# Patient Record
Sex: Female | Born: 1980 | Race: Black or African American | Hispanic: No | Marital: Married | Smoking: Never smoker
Health system: Southern US, Community
[De-identification: ages and names within clinical notes are randomized; demographics above are authoritative.]

## PROBLEM LIST (undated history)

## (undated) DIAGNOSIS — R600 Localized edema: Secondary | ICD-10-CM

## (undated) DIAGNOSIS — F419 Anxiety disorder, unspecified: Secondary | ICD-10-CM

## (undated) DIAGNOSIS — I10 Essential (primary) hypertension: Secondary | ICD-10-CM

## (undated) DIAGNOSIS — E785 Hyperlipidemia, unspecified: Secondary | ICD-10-CM

## (undated) DIAGNOSIS — E119 Type 2 diabetes mellitus without complications: Secondary | ICD-10-CM

## (undated) DIAGNOSIS — F32A Depression, unspecified: Secondary | ICD-10-CM

## (undated) DIAGNOSIS — Z6841 Body Mass Index (BMI) 40.0 and over, adult: Secondary | ICD-10-CM

## (undated) DIAGNOSIS — E669 Obesity, unspecified: Secondary | ICD-10-CM

## (undated) DIAGNOSIS — E282 Polycystic ovarian syndrome: Secondary | ICD-10-CM

## (undated) DIAGNOSIS — D649 Anemia, unspecified: Secondary | ICD-10-CM

## (undated) DIAGNOSIS — E559 Vitamin D deficiency, unspecified: Secondary | ICD-10-CM

## (undated) DIAGNOSIS — Z9884 Bariatric surgery status: Secondary | ICD-10-CM

## (undated) DIAGNOSIS — F429 Obsessive-compulsive disorder, unspecified: Secondary | ICD-10-CM

## (undated) DIAGNOSIS — I8390 Asymptomatic varicose veins of unspecified lower extremity: Secondary | ICD-10-CM

## (undated) HISTORY — DX: Obsessive-compulsive disorder, unspecified: F42.9

## (undated) HISTORY — DX: Depression, unspecified: F32.A

## (undated) HISTORY — DX: Obesity, unspecified: E66.9

## (undated) HISTORY — DX: Hyperlipidemia, unspecified: E78.5

## (undated) HISTORY — PX: LAPAROSCOPIC GASTRIC SLEEVE RESECTION: SHX5895

## (undated) HISTORY — DX: Bariatric surgery status: Z98.84

## (undated) HISTORY — DX: Essential (primary) hypertension: I10

## (undated) HISTORY — DX: Anemia, unspecified: D64.9

## (undated) HISTORY — DX: Asymptomatic varicose veins of unspecified lower extremity: I83.90

## (undated) HISTORY — DX: Anxiety disorder, unspecified: F41.9

## (undated) HISTORY — DX: Type 2 diabetes mellitus without complications: E11.9

## (undated) HISTORY — DX: Polycystic ovarian syndrome: E28.2

## (undated) HISTORY — DX: Localized edema: R60.0

## (undated) HISTORY — DX: Vitamin D deficiency, unspecified: E55.9

## (undated) HISTORY — DX: Morbid (severe) obesity due to excess calories: E66.01

## (undated) HISTORY — DX: Body Mass Index (BMI) 40.0 and over, adult: Z684

## (undated) HISTORY — PX: WISDOM TOOTH EXTRACTION: SHX21

---

## 2008-06-11 ENCOUNTER — Emergency Department (HOSPITAL_COMMUNITY): Admission: EM | Admit: 2008-06-11 | Discharge: 2008-06-12 | Payer: Self-pay | Admitting: Emergency Medicine

## 2008-11-17 ENCOUNTER — Encounter: Admission: RE | Admit: 2008-11-17 | Discharge: 2008-11-17 | Payer: Self-pay | Admitting: Internal Medicine

## 2009-04-04 ENCOUNTER — Ambulatory Visit: Payer: Self-pay | Admitting: Vascular Surgery

## 2010-06-05 NOTE — Procedures (Signed)
LOWER EXTREMITY VENOUS REFLUX EXAM   INDICATION:  Left lower extremity edema.   EXAM:  Using color-flow imaging and pulse Doppler spectral analysis, the  left common femoral, superficial femoral, popliteal, posterior tibial,  greater and lesser saphenous veins are evaluated.  There is evidence  suggesting deep venous insufficiency in the left common femoral and  popliteal veins.   The left saphenofemoral junction and greater saphenous vein are not  competent with Reflux of >552milliseconds.   The left proximal short saphenous vein demonstrates competency.   GSV Diameter (used if found to be incompetent only)                                            Right    Left  Proximal Greater Saphenous Vein           cm       0.76 cm  Proximal-to-mid-thigh                     cm       cm  Mid thigh                                 cm       0.72 cm  Mid-distal thigh                          cm       cm  Distal thigh                              cm       0.60 cm  Knee                                      cm       0.58 cm   IMPRESSION:  1. Left greater saphenous vein Reflux with >554milliseconds noted, as      described above.  2. The proximal to mid thigh left greater saphenous vein is not      tortuous.  The distal thigh and knee level left greater saphenous      vein is tortuous.  3. The left deep venous system is not competent with Reflux of      >592milliseconds, as described above.  4. The left proximal short saphenous vein demonstrates competency.            ___________________________________________  Quita Skye Hart Rochester, M.D.   CH/MEDQ  D:  04/04/2009  T:  04/05/2009  Job:  [id number]

## 2010-06-05 NOTE — Consult Note (Signed)
NEW PATIENT CONSULTATION   Kendra Nguyen, Kendra Nguyen  DOB:  May 29, 1980                                       04/04/2009  ZOXWR#:[id number]   The patient is a 30 year old female referred for venous insufficiency of  the left leg.  She states that in October of last year she began having  some occasional thigh and calf discomfort particularly around the knee  which was very sporadic and occasionally would be improved by elevation  of the legs.  She would also have some mild swelling in the ankle.  She  has had no history of DVT, thrombophlebitis, bleeding, ulceration and  has not worn elastic compression stockings nor taken pain medication.  She states that recently the symptoms have actually decreased and have  not been bad over the last few months.  She has no symptoms in the right  leg.   CHRONIC MEDICAL PROBLEMS:  1. Morbid obesity.  2. Hypertension.  3. Negative for diabetes, hyperlipidemia, coronary artery disease,      COPD or stroke.   FAMILY HISTORY:  Positive for coronary artery disease, possible  pulmonary embolus and diabetes mellitus in her mother.   SOCIAL HISTORY:  She is single, works as Pension scheme manager.  Does not use tobacco or alcohol.   REVIEW OF SYSTEMS:  Positive for weight gain.  Denies any chest pain,  dyspnea on exertion, PND, orthopnea.  No hemoptysis, bronchitis,  wheezing.  No GI or GU symptoms.  Denies any claudication.  Neurologic  and musculoskeletal systems and all systems are negative in review of  systems other than her inability to lose weight.   PHYSICAL EXAMINATION:  Vital signs:  Blood pressure is 150/78, heart  rate is 90, respirations 14, temperature 98.4.  General:  She is a well-  developed, obese female who is in no apparent distress.  She is alert  and oriented x3.  HEENT:  EOMs intact.  Conjunctivae normal.  Chest:  Clear to auscultation.  No wheezing.  Cardiovascular:  Carotid pulses  are 3+.  No bruits.   Regular rate and rhythm.  No murmurs audible.  Abdomen:  Soft, nontender with no masses.  Musculoskeletal:  Exam  reveals no major deformities.  Neurological:  Normal.  Skin:  Free of  rashes.  Lower extremity exam reveals 3+ femoral and dorsalis pedis  pulses bilaterally.  She does have an extensive network of spider veins  in the distal thigh and proximal calf medially in the left leg but no  bulging varicosities are noted.  No hyperpigmentation or ulceration  noted.   Venous duplex exam today was ordered by me and reviewed and interpreted.  She does have reflux in her left great saphenous system at the junction  and in the distal thigh.  The deep system has some mild reflux but no  DVT noted.  There is no reflux in the small saphenous system on the  left.   She does have some mild reflux but I do not think that her symptoms are  severe enough to warrant any treatment since she has no bulging  varicosities.  Also I have encouraged her to continue to work on weight  loss as she is trying to do as this will help her situation.  If she  develops bulging varicosities or has increasing symptoms over the years  then studies  should be repeated at that time.     Quita Skye Hart Rochester, M.D.  Electronically Signed   JDL/MEDQ  D:  04/04/2009  T:  04/05/2009  Job:  [id number]   cc:   Merlene Laughter. Renae Gloss, M.D.

## 2011-03-21 ENCOUNTER — Ambulatory Visit (INDEPENDENT_AMBULATORY_CARE_PROVIDER_SITE_OTHER): Payer: BC Managed Care – PPO | Admitting: Family Medicine

## 2011-03-21 ENCOUNTER — Ambulatory Visit: Payer: BC Managed Care – PPO

## 2011-03-21 DIAGNOSIS — E669 Obesity, unspecified: Secondary | ICD-10-CM

## 2011-03-21 DIAGNOSIS — I10 Essential (primary) hypertension: Secondary | ICD-10-CM | POA: Insufficient documentation

## 2011-03-21 DIAGNOSIS — R079 Chest pain, unspecified: Secondary | ICD-10-CM

## 2011-03-21 MED ORDER — DOXYCYCLINE HYCLATE 100 MG PO CAPS: 100.0000 mg | ORAL_CAPSULE | Freq: Two times a day (BID) | ORAL | Status: AC

## 2011-03-21 NOTE — Progress Notes (Signed)
Patient Name: Kendra Nguyen Date of Birth: June 25, 1980 Medical Record Number: [id number] Gender: female Date of Encounter: 03/21/2011  History of Present Illness:  Kendra Nguyen is a 31 y.o. very pleasant female patient who presents with the following:  Here today to evaluate chest pain.  On Monday did Zumba class and noted chest discomfort for a few minutes at the start of class- wondered if it was due to moving her arms around a lot.  It went away and she continued the class without any problem. That night noted pain that occurred with position change like when she tried to get out of bed.  Yesterday afternoon she noted the pain again while she was sitting in a meeting.  The pain lasted about 5 minutes, then came back on and off yesterday.  Today notes the pain only if she moves a certain way- it feels like a "pull." Occurs if she reaches out with her right arm Also has been coughing some lately- notes that she coughs up "some stuff" which she attributes to sinus drainage.  She was treeated elsewhere about a month ago with a zpack but this has not gone away No fever, unsure about chills.   No SOB Wants a chest xray.  LMP at the start of February.  Also notes "random pains" all over her body for the last several years.   In the fall of 2010 she had an echo and visit with cardiology for atypical CP.  No stress test was done due to very low likelihood of cardiac disease.  She does have a brother who had an MI at age 17, but Jayd notes that there was "something else going on with him" that was through to cause the MI Not a smoker.   Is having a lap band next month! Patient Active Problem List  Diagnoses  . Morbid obesity  . Hypertension   History reviewed. No pertinent past medical history. Past Surgical History  Procedure Date  . Ankle fracture surgery    History  Substance Use Topics  . Smoking status: Never Smoker   . Smokeless tobacco: Never Used  . Alcohol Use: Not on file    History reviewed. No pertinent family history. Allergies  Allergen Reactions  . Neomycin Rash    Medication list has been reviewed and updated.  Review of Systems: As per HPI- otherwise negative.   Physical Examination: Filed Vitals:   03/21/11 1740  BP: 113/72  Pulse: 85  Temp: 98.3 F (36.8 C)  TempSrc: Oral  Resp: 16  Height: 5\' 7"  (1.702 m)  Weight: 329 lb (149.233 kg)    Body mass index is 51.53 kg/(m^2).  Chest x-ray- negtive EKG: normal sinus rhythm, no ST elevation or depression, compared to old EKG and no significant change GEN: WDWN, NAD, Non-toxic, A & O x 3 HEENT: Atraumatic, Normocephalic. Neck supple. No masses, No LAD. Ears and Nose: No external deformity. CV: RRR, No M/G/R. No JVD. No thrill. No extra heart sounds. PULM: CTA B, no wheezes, crackles, rhonchi. No retractions. No resp. distress. No accessory muscle use. Chest wall: somewhat able to reproduce her tenderness with pushing on chest.  No rashes or lesions.  ABD: S, NT, ND, +BS. No rebound. No HSM. EXTR: No c/c/e NEURO Normal gait.  PSYCH: Normally interactive. Conversant. Not depressed or anxious appearing.  Calm demeanor.    Assessment and Plan: 1. Chest pain  EKG 12-Lead, DG Chest 2 View, doxycycline (VIBRAMYCIN) 100 MG capsule  2. Morbid obesity  3. Hypertension      Discussed CP in detail with patient.  Due to her age, lack of current CP and normal EKG I do not feel that she needs to go to the ED.  Her CP is atypical and she did have a cardiology evaluation in 2010.  She has not had a stress test or cath, but we discussed how a heart cath could have adverse effects, and how a stress could lead to a cath.  For the time being she will see if this resolves in the next couple of days. If it does not she will come in and see [place]- she will seek care at the ED if her CP becomes acutely worse or lasts.    She also has complaint of persistent cough which is productive despite treatment with a  zpack about a month ago.  Will try a course of doxycycine- let us know if not better or if worse

## 2011-04-08 ENCOUNTER — Encounter: Payer: Self-pay | Admitting: Obstetrics and Gynecology

## 2011-04-17 HISTORY — PX: LAPAROSCOPIC GASTRIC BANDING: SHX1100

## 2011-05-13 ENCOUNTER — Ambulatory Visit (INDEPENDENT_AMBULATORY_CARE_PROVIDER_SITE_OTHER): Payer: BC Managed Care – PPO | Admitting: Obstetrics and Gynecology

## 2011-05-13 ENCOUNTER — Encounter: Payer: Self-pay | Admitting: Obstetrics and Gynecology

## 2011-05-13 VITALS — BP 118/78 | Temp 98.4°F | Resp 16 | Ht 64.0 in | Wt 310.0 lb

## 2011-05-13 DIAGNOSIS — Z01419 Encounter for gynecological examination (general) (routine) without abnormal findings: Secondary | ICD-10-CM

## 2011-05-13 DIAGNOSIS — Z113 Encounter for screening for infections with a predominantly sexual mode of transmission: Secondary | ICD-10-CM

## 2011-05-13 DIAGNOSIS — Z124 Encounter for screening for malignant neoplasm of cervix: Secondary | ICD-10-CM

## 2011-05-13 LAB — RPR

## 2011-05-13 NOTE — Patient Instructions (Signed)

## 2011-05-13 NOTE — Progress Notes (Signed)
Patient ID: Samar Venneman, female   DOB: [birth year], 31 y.o.   MRN: [id number] .Allergies: Neomycin LMP: April 25, 2011 Last Pap: 03/2010- Normal Contraception: Abstinent Gravida: 0 Para: 0 Primary Doctor: Dr. Kendell Bane EXAM  Regular Periods yes   Monthly Breast Ex. no Tetanus<10 years yes NI. Bladder Function yes Daily BM's yes Healthy Diet yes Calcium/Multi-vitamin no Mammogram no Date: Smoker no How much? Alcohol Abuse no How much? Substance Abuse no How much? Exercise yes How much?  3x's a week Seatbelts yes Abuse at Home no Stressful Work yes Sigmoid/Colonoscopy in past 5 years no Medications:  Medical problems this year: No  PMH: High blood pressure & Lapband surgery  FMH: Sister had hysterectomy  ABDOMINAL/PELVIC PAIN no  VAGINAL DISCHARGE no  IRREGULAR BLEEDING no  MENOPAUSAL SYMPTOMS no

## 2011-05-14 NOTE — Progress Notes (Signed)
Kendra Nguyen is an 32 y.o. female. G 0 P 0-0-0-0.  She has been followed at the [facility]. New GYN exam. Not sexually active.  Pertinent Gynecological History: Menses: flow is moderate Bleeding: reg Contraception: abstinence DES exposure: unknown Blood transfusions: none Sexually transmitted diseases: no past history Previous GYN Procedures: none  Last mammogram: na Date: na Last pap: normal   Menstrual History: Menarche age: normal     Past Medical History  Diagnosis Date  . Hypertension   . Hx of laparoscopic gastric banding     Past Surgical History  Procedure Date  . Ankle fracture surgery     No family history on file.  Social History:  reports that she has never smoked. She has never used smokeless tobacco. She reports that she does not drink alcohol or use illicit drugs.  Allergies:  Allergies  Allergen Reactions  . Neomycin Rash    ROS: see hpi   Blood pressure 118/78, temperature 98.4 F (36.9 C), resp. rate 16, height 5\' 4"  (1.626 m), weight 310 lb (140.615 kg). Physical Examination: General appearance - alert, well appearing, and in no distress Neck - supple, no significant adenopathy Chest - clear to auscultation, no wheezes, rales or rhonchi, symmetric air entry Heart - normal rate, regular rhythm, normal S1, S2, no murmurs, rubs, clicks or gallops Abdomen - soft, nontender, nondistended, no masses or organomegaly Breasts - breasts appear normal, no suspicious masses, no skin or nipple changes or axillary nodes Pelvic - normal external genitalia, vulva, vagina, cervix, uterus and adnexa, VULVA: normal appearing vulva with no masses, tenderness or lesions, VAGINA: normal appearing vagina with normal color and discharge, no lesions, CERVIX: normal appearing cervix without discharge or lesions, UTERUS: uterus is normal size, shape, consistency and nontender, ADNEXA: normal adnexa in size, nontender  and no masses Neurological - alert, oriented, normal speech, no focal findings or movement disorder noted Extremities - peripheral pulses normal, no pedal edema, no clubbing or cyanosis  Assessment/Plan: Normal gyn exam R/O STD Obesity Htn S/P gatric bypass surgery  Pap GC,CHL,HBsAG, HSV1 and 2, HIV, RPR Contraception discussed RTO 1 year.  Janine Limbo 05/14/2011, 10:47 PM

## 2011-05-15 LAB — PAP IG, CT-NG, RFX HPV ASCU
Chlamydia Probe Amp: NEGATIVE
GC Probe Amp: NEGATIVE

## 2011-05-28 ENCOUNTER — Telehealth: Payer: Self-pay | Admitting: Obstetrics and Gynecology

## 2011-05-28 NOTE — Telephone Encounter (Signed)
Triage/electronic lab res

## 2011-05-29 ENCOUNTER — Telehealth: Payer: Self-pay

## 2011-05-29 NOTE — Telephone Encounter (Signed)
Pt was called and made aware that all Labs came back negative.

## 2011-05-29 NOTE — Telephone Encounter (Signed)
AVS PT;SEE NOTE

## 2011-06-11 ENCOUNTER — Ambulatory Visit: Payer: BC Managed Care – PPO

## 2011-07-08 ENCOUNTER — Ambulatory Visit (INDEPENDENT_AMBULATORY_CARE_PROVIDER_SITE_OTHER): Payer: BC Managed Care – PPO | Admitting: Emergency Medicine

## 2011-07-08 VITALS — BP 127/77 | HR 59 | Temp 98.0°F | Resp 16 | Ht 66.0 in | Wt 294.0 lb

## 2011-07-08 DIAGNOSIS — B372 Candidiasis of skin and nail: Secondary | ICD-10-CM

## 2011-07-08 MED ORDER — FLUCONAZOLE 100 MG PO TABS: 100.0000 mg | ORAL_TABLET | Freq: Every day | ORAL | Status: AC

## 2011-07-08 NOTE — Progress Notes (Signed)
     Patient Name: Kendra Nguyen Date of Birth: [birth year] Medical Record Number: [id number] Gender: female Date of Encounter: 07/08/2011  History of Present Illness:  Kendra Nguyen is a 31 y.o. very pleasant female patient who presents with the following:  Recurrent pruritic rash under left arm associated with redundant skin folds following large weight loss  Patient Active Problem List  Diagnosis  . Morbid obesity  . Hypertension   Past Medical History  Diagnosis Date  . Hypertension   . Hx of laparoscopic gastric banding    Past Surgical History  Procedure Date  . Ankle fracture surgery    History  Substance Use Topics  . Smoking status: Never Smoker   . Smokeless tobacco: Never Used  . Alcohol Use: No   No family history on file. Allergies  Allergen Reactions  . Neomycin Rash    Medication list has been reviewed and updated.  Prior to Admission medications   Medication Sig Start Date End Date Taking? Authorizing Provider  olmesartan-hydrochlorothiazide (BENICAR HCT) 20-12.5 MG per tablet Take 1 tablet by mouth daily.   Yes Historical Provider, MD  nebivolol (BYSTOLIC) 10 MG tablet Take 10 mg by mouth daily.    Historical Provider, MD    Review of Systems:  As per HPI, otherwise negative.    Physical Examination: Filed Vitals:   07/08/11 1224  BP: 127/77  Pulse: 59  Temp: 98 F (36.7 C)  Resp: 16   Filed Vitals:   07/08/11 1224  Height: 5\' 6"  (1.676 m)  Weight: 294 lb (133.358 kg)   Body mass index is 47.45 kg/(m^2). Ideal Body Weight: Weight in (lb) to have BMI = 25: 154.6    GEN: WDWN, NAD, Non-toxic, Alert & Oriented x 3 HEENT: Atraumatic, Normocephalic.  Ears and Nose: No external deformity. EXTR: No clubbing/cyanosis/edema.  Multiple discrete lesions surrounding larger erythematous area  NEURO: Normal gait.  PSYCH: Normally interactive. Conversant. Not depressed or anxious appearing.  Calm demeanor.   Assessment and  Plan: Candidal intertrigo treated with diflucan Follow up as needed  Carmelina Dane, MD

## 2011-07-18 ENCOUNTER — Encounter: Payer: Self-pay | Admitting: Physician Assistant

## 2011-07-18 ENCOUNTER — Ambulatory Visit (INDEPENDENT_AMBULATORY_CARE_PROVIDER_SITE_OTHER): Payer: BC Managed Care – PPO | Admitting: Physician Assistant

## 2011-07-18 VITALS — BP 115/76 | HR 71 | Temp 98.2°F | Resp 16 | Ht 66.5 in | Wt 294.2 lb

## 2011-07-18 DIAGNOSIS — L309 Dermatitis, unspecified: Secondary | ICD-10-CM

## 2011-07-18 DIAGNOSIS — L259 Unspecified contact dermatitis, unspecified cause: Secondary | ICD-10-CM

## 2011-07-18 DIAGNOSIS — L738 Other specified follicular disorders: Secondary | ICD-10-CM

## 2011-07-18 DIAGNOSIS — L678 Other hair color and hair shaft abnormalities: Secondary | ICD-10-CM

## 2011-07-18 DIAGNOSIS — L739 Follicular disorder, unspecified: Secondary | ICD-10-CM

## 2011-07-18 MED ORDER — DOXYCYCLINE HYCLATE 100 MG PO CAPS: 100.0000 mg | ORAL_CAPSULE | Freq: Two times a day (BID) | ORAL | Status: AC

## 2011-07-18 NOTE — Progress Notes (Signed)
  Subjective:    Patient ID: Kendra Nguyen, female    DOB: 1980/06/21, 31 y.o.   MRN: [id number]  HPI Presents with worsening rash and itching in the right left axilla.  Seen 10 days ago and diagnosed with candida intertrigo and given Diflucan PO.  Seems to be spreading.  Deodorant burns when applied.  New lesions are developing in the axilla, and extending onto the left breast and upper back.  She is working hard at losing weight.  Has lost 40 pounds since gastric banding in March.  Exercises daily.  Has developed areas of excess skin, especially in the upper arms.  Review of Systems No fever, chills, no drainage from lesions.    Objective:   Physical Exam Vital signs noted. Well-developed, well nourished BF who is awake, alert and oriented, in NAD. HEENT: [place]/AT, sclera and conjunctiva are clear.  Skin: warm and dry.  Two different lesion types noted in the left axilla.  One is scaled, plaques, consistent with cutaneous candidiasis intertrigo.  The other is small erythematous papules and pustules that are scattered around the axilla and spreads onto the lateral breast and back.  No induration.  No fluctuence.     Assessment & Plan:   1. Dermatitis    2. Folliculitis  doxycycline (VIBRAMYCIN) 100 MG capsule   Continue the fluconazole to completion.  Dicussed ways to keep the area cool and dry and to reduce friction.

## 2011-07-18 NOTE — Patient Instructions (Signed)
Complete the Diflucan (fluconazole) and ADD the doxycycline. Try a product called Glide in the areas that chafe and rub during exercise.

## 2011-10-17 ENCOUNTER — Ambulatory Visit (INDEPENDENT_AMBULATORY_CARE_PROVIDER_SITE_OTHER): Payer: BC Managed Care – PPO | Admitting: Emergency Medicine

## 2011-10-17 ENCOUNTER — Telehealth: Payer: Self-pay | Admitting: Radiology

## 2011-10-17 ENCOUNTER — Ambulatory Visit (HOSPITAL_COMMUNITY)
Admission: RE | Admit: 2011-10-17 | Discharge: 2011-10-17 | Disposition: A | Discharge status: Home / Self Care | Payer: BC Managed Care – PPO | Source: Ambulatory Visit | Attending: Emergency Medicine | Admitting: Emergency Medicine

## 2011-10-17 VITALS — BP 128/74 | HR 88 | Temp 98.5°F | Resp 17 | Ht 66.0 in | Wt 293.0 lb

## 2011-10-17 DIAGNOSIS — M79606 Pain in leg, unspecified: Secondary | ICD-10-CM

## 2011-10-17 DIAGNOSIS — M79609 Pain in unspecified limb: Secondary | ICD-10-CM | POA: Insufficient documentation

## 2011-10-17 DIAGNOSIS — M7989 Other specified soft tissue disorders: Secondary | ICD-10-CM | POA: Insufficient documentation

## 2011-10-17 DIAGNOSIS — I10 Essential (primary) hypertension: Secondary | ICD-10-CM

## 2011-10-17 NOTE — Progress Notes (Signed)
  Date:  10/17/2011   Name:  Kendra Nguyen   DOB:  14-Aug-1980   MRN:  [id number] Gender: female Age: 31 y.o.  PCP:  Alva Garnet., MD    Chief Complaint: Leg Pain   History of Present Illness:  Kendra Nguyen is a 31 y.o. pleasant patient who presents with the following:  Multiple concerns related to cardiovascular system.  Says that she has been diagnosed with varicose veins and is concerned about them as her mother died of a PE.  Not using recommended support hose.  Underwent gastric surgery for morbid obesity and has lost 50 pounds.  Denies shortness of breath or chest pain.  No peripheral edema.  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  No other risks for DVT/PE  Patient Active Problem List  Diagnosis  . Morbid obesity  . Hypertension    Past Medical History  Diagnosis Date  . Hypertension   . Hx of laparoscopic gastric banding     Past Surgical History  Procedure Date  . Ankle fracture surgery   . Laparoscopic gastric banding 04/17/2011    History  Substance Use Topics  . Smoking status: Never Smoker   . Smokeless tobacco: Never Used  . Alcohol Use: No    Family History  Problem Relation Age of Onset  . Diabetes Mother   . Heart disease Mother   . Arthritis Father     gout  . COPD Father     smoker  . Hyperlipidemia Father   . Aneurysm Sister 34    cerebral; some memory loss  . COPD Paternal Aunt     Allergies  Allergen Reactions  . Neomycin Rash    Medication list has been reviewed and updated.  Outpatient Prescriptions Prior to Visit  Medication Sig Dispense Refill  . olmesartan-hydrochlorothiazide (BENICAR HCT) 20-12.5 MG per tablet Take 1 tablet by mouth daily.      . Nebivolol HCl (BYSTOLIC) 20 MG TABS Take 20 mg by mouth daily.        Review of Systems:  As per HPI, otherwise negative.    Physical Examination: Filed Vitals:   10/17/11 1157  BP: 128/74  Pulse: 88  Temp: 98.5 F (36.9 C)  Resp: 17   Filed  Vitals:   10/17/11 1157  Height: 5\' 6"  (1.676 m)  Weight: 293 lb (132.904 kg)   Body mass index is 47.29 kg/(m^2). Ideal Body Weight: Weight in (lb) to have BMI = 25: 154.6   GEN: WDWN, NAD, Non-toxic, A & O x 3 HEENT: Atraumatic, Normocephalic. Neck supple. No masses, No LAD. Ears and Nose: No external deformity. CV: RRR, No M/G/R. No JVD. No thrill. No extra heart sounds. PULM: CTA B, no wheezes, crackles, rhonchi. No retractions. No resp. distress. No accessory muscle use. ABD: S, NT, ND, +BS. No rebound. No HSM. EXTR: No c/c/e NEURO Normal gait.  PSYCH: Normally interactive. Conversant. Not depressed or anxious appearing.  Calm demeanor.  LEGS:  No calf or thigh tenderness.  No edema.  Assessment and Plan: Leg pain Morbid obesity R/o DVT Venous doppler  Carmelina Dane, MD I have reviewed and agree with documentation. Robert P. Merla Riches, M.D.

## 2011-10-17 NOTE — Telephone Encounter (Signed)
Vascular lab called patients study was negative for DVT. Patient has been advised it is negative and she has been released home. Advised we will call with further directions. Please advise.

## 2011-10-17 NOTE — Patient Instructions (Addendum)
[facility] hospital can do the venous doppler scan bilateral to r/o blood clot you can go there at 2:45pm first floor admitting, for the study to be done at 3pm Then to vascular lab.    Driving directions to [facility] [id number]  [phone]  - more info    [address]  [place], [place] [address]     1. Head south on [address] toward [address]      0.5 mi    2. Sharp left onto [address]      0.6 mi    3. Turn left onto the [address] ramp      0.2 mi    4. Merge onto [address]      3.0 mi    5. Continue straight to stay on [address]      0.4 mi    6. Slight left to stay on [address]      1.2 mi    7. Turn right onto [address]      0.1 mi    8. Turn left onto [address]      361 ft    9. Take the 1st left onto [facility]  Destination will be on the right

## 2011-10-17 NOTE — Progress Notes (Signed)
VASCULAR LAB PRELIMINARY  PRELIMINARY  PRELIMINARY  PRELIMINARY  Bilateral lower extremity venous duplex completed.    Preliminary report:  Bilateral:  No evidence of DVT, superficial thrombosis, or Baker's Cyst.   Maxi Rodas, RVS   10/17/2011, 3:55 PM

## 2011-10-18 ENCOUNTER — Telehealth: Payer: Self-pay | Admitting: Radiology

## 2011-10-18 NOTE — Telephone Encounter (Signed)
Patient advised she is better now as compared to yesterday. She did know her scan was negative. She states she will take ibuprofen or aleve as Dr Dareen Piano has reccommended

## 2012-03-05 ENCOUNTER — Emergency Department: Payer: Self-pay | Admitting: Emergency Medicine

## 2012-03-05 LAB — URINALYSIS, COMPLETE
Bilirubin,UR: NEGATIVE
Blood: NEGATIVE
Nitrite: NEGATIVE
Ph: 6 (ref 4.5–8.0)
Protein: NEGATIVE
Squamous Epithelial: 1
WBC UR: 1 /HPF (ref 0–5)

## 2012-03-05 LAB — COMPREHENSIVE METABOLIC PANEL
Albumin: 3.9 g/dL (ref 3.4–5.0)
Anion Gap: 9 (ref 7–16)
Bilirubin,Total: 1.2 mg/dL — ABNORMAL HIGH (ref 0.2–1.0)
Calcium, Total: 8.7 mg/dL (ref 8.5–10.1)
Chloride: 108 mmol/L — ABNORMAL HIGH (ref 98–107)
Creatinine: 0.65 mg/dL (ref 0.60–1.30)
EGFR (African American): >60
EGFR (Non-African Amer.): >60
Glucose: 91 mg/dL (ref 65–99)
Potassium: 3.8 mmol/L (ref 3.5–5.1)
SGOT(AST): 22 U/L (ref 15–37)
Sodium: 137 mmol/L (ref 136–145)

## 2012-03-05 LAB — CBC
HCT: 40.9 % (ref 35.0–47.0)
MCH: 29.2 pg (ref 26.0–34.0)
MCHC: 33.1 g/dL (ref 32.0–36.0)
MCV: 88 fL (ref 80–100)
Platelet: 291 10*3/uL (ref 150–440)
RBC: 4.64 10*6/uL (ref 3.80–5.20)
RDW: 12.8 % (ref 11.5–14.5)
WBC: 9.7 10*3/uL (ref 3.6–11.0)

## 2012-03-05 LAB — TROPONIN I: Troponin-I: <0.02 ng/mL

## 2012-06-19 ENCOUNTER — Ambulatory Visit (INDEPENDENT_AMBULATORY_CARE_PROVIDER_SITE_OTHER): Payer: BC Managed Care – PPO | Admitting: Physician Assistant

## 2012-06-19 VITALS — BP 130/80 | HR 73 | Temp 98.3°F | Resp 20 | Ht 65.0 in | Wt 308.6 lb

## 2012-06-19 DIAGNOSIS — Z789 Other specified health status: Secondary | ICD-10-CM

## 2012-06-19 DIAGNOSIS — Z23 Encounter for immunization: Secondary | ICD-10-CM

## 2012-06-19 MED ORDER — ATOVAQUONE-PROGUANIL HCL 250-100 MG PO TABS: 1.0000 | ORAL_TABLET | Freq: Every day | ORAL | Status: DC

## 2012-06-19 MED ORDER — TYPHOID VACCINE PO CPDR: 1.0000 | DELAYED_RELEASE_CAPSULE | ORAL | Status: DC

## 2012-06-19 NOTE — Progress Notes (Signed)
  Subjective:    Patient ID: Kendra Nguyen, female    DOB: [birth year], 32 y.o.   MRN: [id number]  HPI   Ms. Kendra Nguyen is a very pleasant 32 yr old female here to update immunizations prior to international travel.  She does not bring records with her and is not aware of her immunization status.  States she was here about 2 yrs ago for immunizations but is not sure if she completed the full series of vaccines.  She will be traveling to the [place] in 7 days.  In July she will be traveling to [place] and [place].  Per CDC recommendations, pt should have hep A, hep B, typhoid, and malaria ppx for travel to [place].  Additionally will need meningitis and yellow fever for trip to [place] since she will be traveling from [place] into [place].  Per paper chart, pt received 1 dose hep A and 2 doses of hep B in 2012.  Also received a tdap at that time.   Review of Systems  All other systems reviewed and are negative.       Objective:   Physical Exam  Vitals reviewed. Constitutional: She is oriented to person, place, and time. She appears well-developed and well-nourished. No distress.  HENT:  Head: Normocephalic and atraumatic.  Eyes: Conjunctivae are normal. No scleral icterus.  Cardiovascular: Normal rate, regular rhythm and normal heart sounds.   Pulmonary/Chest: Effort normal.  Neurological: She is alert and oriented to person, place, and time.  Skin: Skin is warm and dry.  Psychiatric: She has a normal mood and affect. Her behavior is normal.        Assessment & Plan:  Foreign travel - Plan: atovaquone-proguanil (MALARONE) 250-100 MG TABS, Hepatitis A vaccine adult IM, Hepatitis B vaccine adult IM, Meningococcal conjugate vaccine 4-valent IM, typhoid (VIVOTIF) DR capsule, atovaquone-proguanil (MALARONE) 250-100 MG TABS, DISCONTINUED: typhoid (VIVOTIF) DR capsule, DISCONTINUED: atovaquone-proguanil (MALARONE) 250-100 MG TABS  Need for hepatitis B vaccination - Plan: Hepatitis B  vaccine adult IM  Need for prophylactic vaccination and inoculation against viral hepatitis - Plan: Hepatitis A vaccine adult IM  Need for malaria vaccination - Plan: atovaquone-proguanil (MALARONE) 250-100 MG TABS, atovaquone-proguanil (MALARONE) 250-100 MG TABS, DISCONTINUED: atovaquone-proguanil (MALARONE) 250-100 MG TABS  Need for meningococcal vaccination - Plan: Meningococcal conjugate vaccine 4-valent IM  Need for immunization against typhoid - Plan: typhoid (VIVOTIF) DR capsule, DISCONTINUED: typhoid (VIVOTIF) DR capsule   Ms. Kendra Nguyen is a very pleasant 32 yr old female here for immunization update prior to international travel.    -Completed hep A and hep B series today.    -Meningococcal vaccine given as pt will be travelling in [place].    -Will start oral typhoid vaccine today - ideally pt would have completed the course 1 wk prior to travel, but pt is leaving for the [place] 1 wk from today.   - Will start malaria ppx 1-2 days before travel to [place].  I have printed an additional Malarone rx for travel to [place] in July.    -Pt is utd on tdap.    -Discussed options for yellow fever vaccine as pt will require it to travel from [place] to [place] - pt has contacted both travel med clinic and health dept, has concerns over cost.  Explained that we do not stock the vaccine and that she will have to discuss cost with her ins company.  Pt understands this and will complete before travel in July.

## 2012-06-19 NOTE — Patient Instructions (Signed)
Begin taking the typhoid (Vivotif) vaccine today - take today (day 1), then day 3, day 5, and day 7.  This lasts for 5 years.  Begin taking the [place] 1-2 days prior to traveling to [place]  Take daily while there, and continue daily for 7 days after you return.  Same for the [place] trip.  We have updated hepatitis b, hepatitis a, and meningitis vaccines today.  You are up to date on tetanus.  Make sure you get the yellow fever vaccine before

## 2012-07-02 ENCOUNTER — Ambulatory Visit (INDEPENDENT_AMBULATORY_CARE_PROVIDER_SITE_OTHER): Payer: BC Managed Care – PPO | Admitting: Physician Assistant

## 2012-07-02 VITALS — BP 129/69 | HR 76 | Temp 98.9°F | Resp 18 | Ht 67.0 in | Wt 308.0 lb

## 2012-07-02 DIAGNOSIS — T169XXA Foreign body in ear, unspecified ear, initial encounter: Secondary | ICD-10-CM

## 2012-07-02 DIAGNOSIS — T162XXA Foreign body in left ear, initial encounter: Secondary | ICD-10-CM

## 2012-07-02 DIAGNOSIS — H9209 Otalgia, unspecified ear: Secondary | ICD-10-CM

## 2012-07-02 DIAGNOSIS — H9202 Otalgia, left ear: Secondary | ICD-10-CM

## 2012-07-02 MED ORDER — OFLOXACIN 0.3 % OT SOLN: 5.0000 [drp] | Freq: Every day | OTIC | Status: DC

## 2012-07-02 MED ORDER — AMOXICILLIN 500 MG PO CAPS: 1000.0000 mg | ORAL_CAPSULE | Freq: Three times a day (TID) | ORAL | Status: DC

## 2012-07-02 NOTE — Progress Notes (Signed)
  Subjective:    Patient ID: Kendra Nguyen, female    DOB: March 25, 1980, 32 y.o.   MRN: [id number]  HPI 32 year old female presents with left ear pain for 3 days. States symptoms started as a sort of pressure/pain and now have worsened.  She was in the [place] for a mission trip and believes she may have gotten sand in her ear.  States since then it has been progressively worsening. She has tried to flush at home but admits that it feels like that is making her symptoms worse.  Denies nasal congestion, rhinorrhea, cough, fever, chills, headache, or dizziness.   Patient otherwise doing well with no other concerns today.     Review of Systems  Constitutional: Negative for fever and chills.  HENT: Positive for ear pain (left). Negative for congestion, rhinorrhea and postnasal drip.   Respiratory: Negative for cough, shortness of breath and wheezing.   Gastrointestinal: Negative for nausea and vomiting.  Neurological: Negative for dizziness and headaches.       Objective:   Physical Exam  Constitutional: She is oriented to person, place, and time. She appears well-developed and well-nourished.  HENT:  Head: Normocephalic and atraumatic.  Right Ear: Hearing, tympanic membrane, external ear and ear canal normal.  Left Ear: Hearing and external ear normal. No drainage or swelling. Tympanic membrane is erythematous.  Left canal has several small pieces of shell with one slightly larger piece flush against TM.  Ear gently irrigated and copious amounts of sand and shell removed.   Cardiovascular: Normal rate, regular rhythm and normal heart sounds.   Pulmonary/Chest: Effort normal and breath sounds normal.  Neurological: She is alert and oriented to person, place, and time.  Psychiatric: She has a normal mood and affect. Her behavior is normal. Judgment and thought content normal.     Patient reports moderate amount of improvement after irrigation.  Still feels full and has some  pain, but overall there was improvement.      Assessment & Plan:   Otalgia, left  Foreign body in ear, left, initial encounter  Floxin otic 5 drops daily x 7 days Will cover with amoxicillin since TM is erythematous - this could be due to irritation from debris and irrigation, but since pt symptomatic will treat Follow up if symptoms worsen or fail to improve.

## 2012-07-10 ENCOUNTER — Encounter: Payer: BC Managed Care – PPO | Admitting: Internal Medicine

## 2012-07-13 ENCOUNTER — Telehealth: Payer: Self-pay

## 2012-07-13 NOTE — Telephone Encounter (Signed)
Ok to do hep B #2 1 wk early

## 2012-07-13 NOTE — Telephone Encounter (Signed)
PATIENT STATES SHE IS TO HAVE HER 2ND HEP. B ON NEXT Monday 07/20/12. SHE WOULD LIKE TO COME IN TODAY FOR ANOTHER ISSUE, BUT WANTS TO KNOW IF SHE CAN HAVE HER 2ND HEP. B DONE A WEEK EARLY? SHE DOES NOT LIVE IN [place] AND WOULD LIKE TO GET EVERYTHING DONE AT ONE TIME IF POSSIBLE. BEST PHONE [phone] (CELL)  MBC

## 2012-07-14 NOTE — Telephone Encounter (Signed)
Called her to advise.  

## 2012-07-28 ENCOUNTER — Telehealth: Payer: Self-pay

## 2012-07-28 NOTE — Telephone Encounter (Signed)
Ready for pickup. Left message on machine. °

## 2012-07-28 NOTE — Telephone Encounter (Signed)
Pt needs a copy of any immunizations we have for her.  She is going out of the country on Thursday and wants to come by tomorrow to pick this up. [phone]

## 2013-12-01 ENCOUNTER — Ambulatory Visit (INDEPENDENT_AMBULATORY_CARE_PROVIDER_SITE_OTHER): Payer: BC Managed Care – PPO | Admitting: Physician Assistant

## 2013-12-01 VITALS — BP 122/68 | HR 74 | Temp 98.4°F | Resp 16 | Ht 68.0 in | Wt 337.2 lb

## 2013-12-01 DIAGNOSIS — J029 Acute pharyngitis, unspecified: Secondary | ICD-10-CM

## 2013-12-01 DIAGNOSIS — R05 Cough: Secondary | ICD-10-CM

## 2013-12-01 DIAGNOSIS — R059 Cough, unspecified: Secondary | ICD-10-CM

## 2013-12-01 DIAGNOSIS — J3489 Other specified disorders of nose and nasal sinuses: Secondary | ICD-10-CM

## 2013-12-01 MED ORDER — BENZONATATE 100 MG PO CAPS: 100.0000 mg | ORAL_CAPSULE | Freq: Three times a day (TID) | ORAL | Status: DC | PRN

## 2013-12-01 MED ORDER — GUAIFENESIN ER 1200 MG PO TB12: 1.0000 | ORAL_TABLET | Freq: Two times a day (BID) | ORAL | Status: DC | PRN

## 2013-12-01 MED ORDER — IPRATROPIUM BROMIDE 0.03 % NA SOLN: 2.0000 | Freq: Two times a day (BID) | NASAL | Status: DC

## 2013-12-01 MED ORDER — HYDROCODONE-HOMATROPINE 5-1.5 MG/5ML PO SYRP: 5.0000 mL | ORAL_SOLUTION | Freq: Three times a day (TID) | ORAL | Status: DC | PRN

## 2013-12-01 NOTE — Patient Instructions (Addendum)
Your symptoms are most likely due to a viral upper respiratory infection. Please take the hycodan and tessalon as needed for cough. The hycodan can make you sleepy so please use the hycodan once at night to help you sleep, then use the tessalon once during the day for cough.  Mucinex twice per day to help cough up phlegm. Atrovent nasal spray 2 sprays in each nostril, twice per day.  Tylenol every 4-5 hours for throat pain. Be sure to get plenty of rest, drink plenty of fluids.  Return to clinic in 5 days if symptoms are not improving.

## 2013-12-01 NOTE — Progress Notes (Signed)
Subjective:    Patient ID: Kendra Nguyen, female    DOB: 1980-11-24, 33 y.o.   MRN: [id number]  Salena Saner., MD  Chief Complaint  Patient presents with  . Sore Throat    x5 days; hurts to swallow  . Cough    dark green sputum  . Nasal Congestion  . Ear Pain    both ears; more of discomfort   Patient Active Problem List   Diagnosis Date Noted  . Morbid obesity 03/21/2011   Medications, allergies, past medical history, surgical history, family history, social history and problem list reviewed and updated.  HPI  33 yof with no pertinent PMH presents with 5 day h/o sore throat, cough, and nasal congestion.  She initially got a scratchy throat 5 days ago. The next day she started experiencing runny nose/head congestion/and a nonproductive cough. Cough occurs throughout the day, is described as a tickle in her throat, and has kept her up at night past 2 nights. It was productive 2 days ago with green sputum but otherwise nonproductive.   Sore throat has improved past 1-2 days. She had been around sick friends 1 week ago.   No fevers, chills, dysuria, HA, abd pain, N/V, or diarrhea. She hasn't taken anything for the sickness.  Sick couple wks ago. Hx post nasal drip.   Review of Systems No CP, SOB. See HPI.     Objective:   Physical Exam  Constitutional: She is oriented to person, place, and time. She appears well-developed and well-nourished.  Non-toxic appearance. She does not have a sickly appearance. She does not appear ill. No distress.  BP 122/68 mmHg  Pulse 74  Temp(Src) 98.4 F (36.9 C) (Oral)  Resp 16  Ht 5\' 8"  (1.727 m)  Wt 337 lb 3.2 oz (152.953 kg)  BMI 51.28 kg/m2  SpO2 99%  LMP 10/31/2013   HENT:  Head: Normocephalic and atraumatic.  Right Ear: Hearing, tympanic membrane, external ear and ear canal normal.  Left Ear: Hearing, tympanic membrane, external ear and ear canal normal.  Nose: Rhinorrhea present. No mucosal edema. Right sinus  exhibits no maxillary sinus tenderness and no frontal sinus tenderness. Left sinus exhibits no maxillary sinus tenderness and no frontal sinus tenderness.  Mouth/Throat: Uvula is midline and oropharynx is clear and moist. No oropharyngeal exudate, posterior oropharyngeal edema or posterior oropharyngeal erythema.  Cardiovascular: Normal rate, regular rhythm and normal heart sounds.  Exam reveals no gallop.   No murmur heard. Pulmonary/Chest: Effort normal. She has no decreased breath sounds. She has no wheezes. She has rhonchi. She has no rales.  Diffuse rhonchi right and left lungs. Cleared with cough.   Lymphadenopathy:       Head (right side): No submental, no submandibular and no tonsillar adenopathy present.       Head (left side): No submental, no submandibular and no tonsillar adenopathy present.    She has no cervical adenopathy.  Neurological: She is alert and oriented to person, place, and time.  Psychiatric: She has a normal mood and affect. Her speech is normal.      Assessment & Plan:   33 yof with no pertinent PMH presents with 5 day h/o sore throat, cough, and nasal congestion.  Cough - Plan: HYDROcodone-homatropine (HYCODAN) 5-1.5 MG/5ML syrup, Guaifenesin (MUCINEX MAXIMUM STRENGTH) 1200 MG TB12, benzonatate (TESSALON) 100 MG capsule --Hycodan at night as cough has kept her up --Tessalon daytime as she describes tickle in throat as cause of cough --Mucinex as has been  occasionally productive and rhonchi on exam --RTC 5 days if sx not resolving  Sore throat --Tylenol/rest/fluids  Rhinorrhea - Plan: ipratropium (ATROVENT) 0.03 % nasal spray --Atrovent for runny nose, instructions on use given  Julieta Gutting, PA-C Physician Assistant-Certified [facility]  12/01/2013 11:07 AM

## 2013-12-01 NOTE — Progress Notes (Signed)
I have discussed this case with Mr. Rosanne Sack, [place] and agree.

## 2013-12-15 ENCOUNTER — Ambulatory Visit (INDEPENDENT_AMBULATORY_CARE_PROVIDER_SITE_OTHER): Payer: BC Managed Care – PPO

## 2013-12-15 ENCOUNTER — Encounter: Payer: Self-pay | Admitting: Family Medicine

## 2013-12-15 ENCOUNTER — Ambulatory Visit (INDEPENDENT_AMBULATORY_CARE_PROVIDER_SITE_OTHER): Payer: BC Managed Care – PPO | Admitting: Family Medicine

## 2013-12-15 VITALS — BP 124/88 | HR 86 | Temp 98.3°F | Resp 16 | Ht 66.5 in | Wt 336.4 lb

## 2013-12-15 DIAGNOSIS — M542 Cervicalgia: Secondary | ICD-10-CM

## 2013-12-15 DIAGNOSIS — E559 Vitamin D deficiency, unspecified: Secondary | ICD-10-CM | POA: Insufficient documentation

## 2013-12-15 DIAGNOSIS — E7800 Pure hypercholesterolemia, unspecified: Secondary | ICD-10-CM | POA: Insufficient documentation

## 2013-12-15 LAB — LIPID PANEL
Cholesterol: 198 mg/dL (ref 0–200)
HDL: 52 mg/dL (ref >39)
LDL CALC: 124 mg/dL — AB (ref 0–99)
TRIGLYCERIDES: 108 mg/dL (ref <150)
Total CHOL/HDL Ratio: 3.8 Ratio
VLDL: 22 mg/dL (ref 0–40)

## 2013-12-15 LAB — COMPLETE METABOLIC PANEL WITH GFR
ALT: 18 U/L (ref 0–35)
AST: 20 U/L (ref 0–37)
Albumin: 3.7 g/dL (ref 3.5–5.2)
Alkaline Phosphatase: 53 U/L (ref 39–117)
BUN: 7 mg/dL (ref 6–23)
CALCIUM: 8.7 mg/dL (ref 8.4–10.5)
CO2: 23 mEq/L (ref 19–32)
Chloride: 104 mEq/L (ref 96–112)
Creat: 0.54 mg/dL (ref 0.50–1.10)
GFR, Est African American: >89 mL/min
GFR, Est Non African American: >89 mL/min
GLUCOSE: 75 mg/dL (ref 70–99)
Potassium: 4.3 mEq/L (ref 3.5–5.3)
Sodium: 136 mEq/L (ref 135–145)
TOTAL PROTEIN: 6.6 g/dL (ref 6.0–8.3)
Total Bilirubin: 0.9 mg/dL (ref 0.2–1.2)

## 2013-12-15 LAB — TSH: TSH: 1.267 u[IU]/mL (ref 0.350–4.500)

## 2013-12-15 MED ORDER — CYCLOBENZAPRINE HCL 5 MG PO TABS: 5.0000 mg | ORAL_TABLET | Freq: Three times a day (TID) | ORAL | Status: DC | PRN

## 2013-12-15 NOTE — Patient Instructions (Addendum)
Muscle Cramps and Spasms Muscle cramps and spasms occur when a muscle or muscles tighten and you have no control over this tightening (involuntary muscle contraction). They are a common problem and can develop in any muscle. The most common place is in the calf muscles of the leg. Both muscle cramps and muscle spasms are involuntary muscle contractions, but they also have differences:   Muscle cramps are sporadic and painful. They may last a few seconds to a quarter of an hour. Muscle cramps are often more forceful and last longer than muscle spasms.  Muscle spasms may or may not be painful. They may also last just a few seconds or much longer. CAUSES  It is uncommon for cramps or spasms to be due to a serious underlying problem. In many cases, the cause of cramps or spasms is unknown. Some common causes are:   Overexertion.   Overuse from repetitive motions (doing the same thing over and over).   Remaining in a certain position for a long period of time.   Improper preparation, form, or technique while performing a sport or activity.   Dehydration.   Injury.   Side effects of some medicines.   Abnormally low levels of the salts and ions in your blood (electrolytes), especially potassium and calcium. This could happen if you are taking water pills (diuretics) or you are pregnant.  Some underlying medical problems can make it more likely to develop cramps or spasms. These include, but are not limited to:   Diabetes.   Parkinson disease.   Hormone disorders, such as thyroid problems.   Alcohol abuse.   Diseases specific to muscles, joints, and bones.   Blood vessel disease where not enough blood is getting to the muscles.  HOME CARE INSTRUCTIONS   Stay well hydrated. Drink enough water and fluids to keep your urine clear or pale yellow.  It may be helpful to massage, stretch, and relax the affected muscle.  For tight or tense muscles, use a warm towel, heating  pad, or hot shower water directed to the affected area.  If you are sore or have pain after a cramp or spasm, applying ice to the affected area may relieve discomfort.  Put ice in a plastic bag.  Place a towel between your skin and the bag.  Leave the ice on for 15-20 minutes, 03-04 times a day.  Medicines used to treat a known cause of cramps or spasms may help reduce their frequency or severity. Only take over-the-counter or prescription medicines as directed by your caregiver. SEEK MEDICAL CARE IF:  Your cramps or spasms get more severe, more frequent, or do not improve over time.  MAKE SURE YOU:   Understand these instructions.  Will watch your condition.  Will get help right away if you are not doing well or get worse. Document Released: 06/29/2001 Document Revised: 05/04/2012 Document Reviewed: 12/25/2011 [facility] Patient Information 2015 [place], [place]. This information is not intended to replace advice given to you by your health care provider. Make sure you discuss any questions you have with your health care provider.   You can take Aleve 1 tablet twice a day for neck pain. I have prescribed a muscle relaxant that can be taken 2-3 times daily to help relieve muscle spasms. A massage may help relieve muscle tightness.  Your xrays look normal.  If the radiologist sees something on the xray that I have missed, I will contact you.  If the condition worsens, do not hesitate to  return for re-evaluation.

## 2013-12-15 NOTE — Progress Notes (Signed)
Subjective:    Patient ID: Kendra Nguyen, female    DOB: 1980-07-16, 33 y.o.   MRN: [id number]  HPI  This 33 y.o. AA female is to establish care; previous care w/  Dr. Heath Gold and staff at [facility]. Pt decided to establish care elsewhere since Dr. Karlton Lemon left that practice. She has elevated cholesterol, treated w/ simvastatin but unable to get med refill. No c/o myalgias, back pain or GI upset w/ statin. Needs Nguyen checked.  Pt s/p Lap-Band surgery for morbid obesity; this surgery was performed by surgeon in  [place], [place]. Pt initially lost 30 lbs but has gained it all back. She plans to return to surgeon for re-evaluation; she reports that the surgeons are seeing a high failure rate with this type of procedure.  Pt has hx of Vit D deficiency treated w/ prescription medication for 2-3 months. She states that previous medical practice did not contact her w/ a long-term treatment plan.  Today, pt c/o R shoulder pain and neck x 7 months, worse in last 2 months. Pt denies trauma or MVA or fall in remote past. She sleeps w/ a cervical roll. She does report that a new mattress is needed. She has some Ibuprofen 800 mg prescribed by Dr. Raphael Gibney; she took 1 tablet w/o relief. Pt denies numbness or weakness in arm.  Patient Active Problem List   Diagnosis Date Noted  . Pure hypercholesterolemia 12/15/2013  . Vitamin D deficiency 12/15/2013  . Morbid obesity 03/21/2011    Prior to Admission medications   Medication Sig Start Date End Date Taking? Authorizing Provider  benzonatate (TESSALON) 100 MG capsule Take 1-2 capsules (100-200 mg total) by mouth 3 (three) times daily as needed for cough. 12/01/13   Todd McVeigh, PA  Guaifenesin [facility] MAXIMUM STRENGTH) 1200 MG TB12 Take 1 tablet (1,200 mg total) by mouth every 12 (twelve) hours as needed. 12/01/13   Todd McVeigh, PA  HYDROcodone-homatropine (HYCODAN) 5-1.5 MG/5ML syrup Take 5 mLs by mouth every 8 (eight) hours as needed  for cough. 12/01/13   Todd McVeigh, PA  ipratropium (ATROVENT) 0.03 % nasal spray Place 2 sprays into both nostrils 2 (two) times daily. 12/01/13   Araceli Bouche, PA  simvastatin (ZOCOR) 20 MG tablet Take 20 mg by mouth daily.    Historical Provider, MD    History   Social History  . Marital Status: Single    Spouse Name: N/A    Number of Children: N/A  . Years of Education: N/A   Occupational History  . Early education.   Social History Main Topics  . Smoking status: Never Smoker   . Smokeless tobacco: Never Used  . Alcohol Use: No  . Drug Use: No  . Sexual Activity: Not on file   Other Topics Concern  . Not on file   Social History Narrative    Family History  Problem Relation Age of Onset  . Diabetes Mother   . Heart disease Mother   . Arthritis Father     gout  . COPD Father     smoker  . Hyperlipidemia Father   . Cancer Father   . Aneurysm Sister 34    cerebral; some memory loss  . COPD Paternal Aunt     Review of Systems  Constitutional: Negative.   HENT: Negative.   Eyes: Negative.   Respiratory: Negative.   Cardiovascular: Negative.   Endocrine: Negative.   Musculoskeletal: Positive for arthralgias and neck stiffness. Negative for myalgias,  joint swelling and neck pain.  Skin:       R lower leg- anterior shin w/ quarter-size nodule w/ bruise.Marland Kitchen  Neurological: Negative.   Psychiatric/Behavioral: Negative.        Objective:   Physical Exam  Constitutional: She is oriented to person, place, and time. Vital signs are normal. She appears well-developed and well-nourished. No distress.  HENT:  Head: Normocephalic and atraumatic.  Right Ear: Hearing, tympanic membrane and external ear normal.  Left Ear: Hearing, tympanic membrane, external ear and ear canal normal.  Nose: Nose normal. No nasal deformity or septal deviation.  Mouth/Throat: Uvula is midline, oropharynx is clear and moist and mucous membranes are normal. No oral lesions. No dental  caries.  Eyes: Conjunctivae, EOM and lids are normal. Pupils are equal, round, and reactive to light. No scleral icterus.  Neck: Trachea normal, normal range of motion, full passive range of motion without pain and phonation normal. Neck supple. No spinous process tenderness and no muscular tenderness present. No thyroid mass and no thyromegaly present.  Cardiovascular: Normal rate, regular rhythm, S1 normal, S2 normal and normal pulses.   No extrasystoles are present.  No murmur heard. Pulmonary/Chest: Effort normal and breath sounds normal. No respiratory distress.  Musculoskeletal: Normal range of motion. She exhibits no edema or tenderness.       Right shoulder: Normal.       Left shoulder: Normal.       Cervical back: She exhibits spasm. She exhibits normal range of motion, no swelling, no deformity and no pain.       Thoracic back: Normal.       Lumbar back: Normal.  Remainder of exam unremarkable.  Neurological: She is alert and oriented to person, place, and time. She has normal reflexes. No cranial nerve deficit. She exhibits normal muscle tone. Coordination normal.  Skin: Skin is warm and dry. She is not diaphoretic.  R anterior shin- quarter-size mildly tender nodule w/ faint discoloration.  Psychiatric: She has a normal mood and affect. Her behavior is normal. Judgment and thought content normal.  Nursing note and vitals reviewed.   [facility] reading (PRIMARY) by  Dr. Leward Quan: Cervical spine- Normal alignment and good disc spacing.      Assessment & Plan:  Pure hypercholesterolemia - Await lab results before resuming simvastatin 20 mg. Plan: Lipid panel, COMPLETE METABOLIC PANEL WITH GFR  Vitamin D deficiency - Plan: Vitamin D, 25-hydroxy  Morbid obesity - Encourage pt to schedule re-evaluation w/ surgeon in [place], [place] to discuss weight loss surgery. Plan: TSH, COMPLETE METABOLIC PANEL WITH GFR  Cervical spine pain -  Moist heat, Ibuprofen 800 mg 1 tab tid and  cyclobenzaprine 5 mg  Bid-tid for spasms. Consider massage. RTC if discomfort increases or symptoms worsen. Plan: DG Cervical Spine Complete   Meds ordered this encounter  Medications  . cyclobenzaprine (FLEXERIL) 5 MG tablet    Sig: Take 1 tablet (5 mg total) by mouth 3 (three) times daily as needed for muscle spasms.    Dispense:  30 tablet    Refill:  1

## 2013-12-16 LAB — VITAMIN D 25 HYDROXY (VIT D DEFICIENCY, FRACTURES): VIT D 25 HYDROXY: 16 ng/mL — AB (ref 30–100)

## 2013-12-18 ENCOUNTER — Other Ambulatory Visit: Payer: Self-pay | Admitting: Family Medicine

## 2013-12-18 DIAGNOSIS — R059 Cough, unspecified: Secondary | ICD-10-CM

## 2013-12-18 DIAGNOSIS — R05 Cough: Secondary | ICD-10-CM

## 2013-12-18 MED ORDER — SIMVASTATIN 20 MG PO TABS: 20.0000 mg | ORAL_TABLET | Freq: Every day | ORAL | Status: DC

## 2013-12-18 MED ORDER — ERGOCALCIFEROL 1.25 MG (50000 UT) PO CAPS: 50000.0000 [IU] | ORAL_CAPSULE | ORAL | Status: DC

## 2013-12-18 NOTE — Progress Notes (Signed)
Quick Note:  Please advise pt regarding following labs... Your recent lab results show a very low Vitamin D level. I am prescribing a supplement Vitamin D 50000 units to be taken once a week. The medication is at your pharmacy.  Try to get 10-15 minutes of sun exposure most days of the week, even during the winter. Vitamin D level needs to be rechecked in 3-4 months.  Other lab results are normal; blood sugar, salts in the blood, kidney and liver function test are normal. TSH (thyroid function test) is normal. Lipid panel shows LDL ("bad") cholesterol is a little above normal. I will refill the cholesterol medication (simvastatin 20 mg). You can correct this with improved nutrition and regular physical activity.  Copy to pt.   ______

## 2014-01-01 ENCOUNTER — Ambulatory Visit (INDEPENDENT_AMBULATORY_CARE_PROVIDER_SITE_OTHER): Payer: BC Managed Care – PPO | Admitting: Family Medicine

## 2014-01-01 VITALS — BP 138/92 | HR 87 | Temp 98.1°F | Resp 18 | Ht 67.0 in | Wt 343.0 lb

## 2014-01-01 DIAGNOSIS — L298 Other pruritus: Secondary | ICD-10-CM

## 2014-01-01 DIAGNOSIS — N898 Other specified noninflammatory disorders of vagina: Secondary | ICD-10-CM

## 2014-01-01 LAB — POCT WET PREP WITH KOH
CLUE CELLS WET PREP PER HPF POC: NEGATIVE
KOH PREP POC: NEGATIVE
RBC Wet Prep HPF POC: NEGATIVE
TRICHOMONAS UA: NEGATIVE
WBC WET PREP PER HPF POC: NEGATIVE
Yeast Wet Prep HPF POC: NEGATIVE

## 2014-01-01 MED ORDER — FLUCONAZOLE 150 MG PO TABS: 150.0000 mg | ORAL_TABLET | Freq: Every day | ORAL | Status: DC

## 2014-01-01 NOTE — Patient Instructions (Signed)
You likely have groin/ vaginal irration due to chafing.  Some element of yeast infection may also be present.   Use the fluconazole once a week as needed. Also try a barrier cream such as desitin or body glide.  Let me know if you do not feel better soon!

## 2014-01-01 NOTE — Progress Notes (Signed)
[facility] [address] [place] [address] [phone]  Date:  01/01/2014   Name:  Kendra Nguyen   DOB:  [birth year]   MRN:  [id number]  PCP:  Ellsworth Lennox, MD    Chief Complaint: Vaginal Itching   History of Present Illness:  Kendra Nguyen is a 33 y.o. very pleasant female patient who presents with the following:  She is here today with possible yeast infection.  She has noted sx for 2-3 days.  Seems worse after going to the bathroom, better in the shower.  She is obese and admits that her groin/ thighs tend to suffer from irritation and chafing.  Suspects this may be the root cause of her current sx.   She notes itching, no real discharge.   She had her period just about one week ago. She has had a few yeast infections in the past.       Patient Active Problem List   Diagnosis Date Noted  . Pure hypercholesterolemia 12/15/2013  . Vitamin D deficiency 12/15/2013  . Morbid obesity 03/21/2011    Past Medical History  Diagnosis Date  . Hypertension   . Hx of laparoscopic gastric banding     Past Surgical History  Procedure Laterality Date  . Ankle fracture surgery    . Laparoscopic gastric banding  04/17/2011    History  Substance Use Topics  . Smoking status: Never Smoker   . Smokeless tobacco: Never Used  . Alcohol Use: No    Family History  Problem Relation Age of Onset  . Diabetes Mother   . Heart disease Mother   . Arthritis Father     gout  . COPD Father     smoker  . Hyperlipidemia Father   . Cancer Father   . Aneurysm Sister 34    cerebral; some memory loss  . COPD Paternal Aunt     Allergies  Allergen Reactions  . Neomycin Rash    Medication list has been reviewed and updated.  Current Outpatient Prescriptions on File Prior to Visit  Medication Sig Dispense Refill  . benzonatate (TESSALON) 100 MG capsule Take 1-2 capsules (100-200 mg total) by mouth 3 (three) times daily as needed for cough. 40  capsule 0  . cyclobenzaprine (FLEXERIL) 5 MG tablet Take 1 tablet (5 mg total) by mouth 3 (three) times daily as needed for muscle spasms. 30 tablet 1  . ergocalciferol (DRISDOL) 50000 UNITS capsule Take 1 capsule (50,000 Units total) by mouth once a week. 4 capsule 5  . Guaifenesin (MUCINEX MAXIMUM STRENGTH) 1200 MG TB12 Take 1 tablet (1,200 mg total) by mouth every 12 (twelve) hours as needed. 14 tablet 1  . HYDROcodone-homatropine (HYCODAN) 5-1.5 MG/5ML syrup Take 5 mLs by mouth every 8 (eight) hours as needed for cough. 120 mL 0  . ipratropium (ATROVENT) 0.03 % nasal spray Place 2 sprays into both nostrils 2 (two) times daily. 30 mL 0  . simvastatin (ZOCOR) 20 MG tablet Take 1 tablet (20 mg total) by mouth daily. 30 tablet 5   No current facility-administered medications on file prior to visit.    Review of Systems:  As per HPI- otherwise negative.   Physical Examination: Filed Vitals:   01/01/14 1408  BP: 138/92  Pulse: 87  Temp: 98.1 F (36.7 C)  Resp: 18   Filed Vitals:   01/01/14 1408  Height: 5\' 7"  (1.702 m)  Weight: 343 lb (155.584 kg)   Body mass index is  53.71 kg/(m^2). Ideal Body Weight: Weight in (lb) to have BMI = 25: 159.3  GEN: WDWN, NAD, Non-toxic, A & O x 3 HEENT: Atraumatic, Normocephalic. Neck supple. No masses, No LAD. Ears and Nose: No external deformity. CV: RRR, No M/G/R. No JVD. No thrill. No extra heart sounds. PULM: CTA B, no wheezes, crackles, rhonchi. No retractions. No resp. distress. No accessory muscle use. ABD: S, NT, ND, +BS. No rebound. No HSM. EXTR: No c/c/e NEURO Normal gait.  PSYCH: Normally interactive. Conversant. Not depressed or anxious appearing.  Calm demeanor.  GU: her external vulva is irritated and sore appearing.  Did wet prep.  No lesions or discharge.    Results for orders placed or performed in visit on 01/01/14  POCT Wet Prep with KOH  Result Value Ref Range   Trichomonas, UA Negative    Clue Cells Wet Prep HPF POC  neg    Epithelial Wet Prep HPF POC 0-2    Yeast Wet Prep HPF POC neg    Bacteria Wet Prep HPF POC 1+    RBC Wet Prep HPF POC neg    WBC Wet Prep HPF POC neg    KOH Prep POC Negative     Assessment and Plan: Vaginal itching - Plan: POCT Wet Prep with KOH, fluconazole (DIFLUCAN) 150 MG tablet  Groin irritation and vaginal itching.  No definite evidence of monilia but is likely given her sx.  Will treat with diflucan and encouraged her to keep the area cool and dry See patient instructions for more details.   She will let me know if not better soon!   Signed Lamar Blinks, MD

## 2014-01-03 ENCOUNTER — Telehealth: Payer: Self-pay

## 2014-01-03 ENCOUNTER — Ambulatory Visit (INDEPENDENT_AMBULATORY_CARE_PROVIDER_SITE_OTHER): Payer: BC Managed Care – PPO | Admitting: Family Medicine

## 2014-01-03 VITALS — BP 126/82 | HR 74 | Temp 98.2°F | Resp 18 | Ht 66.5 in | Wt 339.0 lb

## 2014-01-03 DIAGNOSIS — N898 Other specified noninflammatory disorders of vagina: Secondary | ICD-10-CM

## 2014-01-03 MED ORDER — PREDNISONE 20 MG PO TABS: ORAL_TABLET | ORAL | Status: DC

## 2014-01-03 NOTE — Telephone Encounter (Signed)
Pt saw Dr. Lorelei Pont on Saturday, states that she has not had any relief of her symptoms, has also noticed additional areas of irritation. Please advise pt

## 2014-01-03 NOTE — Telephone Encounter (Signed)
Pt reports that the area between her labias she is having some bumps and irritation. She states that the bumps are painful and different type of pain. Pt advised to RTC. She agreed and will be in this afternoon.

## 2014-01-03 NOTE — Patient Instructions (Signed)
I think your problem is severe irritation that is causing itching and swelling of your vulva.  Try the prednisone as directed.  If you are not improved in 1-2 days please give me a call.  I will double check that your gonorrhea and chlamydia tests are negative

## 2014-01-03 NOTE — Progress Notes (Signed)
[facility] [address], [place] [address] [phone]  Date:  01/03/2014   Name:  Kendra Nguyen   DOB:  January 06, 1981   MRN:  [id number]  PCP:  Ellsworth Lennox, MD    Chief Complaint: Gynecologic Exam   History of Present Illness:  Kendra Nguyen is a 33 y.o. very pleasant female patient who presents with the following:  Here today to recheck vaginal/ groin pain. She was here 2 days ago with vaingal itching and discomfort.  We started her on oral diflucan for possible yeast vaginitis She notes that the area seems worse, but also "different."  Her itching is now more internal  The outer area seems better, but the "innter itchinness" and the clitoral hood seems worse.  She was sure if there might be some bumps there so she came in.   She is not sure if she may have noted some tender lymph nodes  No pain with urination, but she feels irritated after urination.   No fever, no vomiting or abd pain She had never STI evaluation about one mont ago.  Last new partner about 2 months ago  Patient Active Problem List   Diagnosis Date Noted  . Pure hypercholesterolemia 12/15/2013  . Vitamin D deficiency 12/15/2013  . Morbid obesity 03/21/2011    Past Medical History  Diagnosis Date  . Hypertension   . Hx of laparoscopic gastric banding     Past Surgical History  Procedure Laterality Date  . Ankle fracture surgery    . Laparoscopic gastric banding  04/17/2011    History  Substance Use Topics  . Smoking status: Never Smoker   . Smokeless tobacco: Never Used  . Alcohol Use: No    Family History  Problem Relation Age of Onset  . Diabetes Mother   . Heart disease Mother   . Arthritis Father     gout  . COPD Father     smoker  . Hyperlipidemia Father   . Cancer Father   . Aneurysm Sister 34    cerebral; some memory loss  . COPD Paternal Aunt     Allergies  Allergen Reactions  . Neomycin Rash    Medication list has been reviewed and  updated.  Current Outpatient Prescriptions on File Prior to Visit  Medication Sig Dispense Refill  . benzonatate (TESSALON) 100 MG capsule Take 1-2 capsules (100-200 mg total) by mouth 3 (three) times daily as needed for cough. (Patient not taking: Reported on 01/03/2014) 40 capsule 0  . cyclobenzaprine (FLEXERIL) 5 MG tablet Take 1 tablet (5 mg total) by mouth 3 (three) times daily as needed for muscle spasms. (Patient not taking: Reported on 01/03/2014) 30 tablet 1  . ergocalciferol (DRISDOL) 50000 UNITS capsule Take 1 capsule (50,000 Units total) by mouth once a week. (Patient not taking: Reported on 01/03/2014) 4 capsule 5  . fluconazole (DIFLUCAN) 150 MG tablet Take 1 tablet (150 mg total) by mouth daily. Use once a week as needed (Patient not taking: Reported on 01/03/2014) 4 tablet 0  . Guaifenesin (MUCINEX MAXIMUM STRENGTH) 1200 MG TB12 Take 1 tablet (1,200 mg total) by mouth every 12 (twelve) hours as needed. (Patient not taking: Reported on 01/03/2014) 14 tablet 1  . HYDROcodone-homatropine (HYCODAN) 5-1.5 MG/5ML syrup Take 5 mLs by mouth every 8 (eight) hours as needed for cough. (Patient not taking: Reported on 01/03/2014) 120 mL 0  . ipratropium (ATROVENT) 0.03 % nasal spray Place 2 sprays into both nostrils 2 (  two) times daily. (Patient not taking: Reported on 01/03/2014) 30 mL 0  . simvastatin (ZOCOR) 20 MG tablet Take 1 tablet (20 mg total) by mouth daily. (Patient not taking: Reported on 01/03/2014) 30 tablet 5   No current facility-administered medications on file prior to visit.    Review of Systems:  As per HPI- otherwise negative.   Physical Examination: Filed Vitals:   01/03/14 1359  BP: 126/82  Pulse: 74  Temp: 98.2 F (36.8 C)  Resp: 18   Filed Vitals:   01/03/14 1359  Height: 5' 6.5" (1.689 m)  Weight: 339 lb (153.769 kg)   Body mass index is 53.9 kg/(m^2). Ideal Body Weight: Weight in (lb) to have BMI = 25: 156.9  GEN: WDWN, NAD, Non-toxic, A & O x 3,  quite obese, OW looks well HEENT: Atraumatic, Normocephalic. Neck supple. No masses, No LAD. Ears and Nose: No external deformity. CV: RRR, No M/G/R. No JVD. No thrill. No extra heart sounds. PULM: CTA B, no wheezes, crackles, rhonchi. No retractions. No resp. distress. No accessory muscle use. ABD: S, NT, ND EXTR: No c/c/e NEURO Normal gait.  PSYCH: Normally interactive. Conversant. Not depressed or anxious appearing.  Calm demeanor.  GU: external vulvar irritation appears somewhat better and her inner thighs appear less irritated.  The labia are still slightly edematous and tender. No ulcers or vesicles to suggest HSV.   Vaginal canal is normal, non- tender.  No abnormal discharge. Mariea Clonts BME, no adnexal tenderness or masses  Assessment and Plan: Vaginal irritation - Plan: predniSONE (DELTASONE) 20 MG tablet, GC/Chlamydia Probe Amp persistent external vulvar irritation. Will add prednisone to try and control her swelling and itching.  She will let me know if not feeling better in the next couple of days Signed Lamar Blinks, MD

## 2014-01-04 ENCOUNTER — Telehealth: Payer: Self-pay | Admitting: Family Medicine

## 2014-01-04 ENCOUNTER — Telehealth: Payer: Self-pay

## 2014-01-04 ENCOUNTER — Encounter: Payer: Self-pay | Admitting: Family Medicine

## 2014-01-04 LAB — GC/CHLAMYDIA PROBE AMP
CT PROBE, AMP APTIMA: NEGATIVE
GC Probe RNA: NEGATIVE

## 2014-01-04 NOTE — Telephone Encounter (Signed)
DONE IN ERROR. I ROUTED TO THE WRONG DOCTOR. Kendra Nguyen

## 2014-01-04 NOTE — Telephone Encounter (Signed)
PATIENT STATES SHE CAME IN SAT. AND Monday TO SEE DR. COPLAND FOR VAGINAL IRRITATION. SHE PRESCRIBED HER PREDNISONE. SHE DID NOT START TO TAKE IT UNTIL LAST NIGHT. DR. Lorelei Pont TOLD HER TO CALL BACK IF SHE NOTICED ANY NEW SYMPTOMS. TODAY SHE NOTICED A SLIGHT BURNING SENSATION. SHE DOES NOT BURN WHEN SHE URINATES THOUGH. SHE JUST WANTED DR. COPLAND TO BE AWARE OF IT. BEST PHONE [phone] (CELL)  [facility] [place]   [place] [address]

## 2014-01-05 NOTE — Telephone Encounter (Signed)
Pt called back. She is feeling better today. Does not need anything.

## 2014-01-05 NOTE — Telephone Encounter (Signed)
Also let pt know her genprobe was all neg.

## 2014-01-05 NOTE — Telephone Encounter (Signed)
Called her back and LMOM. I got her message, let me know if she needs anything

## 2014-04-15 ENCOUNTER — Ambulatory Visit: Payer: BC Managed Care – PPO | Admitting: Family Medicine

## 2014-04-25 ENCOUNTER — Ambulatory Visit (INDEPENDENT_AMBULATORY_CARE_PROVIDER_SITE_OTHER): Payer: BC Managed Care – PPO | Admitting: Physician Assistant

## 2014-04-25 ENCOUNTER — Other Ambulatory Visit: Payer: Self-pay | Admitting: Physician Assistant

## 2014-04-25 VITALS — BP 130/86 | HR 75 | Temp 97.8°F | Resp 16 | Ht 66.5 in | Wt 342.2 lb

## 2014-04-25 DIAGNOSIS — E559 Vitamin D deficiency, unspecified: Secondary | ICD-10-CM | POA: Diagnosis not present

## 2014-04-25 DIAGNOSIS — R200 Anesthesia of skin: Secondary | ICD-10-CM

## 2014-04-25 DIAGNOSIS — R202 Paresthesia of skin: Secondary | ICD-10-CM | POA: Diagnosis not present

## 2014-04-25 LAB — POCT CBC
GRANULOCYTE PERCENT: 63 % (ref 37–80)
HCT, POC: 39.9 % (ref 37.7–47.9)
HEMOGLOBIN: 13.2 g/dL (ref 12.2–16.2)
Lymph, poc: 2.3 (ref 0.6–3.4)
MCH, POC: 28.6 pg (ref 27–31.2)
MCHC: 33.1 g/dL (ref 31.8–35.4)
MCV: 86.5 fL (ref 80–97)
MID (cbc): 0.4 (ref 0–0.9)
MPV: 8.1 fL (ref 0–99.8)
POC GRANULOCYTE: 4.6 (ref 2–6.9)
POC LYMPH %: 31.5 % (ref 10–50)
POC MID %: 5.5 %M (ref 0–12)
Platelet Count, POC: 301 10*3/uL (ref 142–424)
RBC: 4.61 M/uL (ref 4.04–5.48)
RDW, POC: 12.9 %
WBC: 7.3 10*3/uL (ref 4.6–10.2)

## 2014-04-25 LAB — COMPLETE METABOLIC PANEL WITH GFR
ALT: 15 U/L (ref 0–35)
AST: 19 U/L (ref 0–37)
Albumin: 4.1 g/dL (ref 3.5–5.2)
Alkaline Phosphatase: 63 U/L (ref 39–117)
BUN: 6 mg/dL (ref 6–23)
CHLORIDE: 105 meq/L (ref 96–112)
CO2: 25 mEq/L (ref 19–32)
Calcium: 9.1 mg/dL (ref 8.4–10.5)
Creat: 0.61 mg/dL (ref 0.50–1.10)
GFR, Est African American: >89 mL/min
GFR, Est Non African American: >89 mL/min
GLUCOSE: 89 mg/dL (ref 70–99)
POTASSIUM: 4.1 meq/L (ref 3.5–5.3)
Sodium: 140 mEq/L (ref 135–145)
TOTAL PROTEIN: 6.7 g/dL (ref 6.0–8.3)
Total Bilirubin: 0.9 mg/dL (ref 0.2–1.2)

## 2014-04-25 LAB — VITAMIN B12: Vitamin B-12: 676 pg/mL (ref 211–911)

## 2014-04-25 NOTE — Patient Instructions (Signed)
Please continue to take your medications.  I will contact you on your lab results within the next 10 days.

## 2014-04-25 NOTE — Progress Notes (Signed)
[facility] [address], [place] [address] [phone]  Date:  04/25/2014   Name:  Kendra Nguyen   DOB:  1980-11-13   MRN:  [id number]  PCP:  Ellsworth Lennox, MD    Chief Complaint: hand tinglining and Foot Pain   History of Present Illness:  Kendra Nguyen is a 34 y.o. very pleasant female with PMH of morbid obesity and lap band surgery (2013) patient who presents with the following:  Patient reports tingling in hands, feet, legs and face that has occurred for the last 2 months.  It began more at her  sharp pain in 5th toe that has occurred over the leg.   Pains in the left in the middle and behind the knee, and over lateral leg.  She started exercising over the last 2 weeks, then with more exercise over the last 3 days.  She states that it started hurting more on her left leg following a trampoline party.  She now has facial tingling on her left side of her face for 1-2 months, that lasts for seconds.  She reports no weakness or fatigue or weakness.  She has had some chest pains in past hx, with workup by cardiologist, and deduced, deduced to stress.  She denies any chest pains with these symptoms.  Some coughing secondary to sinus.  She reports no nausea, vomiting, or headache.  No trauma to back or extremities.  No sob or trouble breathing.  She reports no weakness.  There is no known rash.  Currently with yeast infection.  She is to see PCP for vit D.  Always has dry eyes.  She has been taking the statin for a year.  Patient adds that her left leg has always been larger than her right leg, and has been worked up in the past for possible clots at her discretion due to her mother passing from a PE.    End of November.  Patient Active Problem List   Diagnosis Date Noted  . Pure hypercholesterolemia 12/15/2013  . Vitamin D deficiency 12/15/2013  . Morbid obesity 03/21/2011    Past Medical History  Diagnosis Date  . Hypertension   . Hx of laparoscopic  gastric banding     Past Surgical History  Procedure Laterality Date  . Ankle fracture surgery    . Laparoscopic gastric banding  04/17/2011    History  Substance Use Topics  . Smoking status: Never Smoker   . Smokeless tobacco: Never Used  . Alcohol Use: No    Family History  Problem Relation Age of Onset  . Diabetes Mother   . Heart disease Mother   . Arthritis Father     gout  . COPD Father     smoker  . Hyperlipidemia Father   . Cancer Father   . Aneurysm Sister 34    cerebral; some memory loss  . COPD Paternal Aunt     Allergies  Allergen Reactions  . Neomycin Rash    Medication list has been reviewed and updated.  Current Outpatient Prescriptions on File Prior to Visit  Medication Sig Dispense Refill  . simvastatin (ZOCOR) 20 MG tablet Take 1 tablet (20 mg total) by mouth daily. 30 tablet 5   No current facility-administered medications on file prior to visit.    Review of Systems: ROS otherwise unremarkable unless listed above.    Physical Examination: Filed Vitals:   04/25/14 0851  BP: 130/86  Pulse: 75  Temp: 97.8  F (36.6 C)  Resp: 16   Filed Vitals:   04/25/14 0851  Height: 5' 6.5" (1.689 m)  Weight: 342 lb 3.2 oz (155.221 kg)   Body mass index is 54.41 kg/(m^2). Ideal Body Weight: Weight in (lb) to have BMI = 25: 156.9  Physical Exam  Constitutional: She is oriented to person, place, and time. She appears well-developed and well-nourished. No distress.  HENT:  Right Ear: Tympanic membrane, external ear and ear canal normal.  Left Ear: Tympanic membrane, external ear and ear canal normal.  Eyes: EOM are normal. Pupils are equal, round, and reactive to light. Right conjunctiva is not injected. Left conjunctiva is not injected. No scleral icterus. Right eye exhibits normal extraocular motion and no nystagmus. Left eye exhibits normal extraocular motion and no nystagmus.  Cardiovascular: Normal rate, regular rhythm and normal heart sounds.   Exam reveals no gallop, no distant heart sounds and no friction rub.   No murmur heard. Pulses:      Carotid pulses are 2+ on the right side, and 2+ on the left side.      Radial pulses are 2+ on the right side, and 2+ on the left side.       Dorsalis pedis pulses are 2+ on the right side, and 2+ on the left side.  No pitting edema.  Pulmonary/Chest: Effort normal and breath sounds normal. No respiratory distress. She has no wheezes.  Musculoskeletal: Normal range of motion. She exhibits no tenderness.  No tenderness of right and left lower extremity, including calves.  Spider veins along the left medial side of knee.  Normal ROM of upper and lower extremity.  Calf width left=60, calf width right=59.  Neurological: She is alert and oriented to person, place, and time. She displays no atrophy. No cranial nerve deficit. She exhibits normal muscle tone. Coordination and gait normal.  Normal rapid hand and foot movement.  Normal H to S.  Normal F to N.  Normal sensation throughout extremities.  Normal reflex throughout extremities.    Skin: Skin is warm and dry. No rash noted.  No jaundice.  Psychiatric: She has a normal mood and affect. Her behavior is normal.   Results for orders placed or performed in visit on 04/25/14  POCT CBC  Result Value Ref Range   WBC 7.3 4.6 - 10.2 K/uL   Lymph, poc 2.3 0.6 - 3.4   POC LYMPH PERCENT 31.5 10 - 50 %L   MID (cbc) 0.4 0 - 0.9   POC MID % 5.5 0 - 12 %M   POC Granulocyte 4.6 2 - 6.9   Granulocyte percent 63.0 37 - 80 %G   RBC 4.61 4.04 - 5.48 M/uL   Hemoglobin 13.2 12.2 - 16.2 g/dL   HCT, POC 39.9 37.7 - 47.9 %   MCV 86.5 80 - 97 fL   MCH, POC 28.6 27 - 31.2 pg   MCHC 33.1 31.8 - 35.4 g/dL   RDW, POC 12.9 %   Platelet Count, POC 301 142 - 424 K/uL   MPV 8.1 0 - 99.8 fL   EKG reviewed by Dr. Elder Cyphers: Normal  Assessment and Plan: 34 year old female is here today for numbness and tingling.  Possible B12 or electrolyte etiology, or liver  dysfunction.  Possible nerve impingement with exercise coupled with trampoline activites in this deconditioned female.  Low well's criteria risk.  Pending lab findings may attempt a muscle relaxer to see if this may be culprit.  Numbness and tingling - Plan: EKG 12-Lead, POCT CBC, COMPLETE METABOLIC PANEL WITH GFR, Vitamin B12  Vitamin D deficiency - Plan: Vitamin D 1,25 dihydroxy  Ivar Drape, PA-C [facility] 4/5/201610:04 AM

## 2014-04-28 ENCOUNTER — Telehealth: Payer: Self-pay | Admitting: Physician Assistant

## 2014-04-28 NOTE — Telephone Encounter (Signed)
Patient returned call about her lab findings. I read the following message to patient verbatim: "Please advise that her b12 and electrolytes are all within normal range and are not explaining the cause of your tingling. If your symptoms are still occurring, one thing we wanted to try is a muscle relaxant which may be causing nerve symptoms. I would put you on flexeril which you would use one at night for 1 week, and let me know if the symptoms continue......" Patient understood and is willing to try the Flexeril however she has some concerns still about the source of the tingling and wants to know if Flexeril will interfere with the Simvastatin she is currently taking. Please advise. [facility] [place]  [phone]

## 2014-04-29 ENCOUNTER — Other Ambulatory Visit: Payer: Self-pay | Admitting: Physician Assistant

## 2014-04-29 DIAGNOSIS — R202 Paresthesia of skin: Secondary | ICD-10-CM

## 2014-04-29 LAB — VITAMIN D 25 HYDROXY (VIT D DEFICIENCY, FRACTURES): Vit D, 25-Hydroxy: 18 ng/mL — ABNORMAL LOW (ref 30–100)

## 2014-04-29 LAB — CK: Total CK: 139 U/L (ref 7–177)

## 2014-05-03 ENCOUNTER — Encounter: Payer: Self-pay | Admitting: Family Medicine

## 2014-05-03 ENCOUNTER — Ambulatory Visit (INDEPENDENT_AMBULATORY_CARE_PROVIDER_SITE_OTHER): Payer: BC Managed Care – PPO | Admitting: Family Medicine

## 2014-05-03 VITALS — BP 131/87 | HR 80 | Temp 97.9°F | Resp 16 | Ht 66.0 in | Wt 341.0 lb

## 2014-05-03 DIAGNOSIS — Z8249 Family history of ischemic heart disease and other diseases of the circulatory system: Secondary | ICD-10-CM

## 2014-05-03 DIAGNOSIS — R209 Unspecified disturbances of skin sensation: Secondary | ICD-10-CM

## 2014-05-03 DIAGNOSIS — IMO0001 Reserved for inherently not codable concepts without codable children: Secondary | ICD-10-CM

## 2014-05-03 DIAGNOSIS — E7800 Pure hypercholesterolemia, unspecified: Secondary | ICD-10-CM

## 2014-05-03 LAB — LIPID PANEL
CHOLESTEROL: 153 mg/dL (ref 0–200)
HDL: 45 mg/dL — ABNORMAL LOW (ref >=46)
LDL Cholesterol: 91 mg/dL (ref 0–99)
Total CHOL/HDL Ratio: 3.4 Ratio
Triglycerides: 87 mg/dL (ref <150)
VLDL: 17 mg/dL (ref 0–40)

## 2014-05-03 MED ORDER — ERGOCALCIFEROL 1.25 MG (50000 UT) PO CAPS: 50000.0000 [IU] | ORAL_CAPSULE | ORAL | Status: DC

## 2014-05-03 NOTE — Patient Instructions (Addendum)
Given your family hx of sister with cerebral aneurysm (blood vessel rupture in the brain), I am referring you to a neurologist who can decide if you need further evaluation for hereditary type of aneurysm.

## 2014-05-04 NOTE — Progress Notes (Signed)
Quick Note:  Please notify pt that results are normal.   Provide pt with copy of labs. ______ 

## 2014-05-05 ENCOUNTER — Encounter: Payer: Self-pay | Admitting: Family Medicine

## 2014-05-05 NOTE — Progress Notes (Signed)
   Subjective:    Patient ID: Kendra Nguyen, female    DOB: 1980/04/08, 34 y.o.   MRN: [id number]  HPI  This 35 y.o. Female is here for follow-up to OV on 04/25/2014 for eval of numbness and tingling; encounter was w/ S. English, PA-C. She is here to review lab results. She c/o left facial paresthesias and similar discomfort in L arm and leg. Family hx significant for sister having a ruptured cerebral aneurysm w/ no CNS residua. Pt unable to give much detail about this medical problem.   Patient Active Problem List   Diagnosis Date Noted  . Pure hypercholesterolemia 12/15/2013  . Vitamin D deficiency 12/15/2013  . Morbid obesity 03/21/2011    Prior to Admission medications   Medication Sig Start Date End Date Taking? Authorizing Provider  simvastatin (ZOCOR) 20 MG tablet Take 1 tablet (20 mg total) by mouth daily. 12/18/13  Yes Barton Fanny, MD    Past Surgical History  Procedure Laterality Date  . Ankle fracture surgery    . Laparoscopic gastric banding  04/17/2011    SURG, SOC and FAM HX reviewed.   Review of Systems As per HPI; see note from 04/25/14 (no change).     Objective:   Physical Exam  Constitutional: She is oriented to person, place, and time. She appears well-developed and well-nourished. No distress.  HENT:  Head: Normocephalic and atraumatic.  Right Ear: External ear normal.  Left Ear: External ear normal.  Nose: Nose normal.  Mouth/Throat: Oropharynx is clear and moist.  Eyes: Conjunctivae and EOM are normal. No scleral icterus.  Neck: Normal range of motion. Neck supple.  Cardiovascular: Normal rate and regular rhythm.   Pulmonary/Chest: Effort normal. No respiratory distress.  Musculoskeletal: Normal range of motion.  Neurological: She is alert and oriented to person, place, and time. No cranial nerve deficit. She exhibits normal muscle tone. Coordination normal.  Skin: Skin is warm and dry. She is not diaphoretic.  Psychiatric: She has a normal  mood and affect. Her behavior is normal. Judgment and thought content normal.  Nursing note and vitals reviewed.   Vitamin D 25-hydroxy= 18 ng/mL  (4 months ago: 16 ng/mL).     Assessment & Plan:  Pure hypercholesterolemia - Plan: Lipid panel  Family history of cerebral aneurysm - Plan: Ambulatory referral to Neurology  Paresthesias/numbness - Plan: Ambulatory referral to Neurology  Meds ordered this encounter  Medications  . ergocalciferol (DRISDOL) 50000 UNITS capsule    Sig: Take 1 capsule (50,000 Units total) by mouth once a week.    Dispense:  4 capsule    Refill:  7

## 2014-06-06 ENCOUNTER — Ambulatory Visit (INDEPENDENT_AMBULATORY_CARE_PROVIDER_SITE_OTHER): Payer: BC Managed Care – PPO | Admitting: Neurology

## 2014-06-06 ENCOUNTER — Encounter: Payer: Self-pay | Admitting: Neurology

## 2014-06-06 VITALS — BP 139/88 | HR 91 | Ht 66.0 in | Wt 351.6 lb

## 2014-06-06 DIAGNOSIS — G35 Multiple sclerosis: Secondary | ICD-10-CM | POA: Diagnosis not present

## 2014-06-06 DIAGNOSIS — R531 Weakness: Secondary | ICD-10-CM

## 2014-06-06 DIAGNOSIS — M6289 Other specified disorders of muscle: Secondary | ICD-10-CM | POA: Diagnosis not present

## 2014-06-06 DIAGNOSIS — R202 Paresthesia of skin: Secondary | ICD-10-CM | POA: Diagnosis not present

## 2014-06-06 DIAGNOSIS — G609 Hereditary and idiopathic neuropathy, unspecified: Secondary | ICD-10-CM | POA: Diagnosis not present

## 2014-06-06 NOTE — Patient Instructions (Signed)
Overall you are doing fairly well but I do want to suggest a few things today:   Remember to drink plenty of fluid, eat healthy meals and do not skip any meals. Try to eat protein with a every meal and eat a healthy snack such as fruit or nuts in between meals. Try to keep a regular sleep-wake schedule and try to exercise daily, particularly in the form of walking, 20-30 minutes a day, if you can.   As far as your medications are concerned, I would like to suggest: none at this time  As far as diagnostic testing: labs, MRI brain, emg/ncs  I would like to see you back in 1-2 weeks for emg/ncs, sooner if we need to. Please call us with any interim questions, concerns, problems, updates or refill requests.   Please also call us for any test results so we can go over those with you on the phone.  My clinical assistant and will answer any of your questions and relay your messages to me and also relay most of my messages to you.   Our phone number is [phone]. We also have an after hours call service for urgent matters and there is a physician on-call for urgent questions. For any emergencies you know to call 911 or go to the nearest emergency room

## 2014-06-06 NOTE — Progress Notes (Signed)
[facility]    Provider:  Dr Jaynee Eagles Referring Provider: Barton Fanny, MD Primary Care Physician:  Ellsworth Lennox, MD  CC:  Numbness and tingling  HPI:  Kendra Nguyen is a 34 y.o. female here as a referral from Dr. Leward Quan for numbness and tingling. He is lovely 34 year old female with morbid obesity and high cholesterol, high blood pressure. She is status post lap band surgery in 2013 and will have gastric sleeve this year. She reports numbness and tingling in the fingers and toes. Rubbing hands feels better. Last for several hours. Started months ago. Symptoms are daily. She has noticed it more lately and feels her symptoms are worsening. Symptoms started on the left side and are currently more severe on that side, the patient has started having the symptoms on the right as well but not as bad left side is worse. Lately she is getting right-sided numbness. Just the tips of the toes. Worse at night in bed. Worse Also when sitting. No radicular symptoms from the low back. Nothing helps it, it'll just go away on it own. No weakness. No vision changes. No inciting factors. No family history of autoimmune disorders. 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  Reviewed notes, labs and imaging from outside physicians, which showed: LDL 124, B12 676, CK normal, low vitamin D 25-hydroxy  Personally reviewed x-rays of the cervical spine and agree with findings below:  Cervical Spine:  Cervical elements from the level of the C1-T1 maintain alignment, without subluxation, anterolisthesis, retrolisthesis.  No acute fracture line identified.  Unremarkable appearance of the craniocervical junction.  Vertebral body heights maintained as well as disc space heights.  No significant degenerative disc disease or endplate changes.  No significant facet disease.  Oblique images demonstrate no significant neural foraminal narrowing.  Prevertebral soft tissues within  normal limits.  Atlantodental distance is symmetric, with alignment of the lateral mass of C1 and C2.  IMPRESSION: No acute fracture or malalignment of the cervical spine.  No significant degenerative changes  Review of Systems: Patient complains of symptoms per HPI as well as the following symptoms: Numbness, tingling, no chest pain, no shortness of breath, no fever or systemic symptoms. Pertinent negatives per HPI. All others negative.   History   Social History  . Marital Status: Single    Spouse Name: N/A  . Number of Children: N/A  . Years of Education: Master's   Occupational History  . Teacher- middle school    Social History Main Topics  . Smoking status: Never Smoker   . Smokeless tobacco: Never Used  . Alcohol Use: No  . Drug Use: No  . Sexual Activity: Not on file   Other Topics Concern  . Not on file   Social History Narrative   Lives at home by herself.   Caffeine use: Soda occass [name])       Family History  Problem Relation Age of Onset  . Diabetes Mother   . Heart disease Mother   . Arthritis Father     gout  . COPD Father     smoker  . Hyperlipidemia Father   . Cancer Father   . Aneurysm Sister 34    cerebral; some memory loss  . COPD Paternal 51   . Neuropathy Neg Hx     Past Medical History  Diagnosis Date  . Hypertension   . Hx of laparoscopic gastric banding   . Varicose veins   . Hyperlipemia     Past  Surgical History  Procedure Laterality Date  . Ankle fracture surgery    . Laparoscopic gastric banding  04/17/2011    Current Outpatient Prescriptions  Medication Sig Dispense Refill  . ergocalciferol (DRISDOL) 50000 UNITS capsule Take 1 capsule (50,000 Units total) by mouth once a week. 4 capsule 7  . nystatin (MYCOSTATIN/NYSTOP) 100000 UNIT/GM POWD   1  . simvastatin (ZOCOR) 20 MG tablet Take 1 tablet (20 mg total) by mouth daily. 30 tablet 5  . cyclobenzaprine (FLEXERIL) 5 MG tablet Take 5 mg by mouth as needed.      . naproxen (NAPROSYN) 500 MG tablet Take 500 mg by mouth as needed.     No current facility-administered medications for this visit.    Allergies as of 06/06/2014 - Review Complete 06/06/2014  Allergen Reaction Noted  . Neomycin Rash 03/21/2011    Vitals: BP 139/88 mmHg  Pulse 91  Ht 5\' 6"  (1.676 m)  Wt 351 lb 9.6 oz (159.485 kg)  BMI 56.78 kg/m2 Last Weight:  Wt Readings from Last 1 Encounters:  06/06/14 351 lb 9.6 oz (159.485 kg)   Last Height:   Ht Readings from Last 1 Encounters:  06/06/14 5\' 6"  (1.676 m)   Physical exam: Exam: Gen: NAD, conversant, well nourised, obese, well groomed                     CV: RRR, no MRG. No Carotid Bruits. No peripheral edema, warm, nontender Eyes: Conjunctivae clear without exudates or hemorrhage  Neuro: Detailed Neurologic Exam  Speech:    Speech is normal; fluent and spontaneous with normal comprehension.  Cognition:    The patient is oriented to person, place, and time;     recent and remote memory intact;     language fluent;     normal attention, concentration,     fund of knowledge Cranial Nerves:    The pupils are equal, round, and reactive to light. The fundi are normal and spontaneous venous pulsations are present. Visual fields are full to finger confrontation. Extraocular movements are intact. Trigeminal sensation is intact and the muscles of mastication are normal. The face is symmetric. The palate elevates in the midline. Hearing intact. Voice is normal. Shoulder shrug is normal. The tongue has normal motion without fasciculations.   Coordination:    Normal finger to nose and heel to shin. Normal rapid alternating movements.   Gait:    Heel-toe and tandem gait are normal.   Motor Observation:    No asymmetry, no atrophy, and no involuntary movements noted. Tone:    Normal muscle tone.    Posture:    Posture is normal. normal erect    Strength:    Strength is V/V in the upper and lower limbs.       Sensation: intact to LT     Reflex Exam:  DTR's:    Deep tendon reflexes in the upper and lower extremities are normal bilaterally.   Toes:    The toes are downgoing bilaterally.   Clonus:    Clonus is absent.       Assessment/Plan:  This is a lovely 34 year old female with morbid obesity, hyperlipidemia, LAP-BAND surgery who is here for worsening paresthesias that are more severe on the left side.   complete serum neuropathy panel.  In this young patient with paresthesias, especially since they're asymmetric, feel that MRI of the brain is indicated to rule out intracranial etiologies such as MS EMG nerve conduction study of the  upper extremities and 1 lower extremity to evaluate for other etiologies of numbness and tingling including median or ulnar neuropathy and evaluate for peripheral polyneuropathy.  Sarina Ill, MD  [facility] [address] [place], [place] [address]  Phone [phone] Fax [phone]

## 2014-06-07 ENCOUNTER — Encounter: Payer: Self-pay | Admitting: Neurology

## 2014-06-07 DIAGNOSIS — G609 Hereditary and idiopathic neuropathy, unspecified: Secondary | ICD-10-CM | POA: Insufficient documentation

## 2014-06-08 ENCOUNTER — Telehealth: Payer: Self-pay | Admitting: Neurology

## 2014-06-08 NOTE — Telephone Encounter (Signed)
Patient called and requested to speak with the nurse regarding some symptoms she has been experiencing. Please call and advise.

## 2014-06-08 NOTE — Telephone Encounter (Signed)
Pt experiencing symptoms. She stated she is "starting to feel tingling on side of face on left of face, and I have felt it before" It has gone away and comes and go. She also is having weakness in right hand and it is hard for her to open jars, which is not something new. She has had this. She said she has not had any numbness or tingling today. I told her I would let Dr. Jaynee Eagles know and we would give her a call back. Pt verbalized understanding.

## 2014-06-08 NOTE — Telephone Encounter (Signed)
Spoke to patient, these are chronic symptoms. Asked her to check her blood pressure when she has symptoms.

## 2014-06-09 LAB — PAN-ANCA
ANCA Proteinase 3: <3.5 U/mL (ref 0.0–3.5)
Atypical pANCA: <1:20 {titer}
C-ANCA: <1:20 {titer}
Myeloperoxidase Ab: <9 U/mL (ref 0.0–9.0)
P-ANCA: <1:20 {titer}

## 2014-06-09 LAB — BASIC METABOLIC PANEL
BUN / CREAT RATIO: 15 (ref 8–20)
BUN: 9 mg/dL (ref 6–20)
CALCIUM: 8.9 mg/dL (ref 8.7–10.2)
CO2: 22 mmol/L (ref 18–29)
CREATININE: 0.6 mg/dL (ref 0.57–1.00)
Chloride: 101 mmol/L (ref 97–108)
GFR calc Af Amer: 139 mL/min/{1.73_m2} (ref >59)
GFR, EST NON AFRICAN AMERICAN: 120 mL/min/{1.73_m2} (ref >59)
Glucose: 82 mg/dL (ref 65–99)
Potassium: 3.9 mmol/L (ref 3.5–5.2)
SODIUM: 139 mmol/L (ref 134–144)

## 2014-06-09 LAB — HEMOGLOBIN A1C
Est. average glucose Bld gHb Est-mCnc: 114 mg/dL
Hgb A1c MFr Bld: 5.6 % (ref 4.8–5.6)

## 2014-06-09 LAB — ANA W/REFLEX: ANA: NEGATIVE

## 2014-06-09 LAB — RPR: RPR: NONREACTIVE

## 2014-06-09 LAB — B. BURGDORFI ANTIBODIES: Lyme IgG/IgM Ab: <0.91 {ISR} (ref 0.00–0.90)

## 2014-06-09 LAB — HIGH SENSITIVITY CRP: CRP, High Sensitivity: 4.71 mg/L — ABNORMAL HIGH (ref 0.00–3.00)

## 2014-06-09 LAB — RHEUMATOID FACTOR: RHEUMATOID FACTOR: 7.9 [IU]/mL (ref 0.0–13.9)

## 2014-06-09 LAB — TSH: TSH: 2.06 u[IU]/mL (ref 0.450–4.500)

## 2014-06-09 LAB — SEDIMENTATION RATE: SED RATE: 9 mm/h (ref 0–32)

## 2014-06-09 LAB — HEPATITIS C ANTIBODY: Hep C Virus Ab: <0.1 s/co ratio (ref 0.0–0.9)

## 2014-06-09 LAB — ANGIOTENSIN CONVERTING ENZYME: Angio Convert Enzyme: 60 U/L (ref 14–82)

## 2014-06-10 ENCOUNTER — Telehealth: Payer: Self-pay

## 2014-06-10 NOTE — Telephone Encounter (Signed)
Pt called returning Joy's call for lab result. I relayed information to pt labs were normal.

## 2014-06-10 NOTE — Telephone Encounter (Signed)
VM left to inform patient of normal  lab results, patient asked to call office back to receive results.

## 2014-06-16 ENCOUNTER — Ambulatory Visit (INDEPENDENT_AMBULATORY_CARE_PROVIDER_SITE_OTHER): Payer: BC Managed Care – PPO | Admitting: Family Medicine

## 2014-06-16 VITALS — BP 115/80 | HR 85 | Temp 98.7°F | Resp 17 | Ht 66.0 in | Wt 356.8 lb

## 2014-06-16 DIAGNOSIS — R1032 Left lower quadrant pain: Secondary | ICD-10-CM | POA: Diagnosis not present

## 2014-06-16 DIAGNOSIS — Z7189 Other specified counseling: Secondary | ICD-10-CM | POA: Diagnosis not present

## 2014-06-16 DIAGNOSIS — N898 Other specified noninflammatory disorders of vagina: Secondary | ICD-10-CM | POA: Diagnosis not present

## 2014-06-16 DIAGNOSIS — Z7184 Encounter for health counseling related to travel: Secondary | ICD-10-CM

## 2014-06-16 DIAGNOSIS — K921 Melena: Secondary | ICD-10-CM | POA: Diagnosis not present

## 2014-06-16 LAB — POCT UA - MICROSCOPIC ONLY
CASTS, UR, LPF, POC: NEGATIVE
Crystals, Ur, HPF, POC: NEGATIVE
Yeast, UA: NEGATIVE

## 2014-06-16 LAB — POCT URINALYSIS DIPSTICK
Bilirubin, UA: NEGATIVE
Glucose, UA: NEGATIVE
Ketones, UA: NEGATIVE
NITRITE UA: NEGATIVE
Protein, UA: NEGATIVE
Spec Grav, UA: 1.01
Urobilinogen, UA: 0.2
pH, UA: 6

## 2014-06-16 LAB — POCT WET PREP WITH KOH
Clue Cells Wet Prep HPF POC: NEGATIVE
KOH PREP POC: NEGATIVE
TRICHOMONAS UA: NEGATIVE
Yeast Wet Prep HPF POC: NEGATIVE

## 2014-06-16 LAB — POCT URINE PREGNANCY: PREG TEST UR: NEGATIVE

## 2014-06-16 MED ORDER — ATOVAQUONE-PROGUANIL HCL 250-100 MG PO TABS: 1.0000 | ORAL_TABLET | Freq: Every day | ORAL | Status: DC

## 2014-06-16 MED ORDER — TERCONAZOLE 0.4 % VA CREA: 1.0000 | TOPICAL_CREAM | Freq: Every day | VAGINAL | Status: DC

## 2014-06-16 MED ORDER — DOXYCYCLINE HYCLATE 100 MG PO CAPS: 100.0000 mg | ORAL_CAPSULE | Freq: Every day | ORAL | Status: DC

## 2014-06-16 MED ORDER — FLUCONAZOLE 150 MG PO TABS: 150.0000 mg | ORAL_TABLET | Freq: Once | ORAL | Status: DC

## 2014-06-16 NOTE — Patient Instructions (Signed)

## 2014-06-16 NOTE — Progress Notes (Signed)
Subjective:  This chart was scribed for Wardell Honour, MD by Starleen Arms, ED Scribe. This patient was seen in room 4 and the patient's care was started at 5:22 PM.   Patient ID: Kendra Nguyen, female    DOB: 01-31-1980, 34 y.o.   MRN: [id number]  HPI HPI Comments: LEORA PLATT is a 34 y.o. female who presents to [facility] complaining of odorous, yellow vaginal discharge onset 2 days ago ith associated vaginal itching and lower abdominal pain onset 2 days ago.  She reports since February she has had similar episodes after every menstrual period.  She was seen previously for one of these episodes and prescribed a nystatin powder and a topical cream.  After misunderstanding the dosage instructions, she did not use a a full course but did observe relief with these treatments. Patient is not currently sexually active and does not have a history of STD.  Her last sexual encounter was 10/2013.   She has a history of obesity and lap band procedure.  She initially lost weight after the procedure but has now surpassed her weight prior to the procedure.  She also has a history of HLD for which she currently takes simvastatin.  She previously had medication-controlled HTN that resolved after her lap band procedure and she no longer takes any medication for this  Patient also reports she is going to [place] for two weeks this summer and requests antimalarial medication.  She is a frequent international traveller and has taken an antimalarial previously but cannot recall the name.  She is UTD on tetanus, hepatitis A,B, and meningitis vaccine.     OB/GYN: Dr. Raphael Gibney  Review of Systems  Gastrointestinal: Positive for abdominal pain.  Genitourinary: Positive for vaginal discharge.       Objective:   Physical Exam  Constitutional: She is oriented to person, place, and time. She appears well-developed and well-nourished. No distress.  HENT:  Head: Normocephalic and atraumatic.  Eyes: Conjunctivae  and EOM are normal.  Neck: Neck supple. No tracheal deviation present.  Cardiovascular: Normal rate and normal heart sounds.   Pulmonary/Chest: Effort normal and breath sounds normal. No respiratory distress.  Abdominal: Soft. She exhibits no distension. There is no tenderness.  Genitourinary:  Vaginal exam: Scant vaginal discharge in vaginal vault with odor.  No cervical motion tenderness   Rectal exam: no masses or hemorrhoids appreciated.  No anal fissures  Musculoskeletal: Normal range of motion.  Neurological: She is alert and oriented to person, place, and time.  Skin: Skin is warm and dry.  Psychiatric: She has a normal mood and affect. Her behavior is normal.  Nursing note and vitals reviewed.  Results for orders placed or performed in visit on 06/16/14  POCT urinalysis dipstick  Result Value Ref Range   Color, UA yellow    Clarity, UA cloudy    Glucose, UA neg    Bilirubin, UA neg    Ketones, UA neg    Spec Grav, UA 1.010    Blood, UA trace    pH, UA 6.0    Protein, UA neg    Urobilinogen, UA 0.2    Nitrite, UA neg    Leukocytes, UA large (3+)   POCT UA - Microscopic Only  Result Value Ref Range   WBC, Ur, HPF, POC 8-12    RBC, urine, microscopic 0-2    Bacteria, U Microscopic 1+    Mucus, UA trace    Epithelial cells, urine per micros 2-4  Crystals, Ur, HPF, POC neg    Casts, Ur, LPF, POC neg    Yeast, UA neg   POCT Wet Prep with KOH  Result Value Ref Range   Trichomonas, UA Negative    Clue Cells Wet Prep HPF POC neg    Epithelial Wet Prep HPF POC 2-4    Yeast Wet Prep HPF POC neg    Bacteria Wet Prep HPF POC small    RBC Wet Prep HPF POC 0-2    WBC Wet Prep HPF POC 2-4    KOH Prep POC Negative   POCT urine pregnancy  Result Value Ref Range   Preg Test, Ur Negative        Assessment & Plan:  5:36 PM  1. Vaginal discharge   2. Abdominal pain, LLQ   3. Bloody stool   4. Counseling about travel    Meds ordered this encounter  Medications  .  doxycycline (VIBRAMYCIN) 100 MG capsule    Sig: Take 1 capsule (100 mg total) by mouth daily. Start 2 days before departure; continue for one month after return to the [place].    Dispense:  50 capsule    Refill:  0  . atovaquone-proguanil (MALARONE) 250-100 MG TABS    Sig: Take 1 tablet by mouth daily. Start two days before departure; continue for seven days after return    Dispense:  24 tablet    Refill:  0    I personally performed the services described in this documentation, which was scribed in my presence. The recorded information has been reviewed and considered.  Lener Ventresca Elayne Guerin, M.D. [facility] [address] [place], [place]  [address] [phone] phone [phone] fax

## 2014-06-17 LAB — GC/CHLAMYDIA PROBE AMP
CT Probe RNA: NEGATIVE
GC PROBE AMP APTIMA: NEGATIVE

## 2014-06-19 LAB — URINE CULTURE

## 2014-06-20 ENCOUNTER — Ambulatory Visit (INDEPENDENT_AMBULATORY_CARE_PROVIDER_SITE_OTHER): Payer: BC Managed Care – PPO | Admitting: Physician Assistant

## 2014-06-20 VITALS — BP 122/80 | HR 115 | Temp 98.7°F | Resp 17 | Ht 66.0 in | Wt 344.0 lb

## 2014-06-20 DIAGNOSIS — R112 Nausea with vomiting, unspecified: Secondary | ICD-10-CM | POA: Diagnosis not present

## 2014-06-20 DIAGNOSIS — R109 Unspecified abdominal pain: Secondary | ICD-10-CM

## 2014-06-20 LAB — POCT URINALYSIS DIPSTICK
BILIRUBIN UA: NEGATIVE
Blood, UA: NEGATIVE
GLUCOSE UA: NEGATIVE
Ketones, UA: NEGATIVE
Leukocytes, UA: NEGATIVE
Nitrite, UA: NEGATIVE
PH UA: 5.5
Spec Grav, UA: 1.025
Urobilinogen, UA: 0.2

## 2014-06-20 LAB — POCT CBC
Granulocyte percent: 84.4 %G — AB (ref 37–80)
HCT, POC: 40 % (ref 37.7–47.9)
Hemoglobin: 13.2 g/dL (ref 12.2–16.2)
LYMPH, POC: 1 (ref 0.6–3.4)
MCH: 28.1 pg (ref 27–31.2)
MCHC: 33.1 g/dL (ref 31.8–35.4)
MCV: 84.9 fL (ref 80–97)
MID (CBC): 0.6 (ref 0–0.9)
MPV: 7.8 fL (ref 0–99.8)
PLATELET COUNT, POC: 318 10*3/uL (ref 142–424)
POC Granulocyte: 8.5 — AB (ref 2–6.9)
POC LYMPH PERCENT: 10.1 %L (ref 10–50)
POC MID %: 5.5 %M (ref 0–12)
RBC: 4.71 M/uL (ref 4.04–5.48)
RDW, POC: 12.8 %
WBC: 10.1 10*3/uL (ref 4.6–10.2)

## 2014-06-20 LAB — POCT UA - MICROSCOPIC ONLY
CRYSTALS, UR, HPF, POC: NEGATIVE
Casts, Ur, LPF, POC: NEGATIVE
Mucus, UA: POSITIVE
YEAST UA: NEGATIVE

## 2014-06-20 LAB — POCT URINE PREGNANCY: PREG TEST UR: NEGATIVE

## 2014-06-20 MED ORDER — ONDANSETRON 4 MG PO TBDP
4.0000 mg | ORAL_TABLET | Freq: Once | ORAL | Status: AC
  Administered 2014-06-20: 4 mg via ORAL

## 2014-06-20 MED ORDER — ONDANSETRON 8 MG PO TBDP: 8.0000 mg | ORAL_TABLET | Freq: Three times a day (TID) | ORAL | Status: DC | PRN

## 2014-06-20 MED ORDER — HYOSCYAMINE SULFATE 0.125 MG/5ML PO ELIX: 0.1250 mg | ORAL_SOLUTION | Freq: Four times a day (QID) | ORAL | Status: DC | PRN

## 2014-06-20 NOTE — Progress Notes (Signed)
[facility] [address] [place]  [address] [phone]  Date:  06/20/2014   Name:  Kendra Nguyen   DOB:  [birth year]   MRN:  [id number]  PCP:  Ellsworth Lennox, MD    History of Present Illness:  Kendra Nguyen is a 34 y.o. female patient who presents to [facility] for chief complaint of nausea and vomiting, diarrhea, headache, and recent onset of chest pain. The nausea and vomiting started yesterday afternoon and has lasted until 1 AM this morning. This is a non-bloody and non-bilious emesis. The diarrhea followed, a watery form. There is abdominal cramping as well. She has not had any fever, but states that she has been having chills. The stool today continues to be watery, but is starting to have some form. There is no blood or melena present. Patient stated that the nausea and vomiting started at church. She recalls the night before having chicken and waffles followed by 2 alcoholic beverages( mixed drinks). She has not been able to eat anything today. She has no dizziness, but states that she does still weak. She continues to have the nausea. She has tried heat on ice. She has attempted Pepto-Bismol, but this was followed by emesis. She states that she had a headache, but this has resolved.  Patient was here 2 weeks ago for abnormal vaginal discharge. She has not started the Diflucan at this time. She is also going out of the country and was given anti-malarial medication/preventative drugs. She has not been taking this either.  She denies any change in vaginal discharge or having odor.  LMP was about 10 days ago.  She denies hematuria, frequency, or dysuria.     Patient Active Problem List   Diagnosis Date Noted  . 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 Pure hypercholesterolemia 12/15/2013  . Vitamin D deficiency 12/15/2013  . Morbid obesity 03/21/2011    Past Medical History  Diagnosis Date  . Hypertension   . Hx of laparoscopic  gastric banding   . Varicose veins   . Hyperlipemia     Past Surgical History  Procedure Laterality Date  . Ankle fracture surgery    . Laparoscopic gastric banding  04/17/2011    History  Substance Use Topics  . Smoking status: Never Smoker   . Smokeless tobacco: Never Used  . Alcohol Use: No    Family History  Problem Relation Age of Onset  . Diabetes Mother   . Heart disease Mother   . Arthritis Father     gout  . COPD Father     smoker  . Hyperlipidemia Father   . Cancer Father   . Aneurysm Sister 34    cerebral; some memory loss  . COPD Paternal 45   . Neuropathy Neg Hx     Allergies  Allergen Reactions  . Neomycin Rash    Medication list has been reviewed and updated.  Current Outpatient Prescriptions on File Prior to Visit  Medication Sig Dispense Refill  . ergocalciferol (DRISDOL) 50000 UNITS capsule Take 1 capsule (50,000 Units total) by mouth once a week. 4 capsule 7  . fluconazole (DIFLUCAN) 150 MG tablet Take 1 tablet (150 mg total) by mouth once. Repeat if needed 2 tablet 0  . simvastatin (ZOCOR) 20 MG tablet Take 1 tablet (20 mg total) by mouth daily. 30 tablet 5  . atovaquone-proguanil (MALARONE) 250-100 MG TABS Take 1 tablet by mouth daily. Start two days before departure; continue for seven  days after return (Patient not taking: Reported on 06/20/2014) 24 tablet 0  . cyclobenzaprine (FLEXERIL) 5 MG tablet Take 5 mg by mouth as needed.    . doxycycline (VIBRAMYCIN) 100 MG capsule Take 1 capsule (100 mg total) by mouth daily. Start 2 days before departure; continue for one month after return to the [place]. (Patient not taking: Reported on 06/20/2014) 50 capsule 0  . naproxen (NAPROSYN) 500 MG tablet Take 500 mg by mouth as needed.    . nystatin (MYCOSTATIN/NYSTOP) 100000 UNIT/GM POWD   1   No current facility-administered medications on file prior to visit.    ROS ROS otherwise unremarkable unless listed above.  Physical Examination: BP 122/80 mmHg   Pulse 115  Temp(Src) 98.7 F (37.1 C) (Oral)  Resp 17  Ht 5\' 6"  (1.676 m)  Wt 344 lb (156.037 kg)  BMI 55.55 kg/m2  SpO2 98%  LMP 06/11/2014 Ideal Body Weight: Weight in (lb) to have BMI = 25: 154.6  Physical Exam Alert cooperative and oriented x4. PERRL within normal conjunctiva. Lung sounds are normal without wheezing rhonchi or decreased breath sounds. Tachycardic without murmur or gallop or rubs appreciated. Normal bowel sounds without mass.  Periumbilical quadrant pressure that extends downward.  Negative Murphy and McBurney's sign.  No CVA tenderness.  She has normal distal pulses at DP.  Normal ocular movement and gait.    Results for orders placed or performed in visit on 06/20/14  POCT CBC  Result Value Ref Range   WBC 10.1 4.6 - 10.2 K/uL   Lymph, poc 1.0 0.6 - 3.4   POC LYMPH PERCENT 10.1 10 - 50 %L   MID (cbc) 0.6 0 - 0.9   POC MID % 5.5 0 - 12 %M   POC Granulocyte 8.5 (A) 2 - 6.9   Granulocyte percent 84.4 (A) 37 - 80 %G   RBC 4.71 4.04 - 5.48 M/uL   Hemoglobin 13.2 12.2 - 16.2 g/dL   HCT, POC 40.0 37.7 - 47.9 %   MCV 84.9 80 - 97 fL   MCH, POC 28.1 27 - 31.2 pg   MCHC 33.1 31.8 - 35.4 g/dL   RDW, POC 12.8 %   Platelet Count, POC 318 142 - 424 K/uL   MPV 7.8 0 - 99.8 fL  POCT UA - Microscopic Only  Result Value Ref Range   WBC, Ur, HPF, POC 2-4    RBC, urine, microscopic 0-1    Bacteria, U Microscopic trace    Mucus, UA pos    Epithelial cells, urine per micros 3-7    Crystals, Ur, HPF, POC neg    Casts, Ur, LPF, POC neg    Yeast, UA neg   POCT urinalysis dipstick  Result Value Ref Range   Color, UA yellow    Clarity, UA clear    Glucose, UA neg    Bilirubin, UA neg    Ketones, UA neg    Spec Grav, UA 1.025    Blood, UA neg    pH, UA 5.5    Protein, UA trace    Urobilinogen, UA 0.2    Nitrite, UA neg    Leukocytes, UA Negative   POCT urine pregnancy  Result Value Ref Range   Preg Test, Ur Negative       Assessment and Plan: 34 year old  female with PMH listed above is here today for n/v and diarrhea that appears to be slowly resolving.  Likely a gastroenteritis of viral etiology.  Discussed  patient with Dr. Lorelei Pont.  1L IV fluids given in house.  Patient with normal HR following fluid, and reports feeling less weak.  Given zofran, and advised to avoid anti-motility drugs at this time.  Levsin given.  Advised to return if she is not able to tolerate zofran and fluids, fever, dizziness.  Will stool culture at that time.    Nausea and vomiting, vomiting of unspecified type - Plan: POCT UA - Microscopic Only, POCT urinalysis dipstick, ondansetron (ZOFRAN-ODT) disintegrating tablet 4 mg, ondansetron (ZOFRAN-ODT) 8 MG disintegrating tablet, CANCELED: POCT glucose (manual entry)  Abdominal pain, unspecified abdominal location - Plan: POCT CBC, POCT urine pregnancy  Abdominal cramping - Plan: hyoscyamine (LEVSIN) 0.125 MG/5ML Karen Kays   Ivar Drape, PA-C [facility] 5/31/20169:12 AM

## 2014-06-20 NOTE — Patient Instructions (Signed)

## 2014-06-21 ENCOUNTER — Telehealth: Payer: Self-pay | Admitting: Emergency Medicine

## 2014-06-21 ENCOUNTER — Encounter: Payer: Self-pay | Admitting: Physician Assistant

## 2014-06-21 ENCOUNTER — Telehealth: Payer: Self-pay | Admitting: Physician Assistant

## 2014-06-21 NOTE — Telephone Encounter (Signed)
Patient states that she is not urinating enough. States that she increased her water intake and this has helped with urination slightly. She also states that she has developed chills and a fever. States that she will come in this evening to see Colletta [place] but would still like a return phone call.  [phone]

## 2014-06-21 NOTE — Telephone Encounter (Signed)
Contacted patient AM 9:27 am  She states that she is doing ok. Patient reports that she is urinating normally now.  She was nervous about drinking fluid and have emesis  and was not taking in fluids at all yesterday.  At night time, she began to increase her water intake, but was concerned that she had very little urine--"trickling".  This prompted phone call to on-cal. By this time, she has had increased urination.  She has taken 2 Zofran tablets since yesterday afternoon, and reports no nausea.  She has eaten soup and tolerated well.  She has had 3-4 BM's today, that are a dark greenish black watery diarrhea.  She did have peptobismol yesterday.  She reports chills last night, but this has resolved.  She has had no abdominal cramping.  She did not get the Levsin yesterday. She has no weakness or dizziness.  She has a headache rated 6/10.   I have advised her to continue to attempt taking more bland solids. Treat headache with ibuprofen or tylenol.  If this does not improve she can return today.  She is urinating appropriately, and if stool does not improve over today, she should return tomorrow.  Patient agreed to plan.

## 2014-06-21 NOTE — Telephone Encounter (Signed)
Patient called at 3 AM stating that she had had decreased urinary output. She had been urinating but less than previously. She was treated for vomiting and diarrhea and received IV fluids 36 hours previously.. She has not had any further vomiting or diarrhea since she was seen in the  office. I advised her to come in the morning of 06/21/2014 so we could check her urine and reevaluate her. She will continue to drink fluids.

## 2014-06-24 ENCOUNTER — Telehealth: Payer: Self-pay | Admitting: Emergency Medicine

## 2014-06-24 NOTE — Telephone Encounter (Signed)
I received a call at 10 PM last night the patient had had  left lower abdominal pain. She advised the pain was now gone she has had no fever no vomiting. I advised her if her pain recurred she needed to go to the emergency room for evaluation.

## 2014-06-27 ENCOUNTER — Ambulatory Visit (INDEPENDENT_AMBULATORY_CARE_PROVIDER_SITE_OTHER): Payer: Self-pay | Admitting: Neurology

## 2014-06-27 ENCOUNTER — Ambulatory Visit (INDEPENDENT_AMBULATORY_CARE_PROVIDER_SITE_OTHER): Payer: BC Managed Care – PPO | Admitting: Neurology

## 2014-06-27 DIAGNOSIS — R202 Paresthesia of skin: Secondary | ICD-10-CM

## 2014-06-27 DIAGNOSIS — Z0289 Encounter for other administrative examinations: Secondary | ICD-10-CM

## 2014-06-27 DIAGNOSIS — G609 Hereditary and idiopathic neuropathy, unspecified: Secondary | ICD-10-CM | POA: Diagnosis not present

## 2014-06-27 NOTE — Progress Notes (Signed)
  [facility]    Provider:  Dr Jaynee Eagles Referring Provider: Barton Fanny, MD Primary Care Physician:  Ellsworth Lennox, MD  CC: Numbness and tingling  HPI: Kendra Nguyen is a 34 y.o. female here as a referral from Dr. Leward Quan for numbness and tingling. He is lovely 34 year old female with morbid obesity and high cholesterol, high blood pressure. She is status post lap band surgery in 2013 and will have gastric sleeve this year. She reports numbness and tingling in the fingers and toes. Rubbing hands feels better. Last for several hours. Started months ago. Symptoms are daily. She has noticed it more lately and feels her symptoms are worsening. Symptoms started on the left side and are currently more severe on that side, the patient has started having the symptoms on the right as well but not as bad left side is worse. Lately she is getting right-sided numbness. Just the tips of the toes. Worse at night in bed. Worse Also when sitting. No radicular symptoms from the low back. Nothing helps it, it'll just go away on it own. No weakness. No vision changes. No inciting factors. No family history of autoimmune disorders. 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 Since last being seen, symptoms seem to have resolved.   Summary  Nerve conduction studies were performed on the bilateral upper and right lower extremities:  The bilateral Median motor nerves showed normal conductions with normal F Wave latencies The bilateral Ulnar motor nerves showed normal conductions with normal F Wave latencies The right Peroneal motor nerve showed normal conductions The right Tibial motor nerve showed normal conductions The bilateral second-digit Median sensory nerves were within normal limits The bilateral fifth-digit Ulnar sensory nerves were within normal limits The bilateral Sural sensory nerve conduction was within normal limits Right H Reflex showed normal latency The bilateral  Median/Ulnar (palm) comparison nerve studies showed normal peak latency differences  EMG Needle study was performed on selected left upper and left lower extremity muscles:   The Deltoid, Triceps, Pronator Teres, Opponens Pollicis, First Dorsal interosseous, Vastus Medialis, Anterior Tibialis, Medial Gastrocnemius, Extensor Hallucis Longus and Abductor Hallucis muscles were within normal limits.  Conclusion: This is a normal study. No electrophysiologic evidence for ulnar or median neuropathy, peripheral polyneuropathy or radiculopathy.   Sarina Ill, MD  [facility] [address] [place], [place] [address]  Phone [phone] Fax [phone]  .

## 2014-06-28 NOTE — Progress Notes (Signed)
See procedure note.

## 2014-06-30 DIAGNOSIS — R202 Paresthesia of skin: Secondary | ICD-10-CM | POA: Diagnosis not present

## 2014-06-30 DIAGNOSIS — M6289 Other specified disorders of muscle: Secondary | ICD-10-CM | POA: Diagnosis not present

## 2014-07-01 ENCOUNTER — Other Ambulatory Visit: Payer: Self-pay | Admitting: Neurology

## 2014-07-01 DIAGNOSIS — R531 Weakness: Secondary | ICD-10-CM

## 2014-07-01 DIAGNOSIS — G35 Multiple sclerosis: Secondary | ICD-10-CM

## 2014-07-01 DIAGNOSIS — R202 Paresthesia of skin: Secondary | ICD-10-CM

## 2014-07-02 NOTE — Procedures (Signed)
[facility]    Provider:  Dr Jaynee Eagles Referring Provider: Barton Fanny, MD Primary Care Physician:  Ellsworth Lennox, MD  CC: Numbness and tingling  HPI: Kendra Nguyen is a 34 y.o. female here as a referral from Dr. Leward Quan for numbness and tingling. Kendra Nguyen is lovely 34 year old female with morbid obesity and high cholesterol, high blood pressure. Kendra Nguyen is status post lap band surgery in 2013 and will have gastric sleeve this year. Kendra Nguyen reports numbness and tingling in the fingers and toes. Rubbing hands feels better. Last for several hours. Started months ago. Symptoms are daily. Kendra Nguyen has noticed it more lately and feels her symptoms are worsening. Symptoms started on the left side and are currently more severe on that side, the patient has started having the symptoms on the right as well but not as bad left side is worse. Lately Kendra Nguyen is getting right-sided numbness. Just the tips of the toes. Worse at night in bed. Worse Also when sitting. No radicular symptoms from the low back. Nothing helps it, it'll just go away on it own. No weakness. No vision changes. No inciting factors. No family history of autoimmune disorders. 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   Summary  Nerve conduction studies were performed on the bilateral upper and right lower extremities:  The bilateral Median motor nerves showed normal conductions with normal F Wave latencies The bilateral Ulnar motor nerves showed normal conductions with normal F Wave latencies The right Peroneal motor nerve showed normal conductions The right Tibial motor nerve showed normal conductions The bilateral second-digit Median sensory nerves were within normal limits The bilateral fifth-digit Ulnar sensory nerves were within normal limits The bilateral Sural sensory nerve conduction was within normal limits Right H Reflex showed normal latency The bilateral Median/Ulnar (palm) comparison nerve studies showed normal peak  latency differences  EMG Needle study was performed on selected left upper and left lower extremity muscles:   The Deltoid, Triceps, Pronator Teres, Opponens Pollicis, First Dorsal interosseous, Vastus Medialis, Anterior Tibialis, Medial Gastrocnemius, Extensor Hallucis Longus and Abductor Hallucis muscles were within normal limits.  Conclusion: This is a normal study. No electrophysiologic evidence for ulnar or median neuropathy, peripheral polyneuropathy or radiculopathy.   Sarina Ill, MD  [facility] [address] [place], [place] [address]

## 2014-07-05 ENCOUNTER — Telehealth: Payer: Self-pay | Admitting: *Deleted

## 2014-07-05 NOTE — Telephone Encounter (Signed)
Spoke with pt about MRI brain. Told her it was WNL. Offered for her to come in for f/u appt if she would like to see imaging but pt declined and stated "As long as it's WNL I don't need to come in, thank you". I told her to call back with any questions and we would call her for anything as well. Pt verbalized understanding.

## 2014-07-21 ENCOUNTER — Ambulatory Visit: Payer: Self-pay | Admitting: Physician Assistant

## 2014-07-23 ENCOUNTER — Ambulatory Visit (INDEPENDENT_AMBULATORY_CARE_PROVIDER_SITE_OTHER): Payer: BC Managed Care – PPO | Admitting: Physician Assistant

## 2014-07-23 VITALS — BP 124/86 | HR 89 | Temp 98.2°F | Resp 18 | Ht 67.0 in | Wt 353.0 lb

## 2014-07-23 DIAGNOSIS — K219 Gastro-esophageal reflux disease without esophagitis: Secondary | ICD-10-CM | POA: Diagnosis not present

## 2014-07-23 DIAGNOSIS — K625 Hemorrhage of anus and rectum: Secondary | ICD-10-CM

## 2014-07-23 LAB — COMPLETE METABOLIC PANEL WITH GFR
ALK PHOS: 58 U/L (ref 39–117)
ALT: 33 U/L (ref 0–35)
AST: 32 U/L (ref 0–37)
Albumin: 3.8 g/dL (ref 3.5–5.2)
BUN: 8 mg/dL (ref 6–23)
CO2: 24 mEq/L (ref 19–32)
CREATININE: 0.57 mg/dL (ref 0.50–1.10)
Calcium: 8.8 mg/dL (ref 8.4–10.5)
Chloride: 106 mEq/L (ref 96–112)
GFR, Est African American: >89 mL/min
Glucose, Bld: 112 mg/dL — ABNORMAL HIGH (ref 70–99)
Potassium: 3.9 mEq/L (ref 3.5–5.3)
SODIUM: 136 meq/L (ref 135–145)
Total Bilirubin: 0.9 mg/dL (ref 0.2–1.2)
Total Protein: 6.4 g/dL (ref 6.0–8.3)

## 2014-07-23 LAB — POCT CBC
Granulocyte percent: 64.7 %G (ref 37–80)
HCT, POC: 38.2 % (ref 37.7–47.9)
Hemoglobin: 12.7 g/dL (ref 12.2–16.2)
LYMPH, POC: 2.4 (ref 0.6–3.4)
MCH: 27.9 pg (ref 27–31.2)
MCHC: 33.2 g/dL (ref 31.8–35.4)
MCV: 84 fL (ref 80–97)
MID (CBC): 0.6 (ref 0–0.9)
MPV: 7.5 fL (ref 0–99.8)
POC Granulocyte: 5.6 (ref 2–6.9)
POC LYMPH PERCENT: 28.2 %L (ref 10–50)
POC MID %: 7.1 %M (ref 0–12)
Platelet Count, POC: 315 10*3/uL (ref 142–424)
RBC: 4.55 M/uL (ref 4.04–5.48)
RDW, POC: 13.3 %
WBC: 8.6 10*3/uL (ref 4.6–10.2)

## 2014-07-23 LAB — RETICULOCYTES
ABS Retic: 93.7 10*3/uL (ref 19.0–186.0)
RBC.: 4.46 MIL/uL (ref 3.87–5.11)
RETIC CT PCT: 2.1 % (ref 0.4–2.3)

## 2014-07-23 LAB — IFOBT (OCCULT BLOOD): IFOBT: NEGATIVE

## 2014-07-23 MED ORDER — OMEPRAZOLE 20 MG PO CPDR: 20.0000 mg | DELAYED_RELEASE_CAPSULE | Freq: Every day | ORAL | Status: DC

## 2014-07-23 NOTE — Progress Notes (Signed)
07/23/2014 at 9:21 AM  Kendra Nguyen / DOB: 07/26/1980 / MRN: [id number]  The patient has Morbid obesity; Pure hypercholesterolemia; Vitamin D deficiency; and Hereditary and idiopathic peripheral neuropathy on her problem list.  SUBJECTIVE  Chief complaint: Blood In Stools  Patient here today complaining of blood in her stool.  She has had this in the past and had a rectal exam and was told not to worry about it unless it happens again.  She first noticed some scant bright red blood a few days ago in the toilet.  Yesterday she reports seeing an undigested seed in her stool that was bloody, and she became worried and came in for evaluation.  She denies belly pain, nausea, and fever at this time.  She has heart burn two to three times per week and does not take anything for this.  She denies heavy NSAID use.  She denies rectal pain.  Her menstrual cycle is very normal and she has not been menstruating during the times she has seen blood in her stool.    She  has a past medical history of Hypertension; laparoscopic gastric banding; Varicose veins; and Hyperlipemia.    Medications reviewed and updated by myself where necessary, and exist elsewhere in the encounter.   Ms. Kendra Nguyen is allergic to neomycin. She  reports that she has never smoked. She has never used smokeless tobacco. She reports that she does not drink alcohol or use illicit drugs. She  has no sexual activity history on file. The patient  has past surgical history that includes Ankle fracture surgery and Laparoscopic gastric banding (04/17/2011).  Her family history includes Aneurysm (age of onset: 34) in her sister; Arthritis in her father; COPD in her father and paternal aunt; Cancer in her father; Diabetes in her mother; Heart disease in her mother; Hyperlipidemia in her father. There is no history of Neuropathy.  Review of Systems  Constitutional: Negative for fever and chills.  Respiratory: Negative for cough.   Cardiovascular:  Negative for chest pain.  Gastrointestinal: Positive for heartburn and blood in stool. Negative for nausea, vomiting, abdominal pain, diarrhea, constipation and melena.  Genitourinary: Negative for dysuria, urgency, frequency, hematuria and flank pain.  Skin: Negative for itching and rash.  Neurological: Negative for dizziness.    OBJECTIVE  Her  height is 5\' 7"  (1.702 m) and weight is 353 lb (160.12 kg). Her temperature is 98.2 F (36.8 C). Her blood pressure is 124/86 and her pulse is 89. Her respiration is 18 and oxygen saturation is 98%.  The patient's body mass index is 55.27 kg/(m^2).  Physical Exam  Constitutional: She is oriented to person, place, and time. She appears well-developed and well-nourished. No distress.  Eyes: Conjunctivae and EOM are normal. Pupils are equal, round, and reactive to light.  Neck: No thyromegaly present.  Cardiovascular: Normal rate.   Respiratory: Effort normal and breath sounds normal.  GI: Soft. She exhibits no distension and no mass. There is no tenderness. There is no rebound and no guarding.  Genitourinary:     Musculoskeletal: Normal range of motion.  Neurological: She is alert and oriented to person, place, and time. She has normal reflexes.  Skin: Skin is warm and dry. She is not diaphoretic.    Results for orders placed or performed in visit on 07/23/14 (from the past 24 hour(s))  POCT CBC     Status: None   Collection Time: 07/23/14  9:12 AM  Result Value Ref Range   WBC  8.6 4.6 - 10.2 K/uL   Lymph, poc 2.4 0.6 - 3.4   POC LYMPH PERCENT 28.2 10 - 50 %L   MID (cbc) 0.6 0 - 0.9   POC MID % 7.1 0 - 12 %M   POC Granulocyte 5.6 2 - 6.9   Granulocyte percent 64.7 37 - 80 %G   RBC 4.55 4.04 - 5.48 M/uL   Hemoglobin 12.7 12.2 - 16.2 g/dL   HCT, POC 38.2 37.7 - 47.9 %   MCV 84.0 80 - 97 fL   MCH, POC 27.9 27 - 31.2 pg   MCHC 33.2 31.8 - 35.4 g/dL   RDW, POC 13.3 %   Platelet Count, POC 315 142 - 424 K/uL   MPV 7.5 0 - 99.8 fL    IFOBT POC (occult bld, rslt in office)     Status: None   Collection Time: 07/23/14  9:12 AM  Result Value Ref Range   IFOBT Negative     ASSESSMENT & PLAN   Lizzett was seen today for blood in stools.  Diagnoses and all orders for this visit:  BRBPR (bright red blood per rectum): Patient with two complaints of BRBPR in the last 365 days. She is not taking ASA or NSAIDS and has no documented bleeding disorder.  Her work up is negative and abdominal exam is reassuring.  Her DRE is negative for hemorrhoids and blood.   Given that I can not explain her symptoms I am referring this patient for colonoscopy.   Orders: -     POCT CBC -     COMPLETE METABOLIC PANEL WITH GFR -     Reticulocytes -     IFOBT POC (occult bld, rslt in office)  GERD: Will treat with prilosec OTC.  She has no abdominal tenderness and she has no anemia on Nguyen, and I doubt this would be causing scant BRBPR.      The patient was advised to call or come back to clinic if she does not see an improvement in symptoms, or worsens with the above plan.   Philis Fendt, MHS, PA-C [facility] 07/23/2014 9:21 AM

## 2014-07-23 NOTE — Patient Instructions (Signed)

## 2014-07-27 ENCOUNTER — Encounter: Payer: Self-pay | Admitting: Gastroenterology

## 2014-07-28 ENCOUNTER — Telehealth: Payer: Self-pay

## 2014-07-28 NOTE — Telephone Encounter (Signed)
Patient was informed of normal lab results.  She verbalized understanding

## 2014-09-01 ENCOUNTER — Ambulatory Visit: Payer: Self-pay | Admitting: Physician Assistant

## 2014-09-29 ENCOUNTER — Ambulatory Visit: Payer: BC Managed Care – PPO | Admitting: Gastroenterology

## 2014-10-31 ENCOUNTER — Ambulatory Visit (INDEPENDENT_AMBULATORY_CARE_PROVIDER_SITE_OTHER): Payer: BC Managed Care – PPO | Admitting: Physician Assistant

## 2014-10-31 ENCOUNTER — Encounter: Payer: Self-pay | Admitting: Physician Assistant

## 2014-10-31 VITALS — BP 144/86 | HR 93 | Temp 98.5°F | Resp 16 | Ht 66.0 in | Wt 309.6 lb

## 2014-10-31 DIAGNOSIS — E7800 Pure hypercholesterolemia, unspecified: Secondary | ICD-10-CM

## 2014-10-31 DIAGNOSIS — Z23 Encounter for immunization: Secondary | ICD-10-CM | POA: Diagnosis not present

## 2014-10-31 DIAGNOSIS — E559 Vitamin D deficiency, unspecified: Secondary | ICD-10-CM | POA: Diagnosis not present

## 2014-10-31 NOTE — Patient Instructions (Signed)
Return in the next 2 weeks for lab work. Please attempt to take probiotic supplement, or more yogurt. I will let you know the results, and we will go from there.

## 2014-11-01 ENCOUNTER — Encounter: Payer: Self-pay | Admitting: Physician Assistant

## 2014-11-01 NOTE — Progress Notes (Signed)
[facility] [address] [place] [address] [phone]  Date:  10/31/2014   Name:  Kendra Nguyen   DOB:  1980/03/08   MRN:  [id number]  PCP:  Ellsworth Lennox, MD    History of Present Illness:  Kendra Nguyen is a 34 y.o. female patient who presents to [facility] for chief complaint of prolonged bruising, hypercholesterolemia, and flu vaccine. Patient reports that she has bruises that appear to be there for over 3 months.   Patient was paint balling and had a bruise that was seen about 5 months ago.  She has no pain in the area or swelling, but continues to have bruising at the site.  She has no bleeds or easy bruising that she has known.  Heparin given during gastric surgery where some of the blemishes are present.   She is also here for cholesterol recheck.  She is compliant on medication and does not notice any side effects.  She has no chest pains, palpitations, leg swelling, sob, or dyspnea. She had gastro-surgery in July with lap band removed, and gastric surgery.  She has lost about 50lbs thus far.  Vitamin D deficiency: She has been having difficulty swallowing the pills, and has not taken in months.  Last Vitamin D 18.          Patient Active Problem List   Diagnosis Date Noted  . 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 Pure hypercholesterolemia 12/15/2013  . Vitamin D deficiency 12/15/2013  . Morbid obesity ([place]) 03/21/2011    Past Medical History  Diagnosis Date  . Hypertension   . Hx of laparoscopic gastric banding   . Varicose veins   . Hyperlipemia     Past Surgical History  Procedure Laterality Date  . Ankle fracture surgery    . Laparoscopic gastric banding  04/17/2011    Social History  Substance Use Topics  . Smoking status: Never Smoker   . Smokeless tobacco: Never Used  . Alcohol Use: No    Family History  Problem Relation Age of Onset  . Diabetes Mother   . Heart disease Mother   . Arthritis  Father     gout  . COPD Father     smoker  . Hyperlipidemia Father   . Cancer Father   . Aneurysm Sister 34    cerebral; some memory loss  . COPD Paternal 76   . Neuropathy Neg Hx     Allergies  Allergen Reactions  . Neomycin Rash    Medication list has been reviewed and updated.  Current Outpatient Prescriptions on File Prior to Visit  Medication Sig Dispense Refill  . ergocalciferol (DRISDOL) 50000 UNITS capsule Take 1 capsule (50,000 Units total) by mouth once a week. 4 capsule 7  . omeprazole (PRILOSEC) 20 MG capsule Take 1 capsule (20 mg total) by mouth daily. 30 capsule 3  . simvastatin (ZOCOR) 20 MG tablet Take 1 tablet (20 mg total) by mouth daily. 30 tablet 5   No current facility-administered medications on file prior to visit.    ROS ROS otherwise unremarkable unless listed above.   Physical Examination: BP 144/86 mmHg  Pulse 93  Temp(Src) 98.5 F (36.9 C) (Oral)  Resp 16  Ht 5\' 6"  (1.676 m)  Wt 309 lb 9.6 oz (140.434 kg)  BMI 49.99 kg/m2  SpO2 99%  LMP 10/18/2014 Ideal Body Weight: Weight in (lb) to have BMI = 25: 154.6  Physical Exam  Constitutional: She is  oriented to person, place, and time. She appears well-developed and well-nourished. No distress.  HENT:  Head: Normocephalic and atraumatic.  Right Ear: External ear normal.  Left Ear: External ear normal.  Eyes: Conjunctivae and EOM are normal. Pupils are equal, round, and reactive to light.  Cardiovascular: Normal rate.   Pulmonary/Chest: Effort normal. No respiratory distress.  Neurological: She is alert and oriented to person, place, and time.  Skin: She is not diaphoretic.  Hyperpigmented non-tender, and fading bruises along her right arm.  Right inner thigh, and left arm.    Psychiatric: She has a normal mood and affect. Her behavior is normal.     Assessment and Plan: Kendra Nguyen is a 34 y.o. female who is here today for flu vaccine, cholesterol recheck, and bruising that is  not healing. -flu vaccine given -will do fasting lipid panel in future order so she can come in the morning. -vitamin D will be rechecked at that time.  We may need to consider vitamin d injections if she is unable to handle vitamin D tablets at this time.  Will await supplement plan with results. -bruising may likely take time due to habitus.  We will monitor the sites at this time.      1. Flu vaccine need - Flu Vaccine QUAD 36+ mos IM  2. Vitamin D deficiency - Vit D  25 hydroxy (rtn osteoporosis monitoring); Future  3. Pure hypercholesterolemia - Lipid panel; Future   Ivar Drape, PA-C [facility] 11/01/2014 4:30 PM

## 2014-11-09 ENCOUNTER — Ambulatory Visit (INDEPENDENT_AMBULATORY_CARE_PROVIDER_SITE_OTHER): Payer: BC Managed Care – PPO | Admitting: Gastroenterology

## 2014-11-09 ENCOUNTER — Encounter: Payer: Self-pay | Admitting: Gastroenterology

## 2014-11-09 VITALS — BP 124/84 | HR 88 | Ht 65.5 in | Wt 309.0 lb

## 2014-11-09 DIAGNOSIS — K625 Hemorrhage of anus and rectum: Secondary | ICD-10-CM | POA: Diagnosis not present

## 2014-11-09 MED ORDER — NA SULFATE-K SULFATE-MG SULF 17.5-3.13-1.6 GM/177ML PO SOLN: ORAL | Status: DC

## 2014-11-09 NOTE — Progress Notes (Signed)
HPI: This is a   whovery pleasant 34 year old woman was referred to me by Tereasa Coop, PA-C  to evalrectal bleeding .    Chief complaint is  rectal bleeding  Saw rectal bleeding several months ago.  Was a small amount.  Around one stool and also on tissue paper.    Neg FOBT  Around that time.  No anal discomfort.  Had gastric sleeve 07/2014 (following band 2013);  Lost 50 pounds in 3 months.  No colon cancer in family but dad had gastic cancer.  Has had consitpation since sleeve, hard to drink water.  Review of systems: Pertinent positive and negative review of systems were noted in the above HPI section. Complete review of systems was performed and was otherwise normal.   Past Medical History  Diagnosis Date  . Hypertension   . Hx of laparoscopic gastric banding   . Varicose veins   . Hyperlipemia   . Morbid obesity with BMI of 45.0-49.9, adult [facility])     Past Surgical History  Procedure Laterality Date  . Ankle fracture surgery    . Laparoscopic gastric banding  04/17/2011    Current Outpatient Prescriptions  Medication Sig Dispense Refill  . omeprazole (PRILOSEC) 20 MG capsule Take 1 capsule (20 mg total) by mouth daily. 30 capsule 3  . simvastatin (ZOCOR) 20 MG tablet Take 1 tablet (20 mg total) by mouth daily. 30 tablet 5   No current facility-administered medications for this visit.    Allergies as of 11/09/2014 - Review Complete 11/09/2014  Allergen Reaction Noted  . Neomycin Rash 03/21/2011    Family History  Problem Relation Age of Onset  . Diabetes Mother   . Heart disease Mother   . Arthritis Father     gout  . COPD Father     smoker  . Hyperlipidemia Father   . Cancer Father   . Aneurysm Sister 34    cerebral; some memory loss  . COPD Paternal 62   . Neuropathy Neg Hx     Social History   Social History  . Marital Status: Single    Spouse Name: N/A  . Number of Children: N/A  . Years of Education: Master's   Occupational History   . Teacher- middle school    Social History Main Topics  . Smoking status: Never Smoker   . Smokeless tobacco: Never Used  . Alcohol Use: No  . Drug Use: No  . Sexual Activity: Not on file   Other Topics Concern  . Not on file   Social History Narrative   Lives at home by herself.   Caffeine use: Soda occass [name])        Physical Exam: BP 124/84 mmHg  Pulse 88  Ht 5' 5.5" (1.664 m)  Wt 309 lb (140.161 kg)  BMI 50.62 kg/m2  LMP 10/18/2014 Constitutional: generally well-appearing Psychiatric: alert and oriented x3 Eyes: extraocular movements intact Mouth: oral pharynx moist, no lesions Neck: supple no lymphadenopathy Cardiovascular: heart regular rate and rhythm Lungs: clear to auscultation bilaterally Abdomen: soft, nontender, nondistended, no obvious ascites, no peritoneal signs, normal bowel sounds Extremities: no lower extremity edema bilaterally Skin: no lesions on visible extremities  rectal examination deferred for upcoming colonoscopy   Assessment and plan: 34 y.o. female with rectal bleeding  I think it is unlikely this is from anything serious. Most likely anal rectal benign origin. I did recommend we proceed with colonoscopy at her soonest convenience to exclude other causes such as neoplasm.  She has a BMI of 50 and so does not qualify for endoscopy at our outpatient facility. We will do this at the hospital at her soonest convenience.  Owens Loffler, MD [facility] 11/09/2014, 2:33 PM  Cc: Tereasa Coop, PA-C

## 2014-11-09 NOTE — Patient Instructions (Addendum)
You will be set up for a colonoscopy at [facility] with MAC sedation for rectal bleeding.  You have been scheduled for a colonoscopy. Please follow written instructions given to you at your visit today.  Please pick up your prep supplies at the pharmacy within the next 1-3 days. If you use inhalers (even only as needed), please bring them with you on the day of your procedure. Your physician has requested that you go to www.startemmi.com and enter the access code given to you at your visit today. This web site gives a general overview about your procedure. However, you should still follow specific instructions given to you by our office regarding your preparation for the procedure.  We have sent the following medications to your pharmacy for you to pick up at your convenience: Suprep

## 2014-11-17 ENCOUNTER — Encounter (HOSPITAL_COMMUNITY): Payer: Self-pay | Admitting: *Deleted

## 2014-11-18 ENCOUNTER — Ambulatory Visit: Payer: BC Managed Care – PPO | Admitting: Gastroenterology

## 2014-11-24 ENCOUNTER — Ambulatory Visit (HOSPITAL_COMMUNITY): Payer: BC Managed Care – PPO | Admitting: Anesthesiology

## 2014-11-24 ENCOUNTER — Ambulatory Visit (HOSPITAL_COMMUNITY)
Admission: RE | Admit: 2014-11-24 | Discharge: 2014-11-24 | Disposition: A | Discharge status: Home / Self Care | Payer: BC Managed Care – PPO | Source: Ambulatory Visit | Attending: Gastroenterology | Admitting: Gastroenterology

## 2014-11-24 ENCOUNTER — Encounter (HOSPITAL_COMMUNITY)
Admission: RE | Disposition: A | Discharge status: Home / Self Care | Payer: Self-pay | Source: Ambulatory Visit | Attending: Gastroenterology

## 2014-11-24 ENCOUNTER — Encounter (HOSPITAL_COMMUNITY): Payer: Self-pay

## 2014-11-24 DIAGNOSIS — Z9884 Bariatric surgery status: Secondary | ICD-10-CM | POA: Diagnosis not present

## 2014-11-24 DIAGNOSIS — K921 Melena: Secondary | ICD-10-CM

## 2014-11-24 DIAGNOSIS — Z6841 Body Mass Index (BMI) 40.0 and over, adult: Secondary | ICD-10-CM | POA: Insufficient documentation

## 2014-11-24 DIAGNOSIS — I10 Essential (primary) hypertension: Secondary | ICD-10-CM | POA: Diagnosis not present

## 2014-11-24 DIAGNOSIS — K219 Gastro-esophageal reflux disease without esophagitis: Secondary | ICD-10-CM | POA: Insufficient documentation

## 2014-11-24 DIAGNOSIS — Z79899 Other long term (current) drug therapy: Secondary | ICD-10-CM | POA: Insufficient documentation

## 2014-11-24 DIAGNOSIS — E785 Hyperlipidemia, unspecified: Secondary | ICD-10-CM | POA: Diagnosis not present

## 2014-11-24 DIAGNOSIS — K625 Hemorrhage of anus and rectum: Secondary | ICD-10-CM | POA: Insufficient documentation

## 2014-11-24 HISTORY — PX: COLONOSCOPY WITH PROPOFOL: SHX5780

## 2014-11-24 SURGERY — COLONOSCOPY WITH PROPOFOL
Anesthesia: Monitor Anesthesia Care

## 2014-11-24 MED ORDER — PROPOFOL 10 MG/ML IV BOLUS
INTRAVENOUS | Status: AC
  Filled 2014-11-24: qty 20

## 2014-11-24 MED ORDER — PROPOFOL 10 MG/ML IV BOLUS
INTRAVENOUS | Status: AC
  Filled 2014-11-24: qty 40

## 2014-11-24 MED ORDER — SODIUM CHLORIDE 0.9 % IV SOLN: INTRAVENOUS | Status: DC

## 2014-11-24 MED ORDER — PROPOFOL 10 MG/ML IV BOLUS
INTRAVENOUS | Status: DC | PRN
  Administered 2014-11-24: 20 mg via INTRAVENOUS
  Administered 2014-11-24 (×2): 30 mg via INTRAVENOUS
  Administered 2014-11-24: 20 mg via INTRAVENOUS
  Administered 2014-11-24: 40 mg via INTRAVENOUS
  Administered 2014-11-24: 30 mg via INTRAVENOUS
  Administered 2014-11-24: 40 mg via INTRAVENOUS
  Administered 2014-11-24: 20 mg via INTRAVENOUS
  Administered 2014-11-24: 30 mg via INTRAVENOUS
  Administered 2014-11-24: 40 mg via INTRAVENOUS
  Administered 2014-11-24: 50 mg via INTRAVENOUS
  Administered 2014-11-24: 20 mg via INTRAVENOUS
  Administered 2014-11-24 (×2): 30 mg via INTRAVENOUS
  Administered 2014-11-24: 20 mg via INTRAVENOUS

## 2014-11-24 MED ORDER — LACTATED RINGERS IV SOLN
INTRAVENOUS | Status: DC
  Administered 2014-11-24: 1000 mL via INTRAVENOUS
  Administered 2014-11-24: 09:00:00 via INTRAVENOUS

## 2014-11-24 SURGICAL SUPPLY — 21 items

## 2014-11-24 NOTE — Anesthesia Postprocedure Evaluation (Signed)
  Anesthesia Post-op Note  Patient: Kendra Nguyen  Procedure(s) Performed: Procedure(s): COLONOSCOPY WITH PROPOFOL (N/A)  Patient Location: Endoscopy Unit  Anesthesia Type:MAC  Level of Consciousness: awake, alert , oriented and patient cooperative  Airway and Oxygen Therapy: Patient Spontanous Breathing  Post-op Pain: none  Post-op Assessment: Post-op Vital signs reviewed, Patient's Cardiovascular Status Stable, Respiratory Function Stable, Patent Airway, No signs of Nausea or vomiting and Pain level controlled              Post-op Vital Signs: Reviewed and stable  Last Vitals:  Filed Vitals:   11/24/14 1000  BP: 122/64  Pulse: 70  Temp:   Resp: 21    Complications: No apparent anesthesia complications

## 2014-11-24 NOTE — H&P (View-Only) (Signed)
HPI: This is a   whovery pleasant 34 year old woman was referred to me by Tereasa Coop, PA-C  to evalrectal bleeding .    Chief complaint is  rectal bleeding  Saw rectal bleeding several months ago.  Was a small amount.  Around one stool and also on tissue paper.    Neg FOBT  Around that time.  No anal discomfort.  Had gastric sleeve 07/2014 (following band 2013);  Lost 50 pounds in 3 months.  No colon cancer in family but dad had gastic cancer.  Has had consitpation since sleeve, hard to drink water.  Review of systems: Pertinent positive and negative review of systems were noted in the above HPI section. Complete review of systems was performed and was otherwise normal.   Past Medical History  Diagnosis Date  . Hypertension   . Hx of laparoscopic gastric banding   . Varicose veins   . Hyperlipemia   . Morbid obesity with BMI of 45.0-49.9, adult [facility])     Past Surgical History  Procedure Laterality Date  . Ankle fracture surgery    . Laparoscopic gastric banding  04/17/2011    Current Outpatient Prescriptions  Medication Sig Dispense Refill  . omeprazole (PRILOSEC) 20 MG capsule Take 1 capsule (20 mg total) by mouth daily. 30 capsule 3  . simvastatin (ZOCOR) 20 MG tablet Take 1 tablet (20 mg total) by mouth daily. 30 tablet 5   No current facility-administered medications for this visit.    Allergies as of 11/09/2014 - Review Complete 11/09/2014  Allergen Reaction Noted  . Neomycin Rash 03/21/2011    Family History  Problem Relation Age of Onset  . Diabetes Mother   . Heart disease Mother   . Arthritis Father     gout  . COPD Father     smoker  . Hyperlipidemia Father   . Cancer Father   . Aneurysm Sister 34    cerebral; some memory loss  . COPD Paternal 43   . Neuropathy Neg Hx     Social History   Social History  . Marital Status: Single    Spouse Name: N/A  . Number of Children: N/A  . Years of Education: Master's   Occupational History   . Teacher- middle school    Social History Main Topics  . Smoking status: Never Smoker   . Smokeless tobacco: Never Used  . Alcohol Use: No  . Drug Use: No  . Sexual Activity: Not on file   Other Topics Concern  . Not on file   Social History Narrative   Lives at home by herself.   Caffeine use: Soda occass [name])        Physical Exam: BP 124/84 mmHg  Pulse 88  Ht 5' 5.5" (1.664 m)  Wt 309 lb (140.161 kg)  BMI 50.62 kg/m2  LMP 10/18/2014 Constitutional: generally well-appearing Psychiatric: alert and oriented x3 Eyes: extraocular movements intact Mouth: oral pharynx moist, no lesions Neck: supple no lymphadenopathy Cardiovascular: heart regular rate and rhythm Lungs: clear to auscultation bilaterally Abdomen: soft, nontender, nondistended, no obvious ascites, no peritoneal signs, normal bowel sounds Extremities: no lower extremity edema bilaterally Skin: no lesions on visible extremities  rectal examination deferred for upcoming colonoscopy   Assessment and plan: 34 y.o. female with rectal bleeding  I think it is unlikely this is from anything serious. Most likely anal rectal benign origin. I did recommend we proceed with colonoscopy at her soonest convenience to exclude other causes such as neoplasm.  She has a BMI of 50 and so does not qualify for endoscopy at our outpatient facility. We will do this at the hospital at her soonest convenience.  Owens Loffler, MD [facility] 11/09/2014, 2:33 PM  Cc: Tereasa Coop, PA-C

## 2014-11-24 NOTE — Interval H&P Note (Signed)
History and Physical Interval Note:  11/24/2014 9:11 AM  Kendra Nguyen  has presented today for surgery, with the diagnosis of rectal bleeding  The various methods of treatment have been discussed with the patient and family. After consideration of risks, benefits and other options for treatment, the patient has consented to  Procedure(s): COLONOSCOPY WITH PROPOFOL (N/A) as a surgical intervention .  The patient's history has been reviewed, patient examined, no change in status, stable for surgery.  I have reviewed the patient's chart and Nguyen.  Questions were answered to the patient's satisfaction.     Milus Banister

## 2014-11-24 NOTE — Discharge Instructions (Signed)

## 2014-11-24 NOTE — Op Note (Signed)
[facility] [place], [id number]   COLONOSCOPY PROCEDURE REPORT  PATIENT: Kendra, Nguyen  MR#: [id number] BIRTHDATE: [birth year] , 34  yrs. old GENDER: female ENDOSCOPIST: Milus Banister, MD REFERRED [id number] McPherson, M.D. PROCEDURE DATE:  11/24/2014 PROCEDURE:   Colonoscopy, diagnostic First Screening Colonoscopy - Avg.  risk and is 50 yrs.  old or older - No.  Prior Negative Screening - Now for repeat screening. N/A  History of Adenoma - Now for follow-up colonoscopy & has been > or = to 3 yrs.  N/A  Recommend repeat exam, <10 yrs? No ASA CLASS:   Class II INDICATIONS:minor rectal bleeding. MEDICATIONS: Monitored anesthesia care  DESCRIPTION OF PROCEDURE:   After the risks benefits and alternatives of the procedure were thoroughly explained, informed consent was obtained.  The digital rectal exam revealed no abnormalities of the rectum.   The Pentax Ped Colon [id number] endoscope was introduced through the anus and advanced to the cecum, which was identified by both the appendix and ileocecal valve. No adverse events experienced.   The quality of the prep was excellent.  The instrument was then slowly withdrawn as the colon was fully examined. Estimated blood loss is zero unless otherwise noted in this procedure report.      COLON FINDINGS: The examination was normal.  Retroflexed views revealed no abnormalities. The time to cecum = 5 min Withdrawal time = 10 min   The scope was withdrawn and the procedure completed. COMPLICATIONS: There were no immediate complications.  ENDOSCOPIC IMPRESSION: The examination was normal, no polyps or cancer.  RECOMMENDATIONS: Your minor rectal bleeding was likely from small hemorrhoids that have since resolved. Colon cancer screening, starting at age 49.  eSigned:  Milus Banister, MD 11/24/2014 9:46 AM

## 2014-11-24 NOTE — Anesthesia Preprocedure Evaluation (Addendum)
Anesthesia Evaluation  Patient identified by MRN, date of birth, ID band Patient awake    Reviewed: Allergy & Precautions, NPO status , Patient's Chart, lab work & pertinent test results  History of Anesthesia Complications Negative for: history of anesthetic complications  Airway Mallampati: I  TM Distance: >3 FB Neck ROM: Full    Dental  (+) Dental Advisory Given   Pulmonary neg pulmonary ROS,    breath sounds clear to auscultation       Cardiovascular hypertension (no meds needed),  Rhythm:Regular Rate:Normal     Neuro/Psych negative neurological ROS     GI/Hepatic Neg liver ROS, GERD  Medicated and Controlled,  Endo/Other  Morbid obesityS/p lap band  Renal/GU negative Renal ROS     Musculoskeletal   Abdominal (+) + obese,   Peds  Hematology negative hematology ROS (+)   Anesthesia Other Findings   Reproductive/Obstetrics LMP 2 weeks ago                            Anesthesia Physical Anesthesia Plan  ASA: II  Anesthesia Plan: MAC   Post-op Pain Management:    Induction: Intravenous  Airway Management Planned: Natural Airway  Additional Equipment:   Intra-op Plan:   Post-operative Plan:   Informed Consent: I have reviewed the patients History and Physical, chart, labs and discussed the procedure including the risks, benefits and alternatives for the proposed anesthesia with the patient or authorized representative who has indicated his/her understanding and acceptance.   Dental advisory given  Plan Discussed with: CRNA and Surgeon  Anesthesia Plan Comments: (Plan routine monitors, MAC)        Anesthesia Quick Evaluation

## 2014-11-24 NOTE — Transfer of Care (Signed)
Immediate Anesthesia Transfer of Care Note  Patient: Kendra Nguyen  Procedure(s) Performed: Procedure(s): COLONOSCOPY WITH PROPOFOL (N/A)  Patient Location: PACU  Anesthesia Type:MAC  Level of Consciousness: sedated  Airway & Oxygen Therapy: Patient Spontanous Breathing and Patient connected to nasal cannula oxygen  Post-op Assessment: Report given to RN and Post -op Vital signs reviewed and stable  Post vital signs: Reviewed and stable  Last Vitals:  Filed Vitals:   11/24/14 0903  BP: 153/98  Pulse: 72  Temp: 36.5 C  Resp: 18    Complications: No apparent anesthesia complications

## 2014-11-25 ENCOUNTER — Encounter (HOSPITAL_COMMUNITY): Payer: Self-pay | Admitting: Gastroenterology

## 2014-11-28 ENCOUNTER — Other Ambulatory Visit: Payer: Self-pay

## 2014-11-28 MED ORDER — SIMVASTATIN 20 MG PO TABS: 20.0000 mg | ORAL_TABLET | Freq: Every day | ORAL | Status: DC

## 2014-12-18 ENCOUNTER — Ambulatory Visit (INDEPENDENT_AMBULATORY_CARE_PROVIDER_SITE_OTHER): Payer: BC Managed Care – PPO | Admitting: Physician Assistant

## 2014-12-18 VITALS — BP 112/78 | HR 64 | Temp 98.4°F | Resp 16 | Ht 66.0 in | Wt 300.0 lb

## 2014-12-18 DIAGNOSIS — A499 Bacterial infection, unspecified: Secondary | ICD-10-CM | POA: Diagnosis not present

## 2014-12-18 DIAGNOSIS — Z9884 Bariatric surgery status: Secondary | ICD-10-CM | POA: Insufficient documentation

## 2014-12-18 DIAGNOSIS — N760 Acute vaginitis: Secondary | ICD-10-CM | POA: Diagnosis not present

## 2014-12-18 DIAGNOSIS — N898 Other specified noninflammatory disorders of vagina: Secondary | ICD-10-CM

## 2014-12-18 DIAGNOSIS — L298 Other pruritus: Secondary | ICD-10-CM

## 2014-12-18 DIAGNOSIS — B9689 Other specified bacterial agents as the cause of diseases classified elsewhere: Secondary | ICD-10-CM

## 2014-12-18 DIAGNOSIS — L259 Unspecified contact dermatitis, unspecified cause: Secondary | ICD-10-CM | POA: Diagnosis not present

## 2014-12-18 LAB — POCT WET + KOH PREP
TRICH BY WET PREP: ABSENT
YEAST BY WET PREP: ABSENT
Yeast by KOH: ABSENT

## 2014-12-18 LAB — POCT GLYCOSYLATED HEMOGLOBIN (HGB A1C): Hemoglobin A1C: 5.4

## 2014-12-18 LAB — HEMOGLOBIN A1C: HEMOGLOBIN A1C: 5.4 % (ref 4.0–6.0)

## 2014-12-18 MED ORDER — METRONIDAZOLE 500 MG PO TABS: 500.0000 mg | ORAL_TABLET | Freq: Two times a day (BID) | ORAL | Status: DC

## 2014-12-18 NOTE — Progress Notes (Signed)
[facility] [address] [place] [address] [phone]  Date:  12/18/2014   Name:  Kendra Nguyen   DOB:  03-08-1980   MRN:  [id number]  PCP:  Ellsworth Lennox, MD    Chief Complaint: Vaginal Discharge   History of Present Illness:  This is a 34 y.o. female with PMH morbid obesity s/p gastric bypass 07/2014, HLD, GERD who is presenting with vaginal irritation x 3 days. Pt states for the past 11 months she has vaginal irritation on the 3rd day of her period. She gets itching and burning. Never really gets discharge. Sometimes she will take a diflucan pill but even if she doesn't take anything her sx resolve on own after 4 days. LMP 12/13/14 and period stopped yesterday. She is not sexually active- last sexually active 1 year ago. She had STD testing including g/c and trich 05/2014 and all negative. She denies dysuria, abdominal pain, back pain, n/v, genital sores, vaginal odor. She has never had bv before. She uses always unscented pads. Never uses tampons. Uses dial body wash. Doesn't put soap in her vagina but does clean between labia minora. She has lost a total of 60 pounds since gastric bypass surgery. She is eating healthier and trying to get a more regular exercise routine.  Review of Systems:  Review of Systems See HPI  Patient Active Problem List   Diagnosis Date Noted  . 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 Pure hypercholesterolemia 12/15/2013  . Vitamin D deficiency 12/15/2013  . Morbid obesity ([place]) 03/21/2011    Prior to Admission medications   Medication Sig Start Date End Date Taking? Authorizing Provider  omeprazole (PRILOSEC) 20 MG capsule Take 1 capsule (20 mg total) by mouth daily. 07/23/14  Yes Tereasa Coop, PA-C  simvastatin (ZOCOR) 20 MG tablet Take 1 tablet (20 mg total) by mouth daily. 11/28/14  Yes Stephanie D English, PA  ursodiol (ACTIGALL) 300 MG capsule Take 300 mg by mouth daily. 09/08/14 09/08/15 Yes  Historical Provider, MD    Allergies  Allergen Reactions  . Tape     rash  . Neomycin Rash    Past Surgical History  Procedure Laterality Date  . Ankle fracture surgery    . Laparoscopic gastric banding  04/17/2011  . Laparoscopic gastric sleeve resection      2016  . Colonoscopy with propofol N/A 11/24/2014    Procedure: COLONOSCOPY WITH PROPOFOL;  Surgeon: Milus Banister, MD;  Location: [facility];  Service: Endoscopy;  Laterality: N/A;    Social History  Substance Use Topics  . Smoking status: Never Smoker   . Smokeless tobacco: Never Used  . Alcohol Use: No    Family History  Problem Relation Age of Onset  . Diabetes Mother   . Heart disease Mother   . Arthritis Father     gout  . COPD Father     smoker  . Hyperlipidemia Father   . Cancer Father   . Aneurysm Sister 34    cerebral; some memory loss  . COPD Paternal 33   . Neuropathy Neg Hx     Medication list has been reviewed and updated.  Physical Examination:  Physical Exam  Constitutional: She is oriented to person, place, and time. She appears well-developed and well-nourished. No distress.  HENT:  Head: Normocephalic and atraumatic.  Right Ear: Hearing normal.  Left Ear: Hearing normal.  Nose: Nose normal.  Eyes: Conjunctivae and lids are normal. Right eye exhibits  no discharge. Left eye exhibits no discharge. No scleral icterus.  Pulmonary/Chest: Effort normal. No respiratory distress.  Genitourinary:  Scant discharge is vaginal vault, no odor present. Erythema inside bilateral labia majora. No tenderness. Vaginal tissue looks almost a little atrophic.  Musculoskeletal: Normal range of motion.  Neurological: She is alert and oriented to person, place, and time.  Skin: Skin is warm, dry and intact. No lesion and no rash noted.  Psychiatric: She has a normal mood and affect. Her speech is normal and behavior is normal. Thought content normal.   BP 112/78 mmHg  Pulse 64  Temp(Src) 98.4 F  (36.9 C) (Oral)  Resp 16  Ht 5\' 6"  (1.676 m)  Wt 300 lb (136.079 kg)  BMI 48.44 kg/m2  SpO2 99%  LMP 12/13/2014  Results for orders placed or performed in visit on 12/18/14  POCT Wet + KOH Prep  Result Value Ref Range   Yeast by KOH Absent Present, Absent   Yeast by wet prep Absent Present, Absent   WBC by wet prep Few None, Few, Too numerous to count   Clue Cells Wet Prep HPF POC Few (A) None, Too numerous to count   Trich by wet prep Absent Present, Absent   Bacteria Wet Prep HPF POC Moderate (A) None, Few, Too numerous to count   Epithelial Cells By [employer] Pref ([employer]) Moderate (A) None, Few, Too numerous to count   RBC,UR,HPF,POC None None RBC/hpf  POCT glycosylated hemoglobin (Hb A1C)  Result Value Ref Range   Hemoglobin A1C 5.4     Assessment and Plan:  1. BV (bacterial vaginosis) 2. Vaginal itching 3. Contact dermatitis Suspect a combination of BV and contact dermatitis -- both triggered by periods. Will treat with metronidazole. She will switch to 7th generation pads. She will switch to dove unscented soap and will no longer clean between labia minora. Return if symptoms not improved after next period. - metroNIDAZOLE (FLAGYL) 500 MG tablet; Take 1 tablet (500 mg total) by mouth 2 (two) times daily.  Dispense: 14 tablet; Refill: 0 - POCT Wet + KOH Prep - POCT glycosylated hemoglobin (Hb A1C)   Benjaman Pott. Drenda Freeze, MHS [facility]  12/18/2014

## 2014-12-18 NOTE — Patient Instructions (Signed)
Start using dove unscented soap. 7th generation pads and pantiliners. Take metronidazole twice a day for 7 days. No alcohol with this. Take with food.

## 2015-01-05 ENCOUNTER — Encounter: Payer: Self-pay | Admitting: Family Medicine

## 2015-01-12 ENCOUNTER — Ambulatory Visit (INDEPENDENT_AMBULATORY_CARE_PROVIDER_SITE_OTHER): Payer: BC Managed Care – PPO | Admitting: Physician Assistant

## 2015-01-12 VITALS — BP 122/76 | HR 79 | Temp 97.4°F | Resp 16 | Ht 66.0 in | Wt 295.0 lb

## 2015-01-12 DIAGNOSIS — J069 Acute upper respiratory infection, unspecified: Secondary | ICD-10-CM

## 2015-01-12 MED ORDER — IPRATROPIUM BROMIDE 0.03 % NA SOLN: 2.0000 | Freq: Two times a day (BID) | NASAL | Status: DC

## 2015-01-12 MED ORDER — AZITHROMYCIN 250 MG PO TABS: ORAL_TABLET | ORAL | Status: DC

## 2015-01-12 MED ORDER — GUAIFENESIN ER 1200 MG PO TB12: 1.0000 | ORAL_TABLET | Freq: Two times a day (BID) | ORAL | Status: DC | PRN

## 2015-01-12 NOTE — Patient Instructions (Signed)
Upper Respiratory Infection, Adult Most upper respiratory infections (URIs) are a viral infection of the air passages leading to the lungs. A URI affects the nose, throat, and upper air passages. The most common type of URI is nasopharyngitis and is typically referred to as "the common cold." URIs run their course and usually go away on their own. Most of the time, a URI does not require medical attention, but sometimes a bacterial infection in the upper airways can follow a viral infection. This is called a secondary infection. Sinus and middle ear infections are common types of secondary upper respiratory infections. Bacterial pneumonia can also complicate a URI. A URI can worsen asthma and chronic obstructive pulmonary disease (COPD). Sometimes, these complications can require emergency medical care and may be life threatening.  CAUSES Almost all URIs are caused by viruses. A virus is a type of germ and can spread from one person to another.  RISKS FACTORS You may be at risk for a URI if:   You smoke.   You have chronic heart or lung disease.  You have a weakened defense (immune) system.   You are very young or very old.   You have nasal allergies or asthma.  You work in crowded or poorly ventilated areas.  You work in health care facilities or schools. SIGNS AND SYMPTOMS  Symptoms typically develop 2-3 days after you come in contact with a cold virus. Most viral URIs last 7-10 days. However, viral URIs from the influenza virus (flu virus) can last 14-18 days and are typically more severe. Symptoms may include:   Runny or stuffy (congested) nose.   Sneezing.   Cough.   Sore throat.   Headache.   Fatigue.   Fever.   Loss of appetite.   Pain in your forehead, behind your eyes, and over your cheekbones (sinus pain).  Muscle aches.  DIAGNOSIS  Your health care provider may diagnose a URI by:  Physical exam.  Tests to check that your symptoms are not due to  another condition such as:  Strep throat.  Sinusitis.  Pneumonia.  Asthma. TREATMENT  A URI goes away on its own with time. It cannot be cured with medicines, but medicines may be prescribed or recommended to relieve symptoms. Medicines may help:  Reduce your fever.  Reduce your cough.  Relieve nasal congestion. HOME CARE INSTRUCTIONS   Take medicines only as directed by your health care provider.   Gargle warm saltwater or take cough drops to comfort your throat as directed by your health care provider.  Use a warm mist humidifier or inhale steam from a shower to increase air moisture. This may make it easier to breathe.  Drink enough fluid to keep your urine clear or pale yellow.   Eat soups and other clear broths and maintain good nutrition.   Rest as needed.   Return to work when your temperature has returned to normal or as your health care provider advises. You may need to stay home longer to avoid infecting others. You can also use a face mask and careful hand washing to prevent spread of the virus.  Increase the usage of your inhaler if you have asthma.   Do not use any tobacco products, including cigarettes, chewing tobacco, or electronic cigarettes. If you need help quitting, ask your health care provider. PREVENTION  The best way to protect yourself from getting a cold is to practice good hygiene.   Avoid oral or hand contact with people with cold   symptoms.   Wash your hands often if contact occurs.  There is no clear evidence that vitamin C, vitamin E, echinacea, or exercise reduces the chance of developing a cold. However, it is always recommended to get plenty of rest, exercise, and practice good nutrition.  SEEK MEDICAL CARE IF:   You are getting worse rather than better.   Your symptoms are not controlled by medicine.   You have chills.  You have worsening shortness of breath.  You have brown or red mucus.  You have yellow or brown nasal  discharge.  You have pain in your face, especially when you bend forward.  You have a fever.  You have swollen neck glands.  You have pain while swallowing.  You have white areas in the back of your throat. SEEK IMMEDIATE MEDICAL CARE IF:   You have severe or persistent:  Headache.  Ear pain.  Sinus pain.  Chest pain.  You have chronic lung disease and any of the following:  Wheezing.  Prolonged cough.  Coughing up blood.  A change in your usual mucus.  You have a stiff neck.  You have changes in your:  Vision.  Hearing.  Thinking.  Mood. MAKE SURE YOU:   Understand these instructions.  Will watch your condition.  Will get help right away if you are not doing well or get worse.   This information is not intended to replace advice given to you by your health care provider. Make sure you discuss any questions you have with your health care provider.   Document Released: 07/03/2000 Document Revised: 05/24/2014 Document Reviewed: 04/14/2013 Elsevier Interactive Patient Education 2016 Elsevier Inc.  

## 2015-01-12 NOTE — Progress Notes (Signed)
[facility] [address] [place] [address] [phone]  Date:  01/12/2015   Name:  Kendra Nguyen   DOB:  1980-11-15   MRN:  [id number]  PCP:  Ellsworth Lennox, MD    History of Present Illness:  Kendra Nguyen is a 34 y.o. female patient who presents to [facility] for cc of cough and nasal congestion for 1 week.   Cough is worse at night.  She has sinus congestion and has post nasal drip.  This is a dark yellow.  She has sore throat.  No sob or dyspnea.  Feels fatigued.  No fever.  She is taking nothing for relief.   No known sick contacts.     Patient Active Problem List   Diagnosis Date Noted  . S/P gastric bypass 12/18/2014  . 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 Pure hypercholesterolemia 12/15/2013  . Vitamin D deficiency 12/15/2013  . Morbid obesity ([place]) 03/21/2011    Past Medical History  Diagnosis Date  . Hypertension   . Hx of laparoscopic gastric banding   . Varicose veins   . Hyperlipemia   . Morbid obesity with BMI of 45.0-49.9, adult [facility])     Past Surgical History  Procedure Laterality Date  . Ankle fracture surgery    . Laparoscopic gastric banding  04/17/2011  . Laparoscopic gastric sleeve resection      2016  . Colonoscopy with propofol N/A 11/24/2014    Procedure: COLONOSCOPY WITH PROPOFOL;  Surgeon: Milus Banister, MD;  Location: [facility];  Service: Endoscopy;  Laterality: N/A;    Social History  Substance Use Topics  . Smoking status: Never Smoker   . Smokeless tobacco: Never Used  . Alcohol Use: No    Family History  Problem Relation Age of Onset  . Diabetes Mother   . Heart disease Mother   . Arthritis Father     gout  . COPD Father     smoker  . Hyperlipidemia Father   . Cancer Father   . Aneurysm Sister 34    cerebral; some memory loss  . COPD Paternal 38   . Neuropathy Neg Hx     Allergies  Allergen Reactions  . Tape     rash  . Neomycin Rash    Medication  list has been reviewed and updated.  Current Outpatient Prescriptions on File Prior to Visit  Medication Sig Dispense Refill  . simvastatin (ZOCOR) 20 MG tablet Take 1 tablet (20 mg total) by mouth daily. 90 tablet 1   No current facility-administered medications on file prior to visit.    ROS ROS otherwise unremarkable unless listed above.   Physical Examination: BP 122/76 mmHg  Pulse 79  Temp(Src) 97.4 F (36.3 C) (Oral)  Resp 16  Ht 5\' 6"  (1.676 m)  Wt 295 lb (133.811 kg)  BMI 47.64 kg/m2  SpO2 99%  LMP 01/10/2015 Ideal Body Weight: Weight in (lb) to have BMI = 25: 154.6  Physical Exam  Constitutional: She is oriented to person, place, and time. She appears well-developed and well-nourished. No distress.  HENT:  Head: Normocephalic and atraumatic.  Right Ear: Tympanic membrane, external ear and ear canal normal.  Left Ear: Tympanic membrane, external ear and ear canal normal.  Nose: Mucosal edema and rhinorrhea present. Right sinus exhibits no maxillary sinus tenderness and no frontal sinus tenderness. Left sinus exhibits no maxillary sinus tenderness and no frontal sinus tenderness.  Mouth/Throat: No uvula swelling. No oropharyngeal  exudate, posterior oropharyngeal edema or posterior oropharyngeal erythema.  Eyes: Conjunctivae and EOM are normal. Pupils are equal, round, and reactive to light.  Cardiovascular: Normal rate and regular rhythm.  Exam reveals no gallop, no distant heart sounds and no friction rub.   No murmur heard. Pulmonary/Chest: Effort normal. No respiratory distress. She has no decreased breath sounds. She has no wheezes. She has no rhonchi.  Lymphadenopathy:       Head (right side): No submandibular, no tonsillar, no preauricular and no posterior auricular adenopathy present.       Head (left side): No submandibular, no tonsillar, no preauricular and no posterior auricular adenopathy present.  Neurological: She is alert and oriented to person, place, and  time.  Skin: She is not diaphoretic.  Psychiatric: She has a normal mood and affect. Her behavior is normal.       Assessment and Plan: ZEMIRAH STANICH is a 34 y.o. female who is here today for cc of cough, and nasal congestion. Treating supportively at this time Given azithromycin to take if sxs continue in 2 days.   Acute upper respiratory infection - Plan: Guaifenesin (MUCINEX MAXIMUM STRENGTH) 1200 MG TB12, ipratropium (ATROVENT) 0.03 % nasal spray, azithromycin (ZITHROMAX) 250 MG tablet    Ivar Drape, PA-C [facility] 01/12/2015 1:02 PM

## 2015-02-16 ENCOUNTER — Other Ambulatory Visit: Payer: Self-pay | Admitting: Physician Assistant

## 2015-06-13 LAB — OB RESULTS CONSOLE HEPATITIS B SURFACE ANTIGEN: HEP B S AG: NEGATIVE

## 2015-06-13 LAB — OB RESULTS CONSOLE GC/CHLAMYDIA
Chlamydia: NEGATIVE
Gonorrhea: NEGATIVE

## 2015-06-13 LAB — OB RESULTS CONSOLE RPR: RPR: NONREACTIVE

## 2015-06-13 LAB — OB RESULTS CONSOLE RUBELLA ANTIBODY, IGM: Rubella: IMMUNE

## 2015-06-13 LAB — OB RESULTS CONSOLE ABO/RH: RH TYPE: POSITIVE

## 2015-06-13 LAB — OB RESULTS CONSOLE ANTIBODY SCREEN: Antibody Screen: NEGATIVE

## 2015-06-13 LAB — OB RESULTS CONSOLE HIV ANTIBODY (ROUTINE TESTING): HIV: NONREACTIVE

## 2015-10-04 ENCOUNTER — Encounter: Payer: Self-pay | Admitting: Physician Assistant

## 2015-10-04 DIAGNOSIS — O4400 Placenta previa specified as without hemorrhage, unspecified trimester: Secondary | ICD-10-CM | POA: Insufficient documentation

## 2016-01-03 ENCOUNTER — Other Ambulatory Visit: Payer: Self-pay | Admitting: Obstetrics and Gynecology

## 2016-01-04 LAB — OB RESULTS CONSOLE GBS: GBS: NEGATIVE

## 2016-01-10 ENCOUNTER — Encounter (HOSPITAL_COMMUNITY): Payer: Self-pay | Admitting: *Deleted

## 2016-01-17 ENCOUNTER — Encounter (HOSPITAL_COMMUNITY): Payer: Self-pay

## 2016-01-17 ENCOUNTER — Inpatient Hospital Stay (HOSPITAL_COMMUNITY)
Admission: AD | Admit: 2016-01-17 | Discharge: 2016-01-21 | DRG: 765 | Disposition: A | Discharge status: Home / Self Care | Payer: BC Managed Care – PPO | Source: Ambulatory Visit | Attending: Obstetrics and Gynecology | Admitting: Obstetrics and Gynecology

## 2016-01-17 DIAGNOSIS — Z6841 Body Mass Index (BMI) 40.0 and over, adult: Secondary | ICD-10-CM | POA: Diagnosis not present

## 2016-01-17 DIAGNOSIS — Z3A38 38 weeks gestation of pregnancy: Secondary | ICD-10-CM | POA: Diagnosis not present

## 2016-01-17 DIAGNOSIS — O114 Pre-existing hypertension with pre-eclampsia, complicating childbirth: Secondary | ICD-10-CM | POA: Diagnosis present

## 2016-01-17 DIAGNOSIS — O1002 Pre-existing essential hypertension complicating childbirth: Secondary | ICD-10-CM | POA: Diagnosis present

## 2016-01-17 DIAGNOSIS — O99844 Bariatric surgery status complicating childbirth: Secondary | ICD-10-CM | POA: Diagnosis present

## 2016-01-17 DIAGNOSIS — O1494 Unspecified pre-eclampsia, complicating childbirth: Secondary | ICD-10-CM | POA: Diagnosis present

## 2016-01-17 DIAGNOSIS — O99214 Obesity complicating childbirth: Secondary | ICD-10-CM | POA: Diagnosis present

## 2016-01-17 DIAGNOSIS — O9902 Anemia complicating childbirth: Secondary | ICD-10-CM | POA: Diagnosis present

## 2016-01-17 DIAGNOSIS — O1490 Unspecified pre-eclampsia, unspecified trimester: Secondary | ICD-10-CM | POA: Diagnosis present

## 2016-01-17 DIAGNOSIS — O1493 Unspecified pre-eclampsia, third trimester: Secondary | ICD-10-CM

## 2016-01-17 DIAGNOSIS — D573 Sickle-cell trait: Secondary | ICD-10-CM | POA: Diagnosis present

## 2016-01-17 DIAGNOSIS — Z9884 Bariatric surgery status: Secondary | ICD-10-CM

## 2016-01-17 LAB — URINALYSIS, ROUTINE W REFLEX MICROSCOPIC
BILIRUBIN URINE: NEGATIVE
Glucose, UA: NEGATIVE mg/dL
KETONES UR: NEGATIVE mg/dL
LEUKOCYTES UA: NEGATIVE
Nitrite: NEGATIVE
Protein, ur: 100 mg/dL — AB
SPECIFIC GRAVITY, URINE: 1.012 (ref 1.005–1.030)
pH: 5 (ref 5.0–8.0)

## 2016-01-17 LAB — TYPE AND SCREEN
ABO/RH(D): AB POS
ANTIBODY SCREEN: NEGATIVE

## 2016-01-17 LAB — CBC
HCT: 31.7 % — ABNORMAL LOW (ref 36.0–46.0)
Hemoglobin: 11.1 g/dL — ABNORMAL LOW (ref 12.0–15.0)
MCH: 29.6 pg (ref 26.0–34.0)
MCHC: 35 g/dL (ref 30.0–36.0)
MCV: 84.5 fL (ref 78.0–100.0)
PLATELETS: 227 10*3/uL (ref 150–400)
RBC: 3.75 MIL/uL — AB (ref 3.87–5.11)
RDW: 15 % (ref 11.5–15.5)
WBC: 9 10*3/uL (ref 4.0–10.5)

## 2016-01-17 LAB — COMPREHENSIVE METABOLIC PANEL
ALT: 41 U/L (ref 14–54)
ANION GAP: 6 (ref 5–15)
AST: 44 U/L — ABNORMAL HIGH (ref 15–41)
Albumin: 2.9 g/dL — ABNORMAL LOW (ref 3.5–5.0)
Alkaline Phosphatase: 119 U/L (ref 38–126)
BUN: 6 mg/dL (ref 6–20)
CHLORIDE: 108 mmol/L (ref 101–111)
CO2: 22 mmol/L (ref 22–32)
Calcium: 8.5 mg/dL — ABNORMAL LOW (ref 8.9–10.3)
Creatinine, Ser: 0.61 mg/dL (ref 0.44–1.00)
GFR calc non Af Amer: >60 mL/min (ref >60)
Glucose, Bld: 102 mg/dL — ABNORMAL HIGH (ref 65–99)
POTASSIUM: 3.8 mmol/L (ref 3.5–5.1)
SODIUM: 136 mmol/L (ref 135–145)
Total Bilirubin: 1.1 mg/dL (ref 0.3–1.2)
Total Protein: 6 g/dL — ABNORMAL LOW (ref 6.5–8.1)

## 2016-01-17 LAB — PROTEIN / CREATININE RATIO, URINE
CREATININE, URINE: 144 mg/dL
Protein Creatinine Ratio: 0.33 mg/mg{Cre} — ABNORMAL HIGH (ref 0.00–0.15)
TOTAL PROTEIN, URINE: 47 mg/dL

## 2016-01-17 LAB — ABO/RH: ABO/RH(D): AB POS

## 2016-01-17 MED ORDER — OXYCODONE-ACETAMINOPHEN 5-325 MG PO TABS: 2.0000 | ORAL_TABLET | ORAL | Status: DC | PRN

## 2016-01-17 MED ORDER — TERBUTALINE SULFATE 1 MG/ML IJ SOLN
0.2500 mg | Freq: Once | INTRAMUSCULAR | Status: DC | PRN
  Filled 2016-01-17: qty 1

## 2016-01-17 MED ORDER — SOD CITRATE-CITRIC ACID 500-334 MG/5ML PO SOLN: 30.0000 mL | ORAL | Status: DC | PRN

## 2016-01-17 MED ORDER — FLEET ENEMA 7-19 GM/118ML RE ENEM: 1.0000 | ENEMA | RECTAL | Status: DC | PRN

## 2016-01-17 MED ORDER — OXYTOCIN BOLUS FROM INFUSION: 500.0000 mL | Freq: Once | INTRAVENOUS | Status: DC

## 2016-01-17 MED ORDER — ACETAMINOPHEN 325 MG PO TABS: 650.0000 mg | ORAL_TABLET | ORAL | Status: DC | PRN

## 2016-01-17 MED ORDER — OXYCODONE-ACETAMINOPHEN 5-325 MG PO TABS: 1.0000 | ORAL_TABLET | ORAL | Status: DC | PRN

## 2016-01-17 MED ORDER — LACTATED RINGERS IV SOLN: 500.0000 mL | INTRAVENOUS | Status: DC | PRN

## 2016-01-17 MED ORDER — LACTATED RINGERS IV SOLN
500.0000 mL | INTRAVENOUS | Status: DC | PRN
  Administered 2016-01-18 (×2): 500 mL via INTRAVENOUS

## 2016-01-17 MED ORDER — MISOPROSTOL 25 MCG QUARTER TABLET
25.0000 ug | ORAL_TABLET | ORAL | Status: DC | PRN
  Administered 2016-01-17 (×2): 25 ug via VAGINAL
  Filled 2016-01-17 (×2): qty 0.25

## 2016-01-17 MED ORDER — ACETAMINOPHEN 325 MG PO TABS
650.0000 mg | ORAL_TABLET | Freq: Once | ORAL | Status: AC
  Administered 2016-01-17: 650 mg via ORAL
  Filled 2016-01-17: qty 2

## 2016-01-17 MED ORDER — LACTATED RINGERS IV SOLN
INTRAVENOUS | Status: DC
  Administered 2016-01-18 (×2): via INTRAVENOUS

## 2016-01-17 MED ORDER — LIDOCAINE HCL (PF) 1 % IJ SOLN: 30.0000 mL | INTRAMUSCULAR | Status: DC | PRN

## 2016-01-17 MED ORDER — LACTATED RINGERS IV SOLN
INTRAVENOUS | Status: DC
  Administered 2016-01-17: 17:00:00 via INTRAVENOUS

## 2016-01-17 MED ORDER — ONDANSETRON HCL 4 MG/2ML IJ SOLN: 4.0000 mg | Freq: Four times a day (QID) | INTRAMUSCULAR | Status: DC | PRN

## 2016-01-17 MED ORDER — OXYTOCIN 40 UNITS IN LACTATED RINGERS INFUSION - SIMPLE MED: 2.5000 [IU]/h | INTRAVENOUS | Status: DC

## 2016-01-17 MED ORDER — SOD CITRATE-CITRIC ACID 500-334 MG/5ML PO SOLN
30.0000 mL | ORAL | Status: DC | PRN
  Filled 2016-01-17: qty 15

## 2016-01-17 MED ORDER — OXYTOCIN 40 UNITS IN LACTATED RINGERS INFUSION - SIMPLE MED
2.5000 [IU]/h | INTRAVENOUS | Status: DC
  Filled 2016-01-17: qty 1000

## 2016-01-17 NOTE — Progress Notes (Addendum)
Kendra Nguyen MRN: [id number]  Subjective: -Care assumed of 35 y.o. G1P0 at [redacted]w[redacted]d who presents for IOL secondary to PreEclampsia. Pregnancy and Medical History also signfiicant for AMA, Sickle Cell Trait, CHTN with resolution s/p Gastric Bypass 4 years ago, and BMI of 51.3. In room to meet acquaintance of patient and family and discuss POC for tonight.  Patient denies HA, visual disturbances, epigastric pain, SoB, and numbness/tingling.  However, patient does report some nausea, but declines antiemetic.  Patient also reports perception of contractions and fetal movement.    Objective: BP (!) 142/81 (BP Location: Left Arm)   Pulse 87   Temp 98.1 F (36.7 C) (Oral)   Resp 18   Ht 5\' 6"  (1.676 m)   Wt (!) 147.9 kg (326 lb)   BMI 52.62 kg/m  No intake/output data recorded. No intake/output data recorded.   Results for orders placed or performed during the hospital encounter of 01/17/16 (from the past 24 hour(s))  Urinalysis, Routine w reflex microscopic     Status: Abnormal   Collection Time: 01/17/16  2:00 PM  Result Value Ref Range   Color, Urine YELLOW YELLOW   APPearance HAZY (A) CLEAR   Specific Gravity, Urine 1.012 1.005 - 1.030   pH 5.0 5.0 - 8.0   Glucose, UA NEGATIVE NEGATIVE mg/dL   Hgb urine dipstick SMALL (A) NEGATIVE   Bilirubin Urine NEGATIVE NEGATIVE   Ketones, ur NEGATIVE NEGATIVE mg/dL   Protein, ur 100 (A) NEGATIVE mg/dL   Nitrite NEGATIVE NEGATIVE   Leukocytes, UA NEGATIVE NEGATIVE   RBC / HPF 0-5 0 - 5 RBC/hpf   WBC, UA 0-5 0 - 5 WBC/hpf   Bacteria, UA RARE (A) NONE SEEN   Squamous Epithelial / LPF 0-5 (A) NONE SEEN   Mucous PRESENT   Protein / creatinine ratio, urine     Status: Abnormal   Collection Time: 01/17/16  2:00 PM  Result Value Ref Range   Creatinine, Urine 144.00 mg/dL   Total Protein, Urine 47 mg/dL   Protein Creatinine Ratio 0.33 (H) 0.00 - 0.15 mg/mg[Cre]  CBC     Status: Abnormal   Collection Time: 01/17/16  2:48 PM  Result Value Ref  Range   WBC 9.0 4.0 - 10.5 K/uL   RBC 3.75 (L) 3.87 - 5.11 MIL/uL   Hemoglobin 11.1 (L) 12.0 - 15.0 g/dL   HCT 31.7 (L) 36.0 - 46.0 %   MCV 84.5 78.0 - 100.0 fL   MCH 29.6 26.0 - 34.0 pg   MCHC 35.0 30.0 - 36.0 g/dL   RDW 15.0 11.5 - 15.5 %   Platelets 227 150 - 400 K/uL  Comprehensive metabolic panel     Status: Abnormal   Collection Time: 01/17/16  2:48 PM  Result Value Ref Range   Sodium 136 135 - 145 mmol/L   Potassium 3.8 3.5 - 5.1 mmol/L   Chloride 108 101 - 111 mmol/L   CO2 22 22 - 32 mmol/L   Glucose, Bld 102 (H) 65 - 99 mg/dL   BUN 6 6 - 20 mg/dL   Creatinine, Ser 0.61 0.44 - 1.00 mg/dL   Calcium 8.5 (L) 8.9 - 10.3 mg/dL   Total Protein 6.0 (L) 6.5 - 8.1 g/dL   Albumin 2.9 (L) 3.5 - 5.0 g/dL   AST 44 (H) 15 - 41 U/L   ALT 41 14 - 54 U/L   Alkaline Phosphatase 119 38 - 126 U/L   Total Bilirubin 1.1 0.3 - 1.2 mg/dL  GFR calc non Af Amer >60 >60 mL/min   GFR calc Af Amer >60 >60 mL/min   Anion gap 6 5 - 15  Type and screen [place]     Status: None   Collection Time: 01/17/16  2:48 PM  Result Value Ref Range   ABO/RH(D) AB POS    Antibody Screen NEG    Sample Expiration 01/20/2016     Fetal Monitoring: FHT: 135 bpm, Mod Var, -Decels, +Accels UC: Occasional mild graphed    Physical Exam General: AO Chest: HRRR, LCTA Abdomen: BS x 4Q, Fundus soft RT Extremities: +3 Pitting Edema in BLE Skin: Warm, Dry  Vaginal Exam: SVE:   Dilation: Fingertip Effacement (%): Thick Exam by:: erin foley,rn at [facility] Internal Monitors: None  Augmentation/Induction: Pitocin:None Cytotec: 1st Dose at 1830  Assessment:  IUP at 38.4wks Cat I FT  PreEclampsia Morbid Obesity AMA S/P Gastric Bypass  Plan: -Discussed POC to include possible foley bulb placement at next check or cytotec throughout the night -R/B of foley bulb discussed including pain and discomfort, increased risk for infection, and decreased induction time -Q/C  addreassed -Will reassess at 2230 -Continue other mgmt as ordered  Riley Churches, CNM 01/17/2016, 7:39 PM

## 2016-01-17 NOTE — Anesthesia Pain Management Evaluation Note (Signed)
  CRNA Pain Management Visit Note  Patient: Kendra Nguyen, 35 y.o., female  "Hello I am a member of the anesthesia team at [facility]. We have an anesthesia team available at all times to provide care throughout the hospital, including epidural management and anesthesia for C-section. I don't know your plan for the delivery whether it a natural birth, water birth, IV sedation, nitrous supplementation, doula or epidural, but we want to meet your pain goals."   1.Was your pain managed to your expectations on prior hospitalizations?   No prior hospitalizations  2.What is your expectation for pain management during this hospitalization?     Epidural  3.How can we help you reach that goal?   Record the patient's initial score and the patient's pain goal.   Pain: 0  Pain Goal: 6 [facility] wants you to be able to say your pain was always managed very well.  Jabier Mutton 01/17/2016

## 2016-01-17 NOTE — MAU Note (Signed)
Patient reports headache is better 2/10, notified Kendra Guild NP

## 2016-01-17 NOTE — Progress Notes (Signed)
ALEXZA KINA MRN: [id number]  Subjective: -Patient reports decrease in contraction perception.   Objective: BP 139/87   Pulse 85   Temp 98.6 F (37 C)   Resp 18   Ht 5\' 6"  (1.676 m)   Wt (!) 147.9 kg (326 lb)   BMI 52.62 kg/m  No intake/output data recorded. No intake/output data recorded.  Fetal Monitoring: FHT: 145 bpm, Mod Var, -Decels, +Accels UC: None graphed    Vaginal Exam: SVE:   Dilation: 1 Effacement (%): Thick Exam by:: Janett Billow Quasean Frye,cnm Membranes:Intact Internal Monitors: None  Augmentation/Induction: Pitocin:None Cytotec: 2nd Dose Placed  Assessment:  IUP at 38.4wks Cat I FT  IOL Bishop Score:2  Plan: -Foley bulb deferred d/t cervical position -Cytotec placed without difficulty -Nurse to give 0230 dose of cytotec -Will reassess for foley bulb and/or pitocin at 0630 -Continue other mgmt as ordered   Riley Churches, CNM 01/17/2016, 10:22 PM

## 2016-01-17 NOTE — MAU Note (Signed)
Patient presents with elevated blood pressure from the office, denies headache, no blurred vision, fetus now head down turned today, having increased swelling in legs and feet, hands as well.

## 2016-01-17 NOTE — MAU Provider Note (Signed)
History     CSN: [id number]  Arrival date and time: 01/17/16 1339   First Provider Initiated Contact with Patient 01/17/16 1440      Chief Complaint  Patient presents with  . Hypertension   HPI Kendra Nguyen is a 35 y.o. G1P0 at [redacted]w[redacted]d who presents from the office for BP evaluation. Pt in office today for ROB & BPP. Had elevated BP & was sent here for evaluation. PMH significant for chronic htn that resolved after gastric bypass 4 years ago. Denies hypertension in 3+ years. Headache that started after arriving to MAU; currently rates 4/10. Nothing makes better or worse. Has not treated. Denies vision changes, chest pain, n/v, epigastric pain. Positive fetal movement.  Initially was scheduled for c/s d/t transverse lie of fetus. Per patient; vertex confirmed by BPP ultrasound today in the office.   OB History    Gravida Para Term Preterm AB Living   1             SAB TAB Ectopic Multiple Live Births                  Past Medical History:  Diagnosis Date  . Hx of laparoscopic gastric banding   . Hyperlipemia   . Hypertension   . Morbid obesity with BMI of 45.0-49.9, adult ([place])   . Varicose veins     Past Surgical History:  Procedure Laterality Date  . COLONOSCOPY WITH PROPOFOL N/A 11/24/2014   Procedure: COLONOSCOPY WITH PROPOFOL;  Surgeon: Milus Banister, MD;  Location: [facility];  Service: Endoscopy;  Laterality: N/A;  . LAPAROSCOPIC GASTRIC BANDING  04/17/2011  . [place] RESECTION     2016  . WISDOM TOOTH EXTRACTION      Family History  Problem Relation Age of Onset  . Diabetes Mother   . Heart disease Mother   . Arthritis Father     gout  . COPD Father     smoker  . Hyperlipidemia Father   . Cancer Father   . Aneurysm Sister 34    cerebral;   . COPD Paternal 84   . Neuropathy Neg Hx     Social History  Substance Use Topics  . Smoking status: Never Smoker  . Smokeless tobacco: Never Used  . Alcohol use No    Allergies:   Allergies  Allergen Reactions  . Tape     rash  . Neomycin Rash    Prescriptions Prior to Admission  Medication Sig Dispense Refill Last Dose  . Prenatal MV-Min-FA-Omega-3 (PRENATAL GUMMIES/DHA & FA) 0.4-32.5 MG CHEW Chew 2 tablets by mouth daily.   01/17/2016 at Unknown time  . acetaminophen (TYLENOL) 500 MG tablet Take 500 mg by mouth every 6 (six) hours as needed for moderate pain.   01/15/2016  . Guaifenesin (MUCINEX MAXIMUM STRENGTH) 1200 MG TB12 Take 1 tablet (1,200 mg total) by mouth every 12 (twelve) hours as needed. (Patient not taking: Reported on 01/09/2016) 14 tablet 1 Completed Course at Unknown time  . ipratropium (ATROVENT) 0.03 % nasal spray PLACE 2 SPRAYS INTO BOTH NOSTRILS 2 (TWO) TIMES DAILY. (Patient not taking: Reported on 01/17/2016) 30 mL 0 Not Taking at Unknown time  . simvastatin (ZOCOR) 20 MG tablet Take 1 tablet (20 mg total) by mouth daily. (Patient not taking: Reported on 01/09/2016) 90 tablet 1 Not Taking at Unknown time    Review of Systems  Constitutional: Negative.   Eyes: Negative for blurred vision.  Respiratory: Negative for shortness of  breath.   Cardiovascular: Negative for chest pain.  Gastrointestinal: Negative.   Genitourinary: Negative.   Neurological: Positive for headaches. Negative for dizziness.   Physical Exam   Blood pressure (!) 151/101, pulse 107, height 5\' 6"  (1.676 m), weight (!) 326 lb (147.9 kg).  Pulse Rate:  [75-117] 79 (12/27 1546) BP: (138-152)/(94-107) 138/100 (12/27 1546) Weight:  [326 lb (147.9 kg)] 326 lb (147.9 kg) (12/27 1358)  Physical Exam  Nursing note and vitals reviewed. Constitutional: She is oriented to person, place, and time. She appears well-developed and well-nourished. No distress.  HENT:  Head: Normocephalic and atraumatic.  Eyes: Conjunctivae are normal. Right eye exhibits no discharge. Left eye exhibits no discharge. No scleral icterus.  Neck: Normal range of motion.  Cardiovascular: Normal rate,  regular rhythm and normal heart sounds.   No murmur heard. Respiratory: Effort normal and breath sounds normal. No respiratory distress. She has no wheezes.  GI: Soft. There is no tenderness.  Musculoskeletal: She exhibits edema (BLE, 3+ pitting).  Neurological: She is alert and oriented to person, place, and time. She has normal reflexes.  No clonus  Skin: Skin is warm and dry. She is not diaphoretic.  Psychiatric: She has a normal mood and affect. Her behavior is normal. Judgment and thought content normal.   Fetal Tracing:  Baseline: 140 Variability: moderate Accelerations: 15x15 Decelerations: none  Toco: irr ctx   MAU Course  Procedures Results for orders placed or performed during the hospital encounter of 01/17/16 (from the past 24 hour(s))  Urinalysis, Routine w reflex microscopic     Status: Abnormal   Collection Time: 01/17/16  2:00 PM  Result Value Ref Range   Color, Urine YELLOW YELLOW   APPearance HAZY (A) CLEAR   Specific Gravity, Urine 1.012 1.005 - 1.030   pH 5.0 5.0 - 8.0   Glucose, UA NEGATIVE NEGATIVE mg/dL   Hgb urine dipstick SMALL (A) NEGATIVE   Bilirubin Urine NEGATIVE NEGATIVE   Ketones, ur NEGATIVE NEGATIVE mg/dL   Protein, ur 100 (A) NEGATIVE mg/dL   Nitrite NEGATIVE NEGATIVE   Leukocytes, UA NEGATIVE NEGATIVE   RBC / HPF 0-5 0 - 5 RBC/hpf   WBC, UA 0-5 0 - 5 WBC/hpf   Bacteria, UA RARE (A) NONE SEEN   Squamous Epithelial / LPF 0-5 (A) NONE SEEN   Mucous PRESENT   Protein / creatinine ratio, urine     Status: Abnormal   Collection Time: 01/17/16  2:00 PM  Result Value Ref Range   Creatinine, Urine 144.00 mg/dL   Total Protein, Urine 47 mg/dL   Protein Creatinine Ratio 0.33 (H) 0.00 - 0.15 mg/mg[Cre]  CBC     Status: Abnormal   Collection Time: 01/17/16  2:48 PM  Result Value Ref Range   WBC 9.0 4.0 - 10.5 K/uL   RBC 3.75 (L) 3.87 - 5.11 MIL/uL   Hemoglobin 11.1 (L) 12.0 - 15.0 g/dL   HCT 31.7 (L) 36.0 - 46.0 %   MCV 84.5 78.0 - 100.0 fL    MCH 29.6 26.0 - 34.0 pg   MCHC 35.0 30.0 - 36.0 g/dL   RDW 15.0 11.5 - 15.5 %   Platelets 227 150 - 400 K/uL  Comprehensive metabolic panel     Status: Abnormal   Collection Time: 01/17/16  2:48 PM  Result Value Ref Range   Sodium 136 135 - 145 mmol/L   Potassium 3.8 3.5 - 5.1 mmol/L   Chloride 108 101 - 111 mmol/L   CO2 22  22 - 32 mmol/L   Glucose, Bld 102 (H) 65 - 99 mg/dL   BUN 6 6 - 20 mg/dL   Creatinine, Ser 0.61 0.44 - 1.00 mg/dL   Calcium 8.5 (L) 8.9 - 10.3 mg/dL   Total Protein 6.0 (L) 6.5 - 8.1 g/dL   Albumin 2.9 (L) 3.5 - 5.0 g/dL   AST 44 (H) 15 - 41 U/L   ALT 41 14 - 54 U/L   Alkaline Phosphatase 119 38 - 126 U/L   Total Bilirubin 1.1 0.3 - 1.2 mg/dL   GFR calc non Af Amer >60 >60 mL/min   GFR calc Af Amer >60 >60 mL/min   Anion gap 6 5 - 15    MDM CBC, CMP, urine PCR BPs elevated; none severe range Tylenol 650 mg PO -- pt reports improvement in symptoms Category 1 tracing S/w Dr. Charlesetta Garibaldi regarding BPs & labs. Will admit for IOL. Confirmed with patient that vertex presentation was confirmed by ultrasound today in the office.   Assessment and Plan  A; 1. Pre-eclampsia in third trimester    P: Admit to birthing suites for [place] 01/17/2016, 2:40 PM

## 2016-01-17 NOTE — H&P (Signed)
Kendra Nguyen is a 35 y.o. female, G1P0 at 38.4 weeks, presenting for headache and increased Bp.  Pt was sent over from office for Pre eclampsia evaluation.  Pt states headache better, Fm+. Prenatal hx remarkable for AMA, Sickle cell trait, S/P gastric bypass.    Patient Active Problem List   Diagnosis Date Noted  . Pre-eclampsia affecting pregnancy, antepartum 01/17/2016  . Preeclampsia, third trimester 01/17/2016  . Placenta previa 10/04/2015  . S/P gastric bypass 12/18/2014  . 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 Pure hypercholesterolemia 12/15/2013  . Vitamin D deficiency 12/15/2013  . Morbid obesity ([place]) 03/21/2011    History of present pregnancy: Patient entered care at 7.3 weeks.   EDC of 01/27/2016 was established by LMP.   Anatomy scan:  20 weeks, with normal findings and an anterior low lying placenta.   Additional [place] evaluations: Low lying placenta resolved at 23.4.  Monthly growth scans and antenatal testing from 32 weeks due to increased BMI Significant prenatal events: Increased BP today  Last evaluation:  01/03/2017 US showed AFI wnl, last EFW >86 percentile 7-7  OB History    Gravida Para Term Preterm AB Living   1             SAB TAB Ectopic Multiple Live Births                 Past Medical History:  Diagnosis Date  . Hx of laparoscopic gastric banding   . Hyperlipemia   . Hypertension   . Morbid obesity with BMI of 45.0-49.9, adult ([place])   . Varicose veins    Past Surgical History:  Procedure Laterality Date  . COLONOSCOPY WITH PROPOFOL N/A 11/24/2014   Procedure: COLONOSCOPY WITH PROPOFOL;  Surgeon: Milus Banister, MD;  [facility]: [facility];  Service: Endoscopy;  Laterality: N/A;  . LAPAROSCOPIC GASTRIC BANDING  04/17/2011  . [place] RESECTION     2016  . WISDOM TOOTH EXTRACTION     Family History: family history includes Aneurysm (age of onset: 65) in her sister; Arthritis in her father; COPD in her  father and paternal aunt; Cancer in her father; Diabetes in her mother; Heart disease in her mother; Hyperlipidemia in her father. Social History:  reports that she has never smoked. She has never used smokeless tobacco. She reports that she does not drink alcohol or use drugs.   Prenatal Transfer Tool  Maternal Diabetes: No Genetic Screening: Declined Maternal Ultrasounds/Referrals: Normal Fetal Ultrasounds or other Referrals:  None Maternal Substance Abuse:  No Significant Maternal Medications:  None Significant Maternal Lab Results: None  TDAP Yes Flu Yes  ROS:  All 10 systems reviewed and negative except as what stated above  Allergies  Allergen Reactions  . Tape     rash  . Neomycin Rash     Exam by:: Irene Shipper, CNM Blood pressure (!) 145/79, pulse 88, temperature 98.3 F (36.8 C), temperature source Oral, resp. rate 17, height 5\' 6"  (1.676 m), weight (!) 147.9 kg (326 lb).  Chest clear Heart RRR without murmur Abd gravid, NT, FH appropriate Pelvic: Per Dr. Mancel Bale Ext: +2/+2 reflexes, negative edema  FHR: Category 1 UCs:  None  Prenatal labs: ABO, Rh: AB/Positive/-- (05/23 0000) Antibody: Negative (05/23 0000) Rubella:  Immune RPR: Nonreactive (05/23 0000)  HBsAg: Negative (05/23 0000)  HIV: Non-reactive (05/23 0000)  GBS: Negative (12/14 0000) Sickle cell/Hgb electrophoresis:  Sickle cell trait Pap:  Neg GC:  Neg Chlamydia:  Neg Genetic screenings:  Declined Glucola: 165 3 hour gtt wnl Other:   Hgb  at NOB, 13 at 10.5 28 weeks       Assessment/Plan: IUP at 38.4 IUP induction for Pre Eclampsia Cat 1 strip AMA Sickle cell trait Increased BMI 51.3  Plan: Admit to [place] per consult with Dr. Charlesetta Garibaldi Routine [facility] orders Pain med/epidural prn   Pleas Koch ProtheroCNM, MSN 01/17/2016, 6:17 PM

## 2016-01-18 ENCOUNTER — Encounter (HOSPITAL_COMMUNITY): Payer: Self-pay

## 2016-01-18 ENCOUNTER — Encounter (HOSPITAL_COMMUNITY)
Admission: AD | Disposition: A | Discharge status: Home / Self Care | Payer: Self-pay | Source: Ambulatory Visit | Attending: Obstetrics and Gynecology

## 2016-01-18 ENCOUNTER — Inpatient Hospital Stay (HOSPITAL_COMMUNITY): Payer: BC Managed Care – PPO | Admitting: Anesthesiology

## 2016-01-18 LAB — CBC
HEMATOCRIT: 28.5 % — AB (ref 36.0–46.0)
HEMATOCRIT: 29.5 % — AB (ref 36.0–46.0)
HEMOGLOBIN: 10.1 g/dL — AB (ref 12.0–15.0)
HEMOGLOBIN: 10.2 g/dL — AB (ref 12.0–15.0)
MCH: 29.6 pg (ref 26.0–34.0)
MCH: 29.1 pg (ref 26.0–34.0)
MCHC: 35.4 g/dL (ref 30.0–36.0)
MCHC: 34.6 g/dL (ref 30.0–36.0)
MCV: 83.6 fL (ref 78.0–100.0)
MCV: 84 fL (ref 78.0–100.0)
Platelets: 190 10*3/uL (ref 150–400)
Platelets: 197 10*3/uL (ref 150–400)
RBC: 3.41 MIL/uL — AB (ref 3.87–5.11)
RBC: 3.51 MIL/uL — AB (ref 3.87–5.11)
RDW: 15 % (ref 11.5–15.5)
RDW: 14.6 % (ref 11.5–15.5)
WBC: 10.3 10*3/uL (ref 4.0–10.5)
WBC: 14.8 10*3/uL — ABNORMAL HIGH (ref 4.0–10.5)

## 2016-01-18 SURGERY — Surgical Case
Anesthesia: Spinal

## 2016-01-18 MED ORDER — KETOROLAC TROMETHAMINE 30 MG/ML IJ SOLN: 30.0000 mg | Freq: Four times a day (QID) | INTRAMUSCULAR | Status: AC | PRN

## 2016-01-18 MED ORDER — PHENYLEPHRINE 8 MG IN D5W 100 ML (0.08MG/ML) PREMIX OPTIME
INJECTION | INTRAVENOUS | Status: DC | PRN
  Administered 2016-01-18: 40 ug/min via INTRAVENOUS

## 2016-01-18 MED ORDER — OXYCODONE HCL 5 MG PO TABS: 10.0000 mg | ORAL_TABLET | ORAL | Status: DC | PRN

## 2016-01-18 MED ORDER — DIPHENHYDRAMINE HCL 50 MG/ML IJ SOLN: 12.5000 mg | INTRAMUSCULAR | Status: DC | PRN

## 2016-01-18 MED ORDER — PRENATAL MULTIVITAMIN CH
1.0000 | ORAL_TABLET | Freq: Every day | ORAL | Status: DC
  Administered 2016-01-19 – 2016-01-21 (×3): 1 via ORAL
  Filled 2016-01-18 (×2): qty 1

## 2016-01-18 MED ORDER — SIMETHICONE 80 MG PO CHEW
80.0000 mg | CHEWABLE_TABLET | ORAL | Status: DC
  Administered 2016-01-20 – 2016-01-21 (×2): 80 mg via ORAL
  Filled 2016-01-18 (×2): qty 1

## 2016-01-18 MED ORDER — DIPHENHYDRAMINE HCL 25 MG PO CAPS: 25.0000 mg | ORAL_CAPSULE | Freq: Four times a day (QID) | ORAL | Status: DC | PRN

## 2016-01-18 MED ORDER — PHENYLEPHRINE 8 MG IN D5W 100 ML (0.08MG/ML) PREMIX OPTIME
INJECTION | INTRAVENOUS | Status: AC
  Filled 2016-01-18: qty 100

## 2016-01-18 MED ORDER — DEXAMETHASONE SODIUM PHOSPHATE 4 MG/ML IJ SOLN
INTRAMUSCULAR | Status: DC | PRN
  Administered 2016-01-18: 4 mg via INTRAVENOUS

## 2016-01-18 MED ORDER — FENTANYL CITRATE (PF) 100 MCG/2ML IJ SOLN
INTRAMUSCULAR | Status: AC
  Filled 2016-01-18: qty 2

## 2016-01-18 MED ORDER — NALBUPHINE HCL 10 MG/ML IJ SOLN
5.0000 mg | Freq: Once | INTRAMUSCULAR | Status: AC | PRN
  Administered 2016-01-19: 5 mg via INTRAVENOUS
  Filled 2016-01-18: qty 1

## 2016-01-18 MED ORDER — CEFAZOLIN SODIUM 10 G IJ SOLR
INTRAMUSCULAR | Status: DC | PRN
  Administered 2016-01-18: 3 g via INTRAVENOUS

## 2016-01-18 MED ORDER — SODIUM CHLORIDE 0.9% FLUSH: 3.0000 mL | INTRAVENOUS | Status: DC | PRN

## 2016-01-18 MED ORDER — SIMETHICONE 80 MG PO CHEW
80.0000 mg | CHEWABLE_TABLET | ORAL | Status: DC | PRN
  Filled 2016-01-18: qty 1

## 2016-01-18 MED ORDER — LACTATED RINGERS IV SOLN: INTRAVENOUS | Status: DC

## 2016-01-18 MED ORDER — NALOXONE HCL 0.4 MG/ML IJ SOLN: 0.4000 mg | INTRAMUSCULAR | Status: DC | PRN

## 2016-01-18 MED ORDER — MORPHINE SULFATE (PF) 0.5 MG/ML IJ SOLN
INTRAMUSCULAR | Status: DC | PRN
  Administered 2016-01-18: .1 mg via INTRAVENOUS

## 2016-01-18 MED ORDER — EPHEDRINE 5 MG/ML INJ: 10.0000 mg | INTRAVENOUS | Status: DC | PRN

## 2016-01-18 MED ORDER — BUPIVACAINE IN DEXTROSE 0.75-8.25 % IT SOLN
INTRATHECAL | Status: DC | PRN
Start: 2016-01-18 — End: 2016-01-18
  Administered 2016-01-18: 1.5 mL via INTRATHECAL

## 2016-01-18 MED ORDER — ACETAMINOPHEN 325 MG PO TABS
650.0000 mg | ORAL_TABLET | ORAL | Status: DC | PRN
  Administered 2016-01-19 (×2): 650 mg via ORAL
  Filled 2016-01-18 (×2): qty 2

## 2016-01-18 MED ORDER — MORPHINE SULFATE-NACL 0.5-0.9 MG/ML-% IV SOSY
PREFILLED_SYRINGE | INTRAVENOUS | Status: AC
  Filled 2016-01-18: qty 1

## 2016-01-18 MED ORDER — DIBUCAINE 1 % RE OINT: 1.0000 "application " | TOPICAL_OINTMENT | RECTAL | Status: DC | PRN

## 2016-01-18 MED ORDER — OXYTOCIN 40 UNITS IN LACTATED RINGERS INFUSION - SIMPLE MED
1.0000 m[IU]/min | INTRAVENOUS | Status: DC
  Administered 2016-01-18: 1 m[IU]/min via INTRAVENOUS
  Administered 2016-01-18: 12 m[IU]/min via INTRAVENOUS
  Administered 2016-01-18: 11 m[IU]/min via INTRAVENOUS

## 2016-01-18 MED ORDER — IBUPROFEN 600 MG PO TABS
600.0000 mg | ORAL_TABLET | Freq: Four times a day (QID) | ORAL | Status: DC
  Administered 2016-01-19 – 2016-01-21 (×11): 600 mg via ORAL
  Filled 2016-01-18 (×9): qty 1

## 2016-01-18 MED ORDER — SIMETHICONE 80 MG PO CHEW
80.0000 mg | CHEWABLE_TABLET | Freq: Three times a day (TID) | ORAL | Status: DC
  Administered 2016-01-19 – 2016-01-21 (×6): 80 mg via ORAL
  Filled 2016-01-18 (×6): qty 1

## 2016-01-18 MED ORDER — METOCLOPRAMIDE HCL 5 MG/ML IJ SOLN
INTRAMUSCULAR | Status: DC | PRN
  Administered 2016-01-18: 10 mg via INTRAVENOUS

## 2016-01-18 MED ORDER — SCOPOLAMINE 1 MG/3DAYS TD PT72: 1.0000 | MEDICATED_PATCH | Freq: Once | TRANSDERMAL | Status: DC

## 2016-01-18 MED ORDER — SODIUM CHLORIDE 0.9 % IR SOLN
Status: DC | PRN
  Administered 2016-01-18 (×2): 1

## 2016-01-18 MED ORDER — SENNOSIDES-DOCUSATE SODIUM 8.6-50 MG PO TABS
2.0000 | ORAL_TABLET | ORAL | Status: DC
  Administered 2016-01-19: 2 via ORAL
  Filled 2016-01-18 (×2): qty 2

## 2016-01-18 MED ORDER — METOCLOPRAMIDE HCL 5 MG/ML IJ SOLN: 10.0000 mg | Freq: Once | INTRAMUSCULAR | Status: DC | PRN

## 2016-01-18 MED ORDER — ZOLPIDEM TARTRATE 5 MG PO TABS: 5.0000 mg | ORAL_TABLET | Freq: Every evening | ORAL | Status: DC | PRN

## 2016-01-18 MED ORDER — FENTANYL CITRATE (PF) 100 MCG/2ML IJ SOLN
25.0000 ug | INTRAMUSCULAR | Status: DC | PRN
  Administered 2016-01-18: 50 ug via INTRAVENOUS

## 2016-01-18 MED ORDER — LACTATED RINGERS IV SOLN
INTRAVENOUS | Status: DC
  Administered 2016-01-18: 14:00:00 via INTRAUTERINE

## 2016-01-18 MED ORDER — NALBUPHINE HCL 10 MG/ML IJ SOLN
5.0000 mg | INTRAMUSCULAR | Status: DC | PRN
  Administered 2016-01-18 – 2016-01-19 (×2): 5 mg via INTRAVENOUS
  Filled 2016-01-18 (×2): qty 1

## 2016-01-18 MED ORDER — NALOXONE HCL 2 MG/2ML IJ SOSY: 1.0000 ug/kg/h | PREFILLED_SYRINGE | INTRAVENOUS | Status: DC | PRN

## 2016-01-18 MED ORDER — BUPIVACAINE IN DEXTROSE 0.75-8.25 % IT SOLN
INTRATHECAL | Status: AC
  Filled 2016-01-18: qty 4

## 2016-01-18 MED ORDER — PHENYLEPHRINE HCL 10 MG/ML IJ SOLN
INTRAMUSCULAR | Status: DC | PRN
  Administered 2016-01-18 (×4): 80 ug via INTRAVENOUS
  Administered 2016-01-18 (×3): 40 ug via INTRAVENOUS
  Administered 2016-01-18: 80 ug via INTRAVENOUS

## 2016-01-18 MED ORDER — DIPHENHYDRAMINE HCL 50 MG/ML IJ SOLN
INTRAMUSCULAR | Status: DC | PRN
  Administered 2016-01-18: 25 mg via INTRAVENOUS

## 2016-01-18 MED ORDER — TETANUS-DIPHTH-ACELL PERTUSSIS 5-2.5-18.5 LF-MCG/0.5 IM SUSP: 0.5000 mL | Freq: Once | INTRAMUSCULAR | Status: DC

## 2016-01-18 MED ORDER — PHENYLEPHRINE 40 MCG/ML (10ML) SYRINGE FOR IV PUSH (FOR BLOOD PRESSURE SUPPORT): 80.0000 ug | PREFILLED_SYRINGE | INTRAVENOUS | Status: DC | PRN

## 2016-01-18 MED ORDER — LACTATED RINGERS IV SOLN
INTRAVENOUS | Status: DC | PRN
  Administered 2016-01-18 (×2): via INTRAVENOUS

## 2016-01-18 MED ORDER — FENTANYL 2.5 MCG/ML BUPIVACAINE 1/10 % EPIDURAL INFUSION (WH - ANES)
14.0000 mL/h | INTRAMUSCULAR | Status: DC | PRN
Start: 2016-01-18 — End: 2016-01-18

## 2016-01-18 MED ORDER — OXYTOCIN 40 UNITS IN LACTATED RINGERS INFUSION - SIMPLE MED: 2.5000 [IU]/h | INTRAVENOUS | Status: AC

## 2016-01-18 MED ORDER — ONDANSETRON HCL 4 MG/2ML IJ SOLN
4.0000 mg | Freq: Three times a day (TID) | INTRAMUSCULAR | Status: DC | PRN
Start: 2016-01-18 — End: 2016-01-21

## 2016-01-18 MED ORDER — TERBUTALINE SULFATE 1 MG/ML IJ SOLN
0.2500 mg | Freq: Once | INTRAMUSCULAR | Status: AC | PRN
Start: 2016-01-18 — End: 2016-01-18
  Administered 2016-01-18: 0.25 mg via SUBCUTANEOUS
  Filled 2016-01-18: qty 1

## 2016-01-18 MED ORDER — OXYCODONE HCL 5 MG PO TABS
5.0000 mg | ORAL_TABLET | ORAL | Status: DC | PRN
  Administered 2016-01-19 (×2): 5 mg via ORAL
  Filled 2016-01-18 (×2): qty 1

## 2016-01-18 MED ORDER — PHENYLEPHRINE 40 MCG/ML (10ML) SYRINGE FOR IV PUSH (FOR BLOOD PRESSURE SUPPORT)
80.0000 ug | PREFILLED_SYRINGE | INTRAVENOUS | Status: DC | PRN
Start: 2016-01-18 — End: 2016-01-18

## 2016-01-18 MED ORDER — DIPHENHYDRAMINE HCL 25 MG PO CAPS: 25.0000 mg | ORAL_CAPSULE | ORAL | Status: DC | PRN

## 2016-01-18 MED ORDER — MEDROXYPROGESTERONE ACETATE 150 MG/ML IM SUSP: 150.0000 mg | INTRAMUSCULAR | Status: DC | PRN

## 2016-01-18 MED ORDER — FENTANYL CITRATE (PF) 100 MCG/2ML IJ SOLN
INTRAMUSCULAR | Status: DC | PRN
  Administered 2016-01-18: 12.5 ug via INTRATHECAL

## 2016-01-18 MED ORDER — EPHEDRINE SULFATE 50 MG/ML IJ SOLN
INTRAMUSCULAR | Status: DC | PRN
  Administered 2016-01-18: 5 mg via INTRAVENOUS

## 2016-01-18 MED ORDER — ONDANSETRON HCL 4 MG/2ML IJ SOLN
INTRAMUSCULAR | Status: DC | PRN
  Administered 2016-01-18: 4 mg via INTRAVENOUS

## 2016-01-18 MED ORDER — OXYTOCIN 10 UNIT/ML IJ SOLN
INTRAVENOUS | Status: DC | PRN
  Administered 2016-01-18: 40 [IU] via INTRAVENOUS

## 2016-01-18 MED ORDER — MEPERIDINE HCL 25 MG/ML IJ SOLN: 6.2500 mg | INTRAMUSCULAR | Status: DC | PRN

## 2016-01-18 MED ORDER — NALBUPHINE HCL 10 MG/ML IJ SOLN: 5.0000 mg | INTRAMUSCULAR | Status: DC | PRN

## 2016-01-18 MED ORDER — MENTHOL 3 MG MT LOZG: 1.0000 | LOZENGE | OROMUCOSAL | Status: DC | PRN

## 2016-01-18 MED ORDER — COCONUT OIL OIL: 1.0000 "application " | TOPICAL_OIL | Status: DC | PRN

## 2016-01-18 MED ORDER — OXYTOCIN 10 UNIT/ML IJ SOLN
INTRAMUSCULAR | Status: AC
  Filled 2016-01-18: qty 4

## 2016-01-18 MED ORDER — WITCH HAZEL-GLYCERIN EX PADS: 1.0000 "application " | MEDICATED_PAD | CUTANEOUS | Status: DC | PRN

## 2016-01-18 MED ORDER — LACTATED RINGERS IV SOLN: 500.0000 mL | Freq: Once | INTRAVENOUS | Status: DC

## 2016-01-18 MED ORDER — DEXAMETHASONE SODIUM PHOSPHATE 4 MG/ML IJ SOLN
INTRAMUSCULAR | Status: AC
  Filled 2016-01-18: qty 1

## 2016-01-18 MED ORDER — LACTATED RINGERS IV SOLN
INTRAVENOUS | Status: DC
  Administered 2016-01-19: 14:00:00 via INTRAVENOUS

## 2016-01-18 MED ORDER — NALBUPHINE HCL 10 MG/ML IJ SOLN: 5.0000 mg | Freq: Once | INTRAMUSCULAR | Status: AC | PRN

## 2016-01-18 MED ORDER — ONDANSETRON HCL 4 MG/2ML IJ SOLN
INTRAMUSCULAR | Status: AC
  Filled 2016-01-18: qty 2

## 2016-01-18 SURGICAL SUPPLY — 38 items
BENZOIN TINCTURE PRP APPL 2/3 (GAUZE/BANDAGES/DRESSINGS) ×3 IMPLANT
CHLORAPREP W/TINT 26ML (MISCELLANEOUS) ×3 IMPLANT
CLAMP CORD UMBIL (MISCELLANEOUS) IMPLANT
CLOSURE WOUND 1/2 X4 (GAUZE/BANDAGES/DRESSINGS) ×1
CLOTH BEACON ORANGE TIMEOUT ST (SAFETY) ×3 IMPLANT
CONTAINER PREFILL 10% NBF 15ML (MISCELLANEOUS) IMPLANT
DRAIN JACKSON PRT FLT 10 (DRAIN) ×3 IMPLANT
DRSG OPSITE 11X17.75 LRG (GAUZE/BANDAGES/DRESSINGS) ×3 IMPLANT
DRSG OPSITE POSTOP 4X10 (GAUZE/BANDAGES/DRESSINGS) ×3 IMPLANT
ELECT REM PT RETURN 9FT ADLT (ELECTROSURGICAL) ×3
ELECTRODE REM PT RTRN 9FT ADLT (ELECTROSURGICAL) ×1 IMPLANT
EXTRACTOR VACUUM M CUP 4 TUBE (SUCTIONS) IMPLANT
EXTRACTOR VACUUM M CUP 4' TUBE (SUCTIONS)
GLOVE BIO SURGEON STRL SZ7.5 (GLOVE) ×3 IMPLANT
GLOVE BIOGEL PI IND STRL 7.0 (GLOVE) ×1 IMPLANT
GLOVE BIOGEL PI IND STRL 7.5 (GLOVE) ×1 IMPLANT
GLOVE BIOGEL PI INDICATOR 7.0 (GLOVE) ×2
GLOVE BIOGEL PI INDICATOR 7.5 (GLOVE) ×2
GOWN STRL REUS W/TWL LRG LVL3 (GOWN DISPOSABLE) ×6 IMPLANT
KIT ABG SYR 3ML LUER SLIP (SYRINGE) IMPLANT
NEEDLE HYPO 25X5/8 SAFETYGLIDE (NEEDLE) IMPLANT
NS IRRIG 1000ML POUR BTL (IV SOLUTION) ×3 IMPLANT
PACK C SECTION WH (CUSTOM PROCEDURE TRAY) ×3 IMPLANT
PAD OB MATERNITY 4.3X12.25 (PERSONAL CARE ITEMS) ×3 IMPLANT
PENCIL SMOKE EVAC W/HOLSTER (ELECTROSURGICAL) ×3 IMPLANT
RTRCTR C-SECT PINK 25CM LRG (MISCELLANEOUS) ×3 IMPLANT
STRIP CLOSURE SKIN 1/2X4 (GAUZE/BANDAGES/DRESSINGS) ×2 IMPLANT
SUT CHROMIC 2 0 CT 1 (SUTURE) ×3 IMPLANT
SUT MNCRL AB 3-0 PS2 27 (SUTURE) ×3 IMPLANT
SUT PLAIN 2 0 XLH (SUTURE) ×6 IMPLANT
SUT SILK 2 0 SH (SUTURE) ×3 IMPLANT
SUT VIC AB 0 CT1 36 (SUTURE) ×3 IMPLANT
SUT VIC AB 0 CTX 36 (SUTURE) ×6
SUT VIC AB 0 CTX36XBRD ANBCTRL (SUTURE) ×3 IMPLANT
SUT VIC AB 2-0 SH 27 (SUTURE) ×4
SUT VIC AB 2-0 SH 27XBRD (SUTURE) ×2 IMPLANT
TOWEL OR 17X24 6PK STRL BLUE (TOWEL DISPOSABLE) ×3 IMPLANT
TRAY FOLEY CATH SILVER 14FR (SET/KITS/TRAYS/PACK) ×3 IMPLANT

## 2016-01-18 NOTE — Progress Notes (Signed)
Kendra Nguyen is a 35 y.o. G1P0 at [redacted]w[redacted]d   Subjective: No complaints  Objective: BP 129/83   Pulse 76   Temp 99 F (37.2 C) (Oral)   Resp 16   Ht 5\' 6"  (1.676 m)   Wt (!) 326 lb (147.9 kg)   BMI 52.62 kg/m  No intake/output data recorded. No intake/output data recorded.  FHT:  FHR: 120s bpm, variability: moderate,  accelerations:  Present,  decelerations:  Absent UC:   regular, every 3-5 minutes SVE:   Dilation: 1 Effacement (%): 50 Station: -3 Exam by:: [employer]  Labs: Lab Results  Component Value Date   WBC 10.3 01/18/2016   HGB 10.1 (L) 01/18/2016   HCT 28.5 (L) 01/18/2016   MCV 83.6 01/18/2016   PLT 190 01/18/2016    Assessment / Plan: induction  Labor: cont to titrate pitocin as indicated Preeclampsia:  no signs or symptoms of toxicity Fetal Wellbeing:  Category I Pain Control:  Labor support without medications I/D:  GBS neg Anticipated MOD:  NSVD  Lashun Ramseyer Y 01/18/2016, 5:39 PM

## 2016-01-18 NOTE — Progress Notes (Signed)
Kendra Nguyen is a 35 y.o. G1P0 at [redacted]w[redacted]d admitted for induction secondary to preeclampsia without severe features.  Subjective: No complaints.  Feels occasional contractions.  Objective: BP 121/68   Pulse 65   Temp 98 F (36.7 C) (Oral)   Resp 16   Ht 5\' 6"  (1.676 m)   Wt (!) 326 lb (147.9 kg)   BMI 52.62 kg/m  No intake/output data recorded. No intake/output data recorded.  FHT:  FHR: 140s-150s bpm, variability: minimal ,  accelerations:  Present,  decelerations:  Present mild and non-recurrent UC:   irregular SVE:   Dilation: 1 Effacement (%): Thick Station: -3 Exam by:: Dr Cecilie Kicks Mec  -Labs: Lab Results  Component Value Date   WBC 9.0 01/17/2016   HGB 11.1 (L) 01/17/2016   HCT 31.7 (L) 01/17/2016   MCV 84.5 01/17/2016   PLT 227 01/17/2016    Assessment / Plan: Induction of labor due to preeclampsia without severe features  Labor: IUPC and FSE placed Preeclampsia:  no signs or symptoms of toxicity Fetal Wellbeing:  Category I mostly with occasional runs of cat 2, will cont to observe Pain Control:  Labor support without medications I/D:  GBS neg Anticipated MOD:  NSVD  Jimmie Rueter Y 01/18/2016, 8:12 AM

## 2016-01-18 NOTE — Anesthesia Procedure Notes (Signed)
Spinal  Patient location during procedure: OR Staffing Anesthesiologist: Montez Hageman Performed: anesthesiologist  Preanesthetic Checklist Completed: patient identified, site marked, surgical consent, pre-op evaluation, timeout performed, IV checked, risks and benefits discussed and monitors and equipment checked Spinal Block Patient position: sitting Prep: ChloraPrep Patient monitoring: heart rate, continuous pulse ox and blood pressure Approach: midline Location: L3-4 Injection technique: single-shot Needle Needle type: Sprotte  Needle gauge: 24 G Needle length: 9 cm Catheter type: closed end flexible Catheter size: 19 g Catheter at skin depth: 14 cm Additional Notes CSE.  Expiration date of kit checked and confirmed. Patient tolerated procedure well, without complications.

## 2016-01-18 NOTE — Progress Notes (Signed)
Kendra Nguyen is a 35 y.o. G1P0 at [redacted]w[redacted]d   Subjective: No complaints  Objective: BP (!) 121/57   Pulse 80   Temp 99 F (37.2 C) (Oral)   Resp 16   Ht 5\' 6"  (1.676 m)   Wt (!) 326 lb (147.9 kg)   BMI 52.62 kg/m  No intake/output data recorded. No intake/output data recorded.  FHT:  FHR: 120s bpm, variability: moderate,  accelerations:  Present,  decelerations:  Absent UC:   occas s/p terbutaline SVE:   Dilation: Fingertip Effacement (%): 50 Station: -3 Exam by:: Dr. Ihor Dow  Labs: Lab Results  Component Value Date   WBC 10.3 01/18/2016   HGB 10.1 (L) 01/18/2016   HCT 28.5 (L) 01/18/2016   MCV 83.6 01/18/2016   PLT 190 01/18/2016    Assessment / Plan: induction  Labor: pitocin being held secondary to prolonged decel Preeclampsia:  no signs or symptoms of toxicity Fetal Wellbeing:  Category I Pain Control:  Labor support without medications I/D:  GBS neg Anticipated MOD:  I have discussed the decel and the fact that she is remote from delivery and reviewed options for continued expectant mgmt with restarting pitocin versus c-section.  R/B/A reviewed including but not limited to bleeding infection and injury.  Questions answered.  Patient discussed with her husband and would like to proceed with c-section.  [name] 01/18/2016, 6:23 PM

## 2016-01-18 NOTE — Transfer of Care (Signed)
Immediate Anesthesia Transfer of Care Note  Patient: Kendra Nguyen  Procedure(s) Performed: Procedure(s): CESAREAN SECTION (N/A)  Patient Location: PACU  Anesthesia Type:Spinal/ Epidural  Level of Consciousness: awake, alert  and oriented  Airway & Oxygen Therapy: Patient Spontanous Breathing  Post-op Assessment: Report given to RN and Post -op Vital signs reviewed and stable  Post vital signs: Reviewed  Last Vitals:  Vitals:   01/18/16 1731 01/18/16 1801  BP: (!) 120/59 (!) 121/57  Pulse: 82 80  Resp:    Temp:      Last Pain:  Vitals:   01/18/16 1601  TempSrc: Oral  PainSc:          Complications: No apparent anesthesia complications

## 2016-01-18 NOTE — Anesthesia Postprocedure Evaluation (Signed)
Anesthesia Post Note  Patient: Kendra Nguyen  Procedure(s) Performed: Procedure(s) (LRB): CESAREAN SECTION (N/A)  Patient location during evaluation: PACU Anesthesia Type: Spinal Level of consciousness: awake and alert Pain management: pain level controlled Vital Signs Assessment: post-procedure vital signs reviewed and stable Respiratory status: spontaneous breathing and respiratory function stable Cardiovascular status: blood pressure returned to baseline and stable Postop Assessment: no headache, no backache and spinal receding Anesthetic complications: no        Last Vitals:  Vitals:   01/18/16 2105 01/18/16 2135  BP:  120/63  Pulse: 82 68  Resp: (!) 22 20  Temp:  36.4 C    Last Pain:  Vitals:   01/18/16 2135  TempSrc: Oral  PainSc:    Pain Goal:                 Montez Hageman

## 2016-01-18 NOTE — Progress Notes (Signed)
Difficult to trace FHT's due to maternal size.  Adjusted monitors after repositioning pt.

## 2016-01-18 NOTE — Op Note (Signed)
Cesarean Section Procedure Note  Indications: P0 at 14 5/7wks undergoing induction secondary to preeclampsia without severe features with prolonged decel remote from delivery opting for c-section.  Pre-operative Diagnosis: 1.38 5/7wks 2.Preeclampsia 3.Induction 4.Prolonged decel remote from delivery/Fetal Intolerance of Labor   Post-operative Diagnosis: 1.38 5/7wks 2.Preeclampsia 3.Induction 4.Prolonged decel remote from delivery/Fetal Intolerance of labor  Procedure: CESAREAN SECTION  Surgeon: Everett Graff, MD    Assistants: Maida Sale, RNFA  Anesthesia: Regional  Anesthesiologist: Montez Hageman, MD   Procedure Details  The patient was taken to the operating room secondary to fetal intolerance of labor remote from delivery after the risks, benefits, complications, treatment options, and expected outcomes were discussed with the patient.  The patient concurred with the proposed plan, giving informed consent which was signed and witnessed. The patient was taken to Operating Room C-Section Suite, identified as TAUN[facility] BIGMAN and the procedure verified as C-Section Delivery. A Time Out was held and the above information confirmed.  After induction of anesthesia by obtaining a spinal, the patient was prepped and draped in the usual sterile manner. A Pfannenstiel skin incision was made and carried down through the subcutaneous tissue to the underlying layer of fascia.  The fascia was incised bilaterally and extended transversely bilaterally with the Mayo scissors. Kocher clamps were placed on the inferior aspect of the fascial incision and the underlying rectus muscle was separated from the fascia. The same was done on the superior aspect of the fascial incision.  The peritoneum was identified, entered bluntly and extended manually.  An Alexis self-retaining retractor was placed.  The utero-vesical peritoneal reflection was incised transversely and the bladder flap was bluntly freed from the  lower uterine segment. A low transverse uterine incision was made with the scalpel and extended bilaterally with the bandage scissors.  The infant was delivered in vertex position without difficulty.  After the umbilical cord was clamped and cut, the infant was handed to the awaiting pediatricians.  Cord blood was obtained for evaluation.  The placenta was removed intact and appeared to be within normal limits. The uterus was cleared of all clots and debris. The uterine incision was closed with running interlocking sutures of 0 Vicryl and a second imbricating layer was performed as well.   Bilateral tubes and ovaries appeared to be within normal limits.  Good hemostasis was noted.  Copious irrigation was performed until clear.  The peritoneum was repaired with 2-0 chromic via a running suture.  The fascia was reapproximated with a running suture of 0 Vicryl. The subcutaneous tissue was reapproximated with 3 interrupted sutures of 2-0 plain.  A JP drain was placed.  The skin was reapproximated with a subcuticular suture of 3-0 monocryl.  Steristrips were applied.  Instrument, sponge, and needle counts were correct prior to abdominal closure and at the conclusion of the case.  The patient was awaiting transfer to the recovery room in good condition.  Findings: Live female infant with Apgars 8 at one minute and 9 at five minutes.  Normal appearing bilateral ovaries and fallopian tubes were noted.  3-4cm fibroid on anterior wall of uterus and another about the same size on rt lateral side of uterus.  Nuchal and body cord.  Estimated Blood Loss:  600 ml         Drains: foley to gravity 350 cc         Total IV Fluids: 1200 ml         Specimens to Pathology: n/a  Complications:  None; patient tolerated the procedure well.         Disposition: PACU - hemodynamically stable.         Condition: stable  Attending Attestation: I performed the procedure.

## 2016-01-18 NOTE — Progress Notes (Signed)
Kendra Nguyen MRN: [id number]  Subjective: -Nurse call reports patient s/p bolus and with O2 via NRB mask, but variable decelerations continue.  In room to assess.  Patient reports no perception of contractions since about 0500.  Objective: BP (!) 132/47   Pulse 76   Temp 98.2 F (36.8 C) (Oral)   Resp 18   Ht 5\' 6"  (1.676 m)   Wt (!) 147.9 kg (326 lb)   BMI 52.62 kg/m  No intake/output data recorded. No intake/output data recorded.  Fetal Monitoring: FHT: 150 bpm, Mod Var, + Variable Decels, +Accels UC: None graphed or palpated    Vaginal Exam: SVE:   Dilation: 1 Effacement (%):Thick Station: Ballotable Exam by: Milinda Cave, CNM Membranes:Intact Internal Monitors: None  Augmentation/Induction: Pitocin:None Cytotec: S/P 2 Doses   Assessment:  IUP at 38.5wks Cat II FT  PreEclampsia IOL  Plan: -Continue position change  -Discussed potential for cesarean section due to fetal distress in early induction process -Informed patient that consult needs to be had with Dr. Gillermo Murdoch, oncoming MD regarding patient situation -Reviewed r/b of c/s including, but not limited to, infection, bleeding, pain, damage to organs or fetus resulting in need for additional surgery.  Patient understands and accepts these risks and wishes to proceed with c/s if necessary -Continue other mgmt as ordered  Riley Churches, CNM 01/18/2016, 6:40 AM

## 2016-01-18 NOTE — Anesthesia Preprocedure Evaluation (Signed)
Anesthesia Evaluation  Patient identified by MRN, date of birth, ID band Patient awake    Reviewed: Allergy & Precautions, NPO status , Patient's Chart, lab work & pertinent test results  Airway Mallampati: II  TM Distance: >3 FB Neck ROM: Full    Dental no notable dental hx.    Pulmonary neg pulmonary ROS,    Pulmonary exam normal breath sounds clear to auscultation       Cardiovascular hypertension, Normal cardiovascular exam Rhythm:Regular Rate:Normal     Neuro/Psych negative neurological ROS  negative psych ROS   GI/Hepatic negative GI ROS, Neg liver ROS,   Endo/Other  Morbid obesity  Renal/GU negative Renal ROS  negative genitourinary   Musculoskeletal negative musculoskeletal ROS (+)   Abdominal (+) + obese,   Peds negative pediatric ROS (+)  Hematology negative hematology ROS (+)   Anesthesia Other Findings   Reproductive/Obstetrics (+) Pregnancy                             Anesthesia Physical Anesthesia Plan  ASA: III and emergent  Anesthesia Plan: Combined Spinal and Epidural   Post-op Pain Management:    Induction:   Airway Management Planned: Natural Airway  Additional Equipment:   Intra-op Plan:   Post-operative Plan:   Informed Consent: I have reviewed the patients History and Physical, chart, labs and discussed the procedure including the risks, benefits and alternatives for the proposed anesthesia with the patient or authorized representative who has indicated his/her understanding and acceptance.   Dental advisory given  Plan Discussed with: CRNA  Anesthesia Plan Comments:         Anesthesia Quick Evaluation

## 2016-01-18 NOTE — Progress Notes (Signed)
Kendra Nguyen is a 35 y.o. G1P0 at [redacted]w[redacted]d admitted for induction secondary to preeclampsia without severe features  Subjective: No complaints  Objective: BP 125/71   Pulse 76   Temp 97.7 F (36.5 C) (Oral)   Resp 18   Ht 5\' 6"  (1.676 m)   Wt (!) 326 lb (147.9 kg)   BMI 52.62 kg/m  No intake/output data recorded. No intake/output data recorded.  FHT:  FHR: 130s-140s bpm, variability: moderate,  accelerations:  Present,  decelerations:  Absent UC:   irregular, every 3-6 minutes SVE:   Dilation: 1 Effacement (%): 50 Station: -3 Exam by:: [employer]  Labs: Lab Results  Component Value Date   WBC 10.3 01/18/2016   HGB 10.1 (L) 01/18/2016   HCT 28.5 (L) 01/18/2016   MCV 83.6 01/18/2016   PLT 190 01/18/2016    Assessment / Plan: Induction secondary to preeclampsia without severe features and asymptomatic  Labor: Progressing normally on pitocin Preeclampsia:  no signs or symptoms of toxicity Fetal Wellbeing:  Category I Pain Control:  Labor support without medications I/D:  GBS neg Anticipated MOD:  NSVD  Seville Brick Y 01/18/2016, 12:56 PM

## 2016-01-18 NOTE — Progress Notes (Signed)
VENNIE BIFFLE MRN: [id number]  Subjective: -Nurse call reports variable decels since 0400 and continued contractions q 2-74min since 0230.  Strip reviewed.  Objective: BP 138/65   Pulse 77   Temp 97.9 F (36.6 C) (Oral)   Resp 18   Ht 5\' 6"  (1.676 m)   Wt (!) 147.9 kg (326 lb)   BMI 52.62 kg/m  No intake/output data recorded. No intake/output data recorded.  Fetal Monitoring: FHT: 150 bpm, Mod Var, +Variable Decels, +Accels UC: None graphed    Vaginal Exam: SVE:   Dilation: 1.5 Effacement (%): 50 Station: -3 Exam by:: amwalker,rn Membranes:Intact Internal Monitors: None  Augmentation/Induction: Pitocin:None Cytotec: S/P 2 Doses  Assessment:  IUP at 38.5wks Cat II FT  PreEclampsia IOL Bishop Score: 4   Plan: -Nurse instructed to evaluate cervix  -Give IV fluid bolus -Cat II FT correcting with reassuring tracing noted at 0430  -Will allow patient time to rest and assess for foley bulb vs cytotec dosing at 0630 -Continue other mgmt as ordered   Riley Churches, CNM 01/18/2016, 4:32 AM

## 2016-01-19 ENCOUNTER — Encounter (HOSPITAL_COMMUNITY)
Admission: RE | Admit: 2016-01-19 | Discharge: 2016-01-19 | Disposition: A | Discharge status: Home / Self Care | Payer: BC Managed Care – PPO | Source: Ambulatory Visit

## 2016-01-19 ENCOUNTER — Encounter (HOSPITAL_COMMUNITY): Payer: Self-pay | Admitting: Certified Registered Nurse Anesthetist

## 2016-01-19 ENCOUNTER — Encounter (HOSPITAL_COMMUNITY): Payer: Self-pay | Admitting: Obstetrics and Gynecology

## 2016-01-19 LAB — COMPREHENSIVE METABOLIC PANEL
ALT: 32 U/L (ref 14–54)
AST: 47 U/L — ABNORMAL HIGH (ref 15–41)
Albumin: 2.3 g/dL — ABNORMAL LOW (ref 3.5–5.0)
Alkaline Phosphatase: 95 U/L (ref 38–126)
Anion gap: 6 (ref 5–15)
BUN: 6 mg/dL (ref 6–20)
CO2: 20 mmol/L — ABNORMAL LOW (ref 22–32)
Calcium: 8.1 mg/dL — ABNORMAL LOW (ref 8.9–10.3)
Chloride: 108 mmol/L (ref 101–111)
Creatinine, Ser: 0.83 mg/dL (ref 0.44–1.00)
GFR calc Af Amer: >60 mL/min (ref >60)
GFR calc non Af Amer: >60 mL/min (ref >60)
Glucose, Bld: 131 mg/dL — ABNORMAL HIGH (ref 65–99)
Potassium: 4.1 mmol/L (ref 3.5–5.1)
Sodium: 134 mmol/L — ABNORMAL LOW (ref 135–145)
Total Bilirubin: 1.1 mg/dL (ref 0.3–1.2)
Total Protein: 5.3 g/dL — ABNORMAL LOW (ref 6.5–8.1)

## 2016-01-19 LAB — CBC
HCT: 26.1 % — ABNORMAL LOW (ref 36.0–46.0)
Hemoglobin: 9.3 g/dL — ABNORMAL LOW (ref 12.0–15.0)
MCH: 29.8 pg (ref 26.0–34.0)
MCHC: 35.6 g/dL (ref 30.0–36.0)
MCV: 83.7 fL (ref 78.0–100.0)
Platelets: 178 10*3/uL (ref 150–400)
RBC: 3.12 MIL/uL — ABNORMAL LOW (ref 3.87–5.11)
RDW: 14.8 % (ref 11.5–15.5)
WBC: 15.9 10*3/uL — ABNORMAL HIGH (ref 4.0–10.5)

## 2016-01-19 LAB — URIC ACID: Uric Acid, Serum: 6.9 mg/dL — ABNORMAL HIGH (ref 2.3–6.6)

## 2016-01-19 LAB — LACTATE DEHYDROGENASE: LDH: 277 U/L — ABNORMAL HIGH (ref 98–192)

## 2016-01-19 NOTE — Progress Notes (Signed)
Kendra Nguyen, Kendra Nguyen Female, [place] y.o., April 18, 1980  Subjective: Postpartum Day 1: Cesarean Delivery Patient reports tolerating PO and no problems voiding.    Objective: Vital signs in last 24 hours: Temp:  [97.5 F (36.4 C)-99 F (37.2 C)] 97.9 F (36.6 C) (12/29 0920) Pulse Rate:  [54-89] 72 (12/29 0920) Resp:  [16-22] 20 (12/29 0920) BP: (103-144)/(55-92) 122/71 (12/29 0920) SpO2:  [94 %-100 %] 98 % (12/29 0920)  Physical Exam:  General: alert, cooperative and no distress Lochia: appropriate Uterine Fundus: firm Incision: dried blood on left side of incision, otherwise clean, dry and intact.  JP drain in place.  DVT Evaluation: No evidence of DVT seen on physical exam.    Recent Labs  01/18/16 2200 01/19/16 0541  HGB 10.2* 9.3*  HCT 29.5* 26.1*    CBC    Component Value Date/Time   WBC 15.9 (H) 01/19/2016 0541   RBC 3.12 (L) 01/19/2016 0541   HGB 9.3 (L) 01/19/2016 0541   HGB 13.5 03/05/2012 1328   HCT 26.1 (L) 01/19/2016 0541   HCT 40.9 03/05/2012 1328   PLT 178 01/19/2016 0541   PLT 291 03/05/2012 1328   MCV 83.7 01/19/2016 0541   MCV 84.0 07/23/2014 0912   MCV 88 03/05/2012 1328   MCH 29.8 01/19/2016 0541   MCHC 35.6 01/19/2016 0541   RDW 14.8 01/19/2016 0541   RDW 12.8 03/05/2012 1328   CMP     Component Value Date/Time   NA 134 (L) 01/19/2016 0541   NA 139 06/06/2014 1615   NA 137 03/05/2012 1328   K 4.1 01/19/2016 0541   K 3.8 03/05/2012 1328   CL 108 01/19/2016 0541   CL 108 (H) 03/05/2012 1328   CO2 20 (L) 01/19/2016 0541   CO2 20 (L) 03/05/2012 1328   GLUCOSE 131 (H) 01/19/2016 0541   GLUCOSE 91 03/05/2012 1328   BUN 6 01/19/2016 0541   BUN 9 06/06/2014 1615   BUN 10 03/05/2012 1328   CREATININE 0.83 01/19/2016 0541   CREATININE 0.57 07/23/2014 0912   CALCIUM 8.1 (L) 01/19/2016 0541   CALCIUM 8.7 03/05/2012 1328   PROT 5.3 (L) 01/19/2016 0541   PROT 7.8 03/05/2012 1328   ALBUMIN 2.3 (L) 01/19/2016 0541   ALBUMIN 3.9 03/05/2012 1328    AST 47 (H) 01/19/2016 0541   AST 22 03/05/2012 1328   ALT 32 01/19/2016 0541   ALT 20 03/05/2012 1328   ALKPHOS 95 01/19/2016 0541   ALKPHOS 70 03/05/2012 1328   BILITOT 1.1 01/19/2016 0541   BILITOT 1.2 (H) 03/05/2012 1328   GFRNONAA >60 01/19/2016 0541   GFRNONAA >89 07/23/2014 0912   GFRAA >60 01/19/2016 0541   GFRAA >89 07/23/2014 0912   01/19/16: LDH 277 Uric acid 6.9  Assessment/Plan: Status post Cesarean section. Doing well postoperatively.  Continue current care. Out of bed and ambulation encouraged.  Mild elevation of LFTs noted, patient with normal BPs and without symptoms, follow up in office.    Alinda Dooms, MD 01/19/2016, 12:47 PM

## 2016-01-19 NOTE — Addendum Note (Signed)
Addendum  created 01/19/16 0747 by Hewitt Blade, CRNA   Sign clinical note

## 2016-01-19 NOTE — Anesthesia Postprocedure Evaluation (Signed)
Anesthesia Post Note  Patient: Kendra Nguyen  Procedure(s) Performed: Procedure(s) (LRB): CESAREAN SECTION (N/A)  Patient location during evaluation: Mother Baby Anesthesia Type: Spinal Level of consciousness: awake and alert Pain management: pain level controlled Vital Signs Assessment: post-procedure vital signs reviewed and stable Respiratory status: spontaneous breathing and nonlabored ventilation Cardiovascular status: stable Postop Assessment: no headache, patient able to bend at knees, no backache, no signs of nausea or vomiting, adequate PO intake and spinal receding Anesthetic complications: no        Last Vitals:  Vitals:   01/19/16 0045 01/19/16 0541  BP:  122/67  Pulse:  67  Resp: 20   Temp: 36.8 C 36.4 C    Last Pain:  Vitals:   01/19/16 0345  TempSrc:   PainSc: 0-No pain   Pain Goal:                 [employer]

## 2016-01-19 NOTE — Lactation Note (Signed)
This note was copied from a baby's chart. Lactation Consultation Note: This is mothers first child. She has a history of  gastric bypass.  Lactation Brochure given with basic teaching done. Mother taught hand expression. Observed good flow of colostrum.  Infant placed in football hold on the R breast. Observed good latch. Infants lips flanged well. Observed frequent swallows. Parents taught how to check latch and listen for swallows. Lots of teaching with parents. Advised to cue base feed infant and at least 8-12 times in 24 hours.  Advised to do frequent skin to skin. Mother is aware of available lactation services. Parents receptive to all teaching.   Patient Name: Kendra Nguyen [id number] Date: 01/19/2016 Reason for consult: Follow-up assessment   Maternal Data    Feeding Feeding Type: Breast Fed Length of feed: 20 min  LATCH Score/Interventions Latch: Grasps breast easily, tongue down, lips flanged, rhythmical sucking.  Audible Swallowing: Spontaneous and intermittent  Type of Nipple: Everted at rest and after stimulation  Comfort (Breast/Nipple): Soft / non-tender     Hold (Positioning): Assistance needed to correctly position infant at breast and maintain latch. Intervention(s): Breastfeeding basics reviewed;Support Pillows;Position options;Skin to skin  LATCH Score: 9  Lactation Tools Discussed/Used     Consult Status Consult Status: Follow-up Date: 01/19/16 Follow-up type: In-patient    Jess Barters [facility] 01/19/2016, 12:06 PM

## 2016-01-19 NOTE — Progress Notes (Signed)
Epidural removed 01/18/16 at 2357 by this RN, epidural catheter intact. Rachael Fee, RN

## 2016-01-20 NOTE — Plan of Care (Signed)
Problem: Life Cycle: Goal: Risk for postpartum hemorrhage will decrease Outcome: Progressing Pt passed blood clot at 0545 this morning, RN made aware, clot kept to show MD on morning rounds

## 2016-01-20 NOTE — Lactation Note (Addendum)
This note was copied from a baby's chart. Lactation Consultation Note  Patient Name: Kendra Nguyen [id number] Date: 01/20/2016 Reason for consult: Follow-up assessment;Other (Comment) (low blood glucose/jittery.)  Baby 6 hours old. Baby jittery/low blood glucose. Mom reports that she just finished nursing the baby for 20 minutes and baby nursed well--she heard swallows. Assisted parents to hand express and spoon-feed baby 5 ml of EBM. Mom easily expressible, bilaterally. Assisted mom to latch baby to right breast in football position. Mom has large breasts with everted nipples. Assisted mom to position baby so he is not pressed into the breast. Enc mom to "teacup" nipple and continue to hold until baby able to pull the nipple into his mouth. Baby latched deeply and suckled rhythmically with some swallows noted. Enc mom to continue to stimulate baby while he is at the breast to keep him actively nursing. Mom reports that the baby has been sleepy, and she has just been taking him off the breast and offering STS instead of stimulating him to keep nursing. Enc active nursing for at least 15 to 20 minutes at his feeding. Parents aware that baby have another serum drawn. Enc nursing as long as the baby will continue nursing with stimulation as needed, and then STS. Parents report that they are offering the breast every 2-3 hours. Discussed assessment and interventions with patient's bedside RN, Jackelyn Poling.   Mom reports that her gastric sleeve was placed about a year ago.   Maternal Data    Feeding Feeding Type: Breast Fed (LC assessed first 10 minutes of BF. ) Length of feed: 20 min  LATCH Score/Interventions Latch: Grasps breast easily, tongue down, lips flanged, rhythmical sucking. Intervention(s): Adjust position;Assist with latch;Breast compression  Audible Swallowing: A few with stimulation Intervention(s): Skin to skin;Hand expression  Type of Nipple: Everted at rest and after  stimulation  Comfort (Breast/Nipple): Soft / non-tender     Hold (Positioning): Assistance needed to correctly position infant at breast and maintain latch.  LATCH Score: 8  Lactation Tools Discussed/Used     Consult Status Consult Status: Follow-up Date: 01/21/16 Follow-up type: In-patient    Andres Labrum 01/20/2016, 5:32 PM

## 2016-01-20 NOTE — Progress Notes (Signed)
Subjective: Postpartum Day 2: Cesarean Delivery Patient reports that pain is well-managed.Lochia normal.  Ambulating, voiding, tolerating diet as ordered without difficulty. Normal flatus.  Absent bowel movement.  Objective: Vital signs in last 24 hours: Temp:  [97.6 F (36.4 C)-98.3 F (36.8 C)] 97.6 F (36.4 C) (12/30 0641) Pulse Rate:  [60-70] 62 (12/30 0641) Resp:  [18-20] 18 (12/30 0641) BP: (115-130)/(62-75) 130/62 (12/30 0641) SpO2:  [99 %] 99 % (12/29 1543)  Physical Exam:  General: alert Lochia: appropriate Uterine Fundus: firm and appropriately tender Incision: dressing is not adhereing; will switch out dressing today DVT Evaluation: No evidence of DVT seen on physical exam. Edema 1+   Recent Labs  01/18/16 2200 01/19/16 0541  HGB 10.2* 9.3*  HCT 29.5* 26.1*    Assessment/Plan: Status post Cesarean section. Doing well postoperatively.  Continue current care. Anticipate discharge tomorrow  Bertram Gala Clemmons CNM 01/20/2016, 10:08 AM

## 2016-01-21 ENCOUNTER — Encounter (HOSPITAL_COMMUNITY): Admission: RE | Payer: Self-pay | Source: Ambulatory Visit

## 2016-01-21 ENCOUNTER — Inpatient Hospital Stay (HOSPITAL_COMMUNITY)
Admission: RE | Admit: 2016-01-21 | Payer: BC Managed Care – PPO | Source: Ambulatory Visit | Admitting: Obstetrics and Gynecology

## 2016-01-21 SURGERY — Surgical Case
Anesthesia: Regional

## 2016-01-21 MED ORDER — HYDROCODONE-ACETAMINOPHEN 5-325 MG PO TABS: 1.0000 | ORAL_TABLET | Freq: Four times a day (QID) | ORAL | 0 refills | Status: DC | PRN

## 2016-01-21 MED ORDER — IBUPROFEN 600 MG PO TABS: 600.0000 mg | ORAL_TABLET | Freq: Four times a day (QID) | ORAL | 0 refills | Status: DC

## 2016-01-21 NOTE — Discharge Summary (Signed)
Obstetric Discharge Summary Reason for Admission: induction of labor Prenatal Procedures: Preeclampsia and ultrasound Intrapartum Procedures: cesarean: low cervical, transverse Postpartum Procedures: none Complications-Operative and Postpartum: none Hemoglobin  Date Value Ref Range Status  01/19/2016 9.3 (L) 12.0 - 15.0 g/dL Final   HGB  Date Value Ref Range Status  03/05/2012 13.5 12.0 - 16.0 g/dL Final   HCT  Date Value Ref Range Status  01/19/2016 26.1 (L) 36.0 - 46.0 % Final  03/05/2012 40.9 35.0 - 47.0 % Final    Physical Exam:  General: alert and cooperative Lochia: appropriate Uterine Fundus: firm Incision: healing well, no significant drainage DVT Evaluation: No evidence of DVT seen on physical exam.  Discharge Diagnoses: Term Pregnancy-delivered and [facility] Course:   The patient came in labor and had a CS for 38wks PIH labs, elevated bp by Dr Pierre Bali MD.  Post operatively she did well.  She tolerated a regular diet and her exam is WNL and documented in the chart. .  She has recovered well and is ready for discharge.  She is breast feeding.  She did not receive magnesium or blood pressure meds for her elevated blood pressure or preeclampsia without severe features.    She will return on Thursday for bp check and to remove the JP drain   Discharge Information: Date: 01/21/2016 Activity: pelvic rest Diet: routine Medications: Ibuprofen and Vicodin Condition: stable Instructions: refer to practice specific booklet Discharge to: home [facility] Follow up in 4 day(s).   Specialty:  Obstetrics and Gynecology Why:  you will recieve a call on Thursday Contact information: [address] [place] [phone]          Newborn Data: Live born female  Birth Weight: 8 lb 4.8 oz (3765 g) APGAR: 8, 9  Home with mother.  [place] A 01/21/2016,  1:46 PM

## 2016-01-21 NOTE — Discharge Instructions (Signed)
Cesarean Delivery °Cesarean birth, or cesarean delivery, is the surgical delivery of a baby through an incision in the abdomen and the uterus. This may be referred to as a C-section. This procedure may be scheduled ahead of time, or it may be done in an emergency situation. °Tell a health care provider about: °· Any allergies you have. °· All medicines you are taking, including vitamins, herbs, eye drops, creams, and over-the-counter medicines. °· Any problems you or family members have had with anesthetic medicines. °· Any blood disorders you have. °· Any surgeries you have had. °· Any medical conditions you have. °· Whether you or any members of your family have a history of deep vein thrombosis (DVT) or pulmonary embolism (PE). °What are the risks? °Generally, this is a safe procedure. However, problems may occur, including: °· Infection. °· Bleeding. °· Allergic reactions to medicines. °· Damage to other structures or organs. °· Blood clots. °· Injury to your baby. ° °What happens before the procedure? °· Follow instructions from your health care provider about eating or drinking restrictions. °· Follow instructions from your health care provider about bathing before your procedure to help reduce your risk of infection. °· If you know that you are going to have a cesarean delivery, do not shave your pubic area. Shaving before the procedure may increase your risk of infection. °· Ask your health care provider about: °? Changing or stopping your regular medicines. This is especially important if you are taking diabetes medicines or blood thinners. °? Your pain management plan. This is especially important if you plan to breastfeed your baby. °? How long you will be in the hospital after the procedure. °? Any concerns you may have about receiving blood products if you need them during the procedure. °? Cord blood banking, if you plan to collect your baby’s umbilical cord blood. °· You may also want to ask your  health care provider: °? Whether you will be able to hold or breastfeed your baby while you are still in the operating room. °? Whether your baby can stay with you immediately after the procedure and during your recovery. °? Whether a family member or a person of your choice can go with you into the operating room and stay with you during the procedure, immediately after the procedure, and during your recovery. °· Plan to have someone drive you home when you are discharged from the hospital. °What happens during the procedure? °· Fetal monitors will be placed on your abdomen to monitor your heart rate and your baby's heart rate. °· Depending on the reason for your cesarean delivery, you may have a physical exam or additional testing, such as an ultrasound. °· An IV tube will be inserted into one of your veins. °· You may have your blood or urine tested. °· You will be given antibiotic medicine to help prevent infection. °· You may be given a special warming gown to wear to keep your temperature stable. °· Hair may be removed from your pubic area. °· The skin of your pubic area and lower abdomen will be cleaned with a germ-killing solution (antiseptic). °· A catheter may be inserted into your bladder through your urethra. This drains your urine during the procedure. °· You may be given one or more of the following: °? A medicine to numb the area (local anesthetic). °? A medicine to make you fall asleep (general anesthetic). °? A medicine (regional anesthetic) that is injected into your back or through a small   thin tube placed in your back (spinal anesthetic or epidural anesthetic). This numbs everything below the injection site and allows you to stay awake during your procedure. If this makes you feel nauseous, tell your health care provider. Medicines will be available to help reduce any nausea you may feel. °· An incision will be made in your abdomen, and then in your uterus. °· If you are awake during your  procedure, you may feel tugging and pulling in your abdomen, but you should not feel pain. If you feel pain, tell your health care provider immediately. °· Your baby will be removed from your uterus. You may feel more pressure or pushing while this happens. °· Immediately after birth, your baby will be dried and kept warm. You may be able to hold and breastfeed your baby. The umbilical cord may be clamped and cut during this time. °· Your placenta will be removed from your uterus. °· Your incisions will be closed with stitches (sutures). Staples, skin glue, or adhesive strips may also be applied to the incision in your abdomen. °· Bandages (dressings) will be placed over the incision in your abdomen. °The procedure may vary among health care providers and hospitals. °What happens after the procedure? °· Your blood pressure, heart rate, breathing rate, and blood oxygen level will be monitored often until the medicines you were given have worn off. °· You may continue to receive fluids and medicines through an IV tube. °· You will have some pain. Medicines will be available to help control your pain. °· To help prevent blood clots: °? You may be given medicines. °? You may have to wear compression stockings or devices. °? You will be encouraged to walk around when you are able. °· Hospital staff will encourage and support bonding with your baby. Your hospital may allow you and your baby to stay in the same room (rooming in) during your hospital stay to encourage successful breastfeeding. °· You may be encouraged to cough and breathe deeply often. This helps to prevent lung problems. °· If you have a catheter draining your urine, it will be removed as soon as possible after your procedure. °This information is not intended to replace advice given to you by your health care provider. Make sure you discuss any questions you have with your health care provider. °Document Released: 01/07/2005 Document Revised: 06/15/2015  Document Reviewed: 10/18/2014 °Elsevier Interactive Patient Education © 2017 [employer] ° °

## 2016-01-21 NOTE — Lactation Note (Signed)
This note was copied from a baby's chart. Lactation Consultation Note  Patient Name: Kendra Nguyen [id number] Date: 01/21/2016 Reason for consult: Follow-up assessment Baby at 33 hr of life and dyad set for D/C. Set mom up with a Double SNS for home use. Parents have been bf on demand then using a 5Fr to finger feed formula per MD orders. Mom has a personal Lanishoh DEBP in the room. Mom will offer the breast on demand 8+/24hr using the SNS at the breast and then post pump. Parents are aware of the supplementing volume guidelines and understand that as baby grows the volume will increase. She will use her expressed milk then formula as needed until the Peds visit on 01/23/16. Parents are aware of lactation services and support group. They will call as needed.    Maternal Data    Feeding Feeding Type: Breast Fed Length of feed: 25 min  LATCH Score/Interventions                      Lactation Tools Discussed/Used     Consult Status Consult Status: Complete Follow-up type: Out-patient    Denzil Hughes 01/21/2016, 11:58 AM

## 2016-03-28 ENCOUNTER — Ambulatory Visit (INDEPENDENT_AMBULATORY_CARE_PROVIDER_SITE_OTHER): Payer: BC Managed Care – PPO | Admitting: Physician Assistant

## 2016-03-28 VITALS — BP 123/83 | HR 71 | Temp 98.1°F | Resp 18 | Ht 66.0 in | Wt 285.6 lb

## 2016-03-28 DIAGNOSIS — M654 Radial styloid tenosynovitis [de Quervain]: Secondary | ICD-10-CM | POA: Diagnosis not present

## 2016-03-28 NOTE — Progress Notes (Signed)
  03/28/2016 9:20 AM   DOB: 1980/07/16 / MRN: [id number]  SUBJECTIVE:  Kendra Nguyen is a 36 y.o. female presenting for left lateral wrist pain.  She is a new mother to a 46 month old.  Picking up the infant makes the pain worse.  She has tried Iburpofen and a wrist brace and says the pain is getting worse.  She saw urgent care 2 weeks ago and they told her this was tenosynovitis and to wear a brace and take ibuprofen.  She denies weakness, numbness, neck and elbow pain.   She is allergic to tape and neomycin.   She  has a past medical history of laparoscopic gastric banding; Hyperlipemia; Hypertension; Morbid obesity with BMI of 45.0-49.9, adult ([place]); and Varicose veins.    She  reports that she has never smoked. She has never used smokeless tobacco. She reports that she does not drink alcohol or use drugs. She  reports that she currently engages in sexual activity. She reports using the following method of birth control/protection: None. The patient  has a past surgical history that includes Laparoscopic gastric banding (04/17/2011); Laparoscopic gastric sleeve resection; Colonoscopy with propofol (N/A, 11/24/2014); Wisdom tooth extraction; and Cesarean section (N/A, 01/18/2016).  Her family history includes Aneurysm (age of onset: 59) in her sister; Arthritis in her father; COPD in her father and paternal aunt; Cancer in her father; Diabetes in her mother; Heart disease in her mother; Hyperlipidemia in her father.  Review of Systems  Constitutional: Negative for chills and fever.  Neurological: Negative for dizziness and focal weakness.    The problem list and medications were reviewed and updated by myself where necessary and exist elsewhere in the encounter.   OBJECTIVE:  BP 123/83 (BP Location: Right Arm, Patient Position: Sitting, Cuff Size: Large)   Pulse 71   Temp 98.1 F (36.7 C) (Oral)   Resp 18   Ht 5\' 6"  (1.676 m)   Wt 285 lb 9.6 oz (129.5 kg)   LMP 03/23/2016 (Approximate)    SpO2 100%   BMI 46.10 kg/m   Physical Exam  Constitutional: Vital signs are normal.  Cardiovascular: Normal rate.   Pulmonary/Chest: Effort normal.  Musculoskeletal: Normal range of motion. She exhibits tenderness.       Left hand: She exhibits tenderness. She exhibits no bony tenderness.       Hands:   No results found for this or any previous visit (from the past 72 hour(s)).  No results found.  ASSESSMENT AND PLAN:  Kendra Nguyen was seen today for wrist pain.  Diagnoses and all orders for this visit:  De Quervain's tenosynovitis, left: She is not improving with conservative management.  Advise she try to reduce the stress on the radiocarpal joint as best as possible, continue the brace, and I will refer her to hand who will likely consider a steroid injection.  -     Ambulatory referral to Hand Surgery    The patient is advised to call or return to clinic if she does not see an improvement in symptoms, or to seek the care of the closest emergency department if she worsens with the above plan.   Philis Fendt, MHS, PA-C [facility] 03/28/2016 9:20 AM

## 2016-03-28 NOTE — Patient Instructions (Signed)
     IF you received an x-ray today, you will receive an invoice from [facility]. Please contact [facility] at [phone] with questions or concerns regarding your invoice.   IF you received labwork today, you will receive an invoice from [facility]. Please contact [facility] at [phone] with questions or concerns regarding your invoice.   Our billing staff will not be able to assist you with questions regarding bills from these companies.  You will be contacted with the lab results as soon as they are available. The fastest way to get your results is to activate your My Chart account. Instructions are located on the last page of this paperwork. If you have not heard from us regarding the results in 2 weeks, please contact this office.     

## 2016-04-09 ENCOUNTER — Ambulatory Visit (INDEPENDENT_AMBULATORY_CARE_PROVIDER_SITE_OTHER): Payer: BC Managed Care – PPO | Admitting: Orthopaedic Surgery

## 2016-04-09 ENCOUNTER — Encounter (INDEPENDENT_AMBULATORY_CARE_PROVIDER_SITE_OTHER): Payer: Self-pay | Admitting: Orthopaedic Surgery

## 2016-04-09 DIAGNOSIS — M654 Radial styloid tenosynovitis [de Quervain]: Secondary | ICD-10-CM | POA: Diagnosis not present

## 2016-04-09 MED ORDER — METHYLPREDNISOLONE ACETATE 40 MG/ML IJ SUSP
13.3300 mg | INTRAMUSCULAR | Status: AC | PRN
  Administered 2016-04-09: 13.33 mg

## 2016-04-09 MED ORDER — LIDOCAINE HCL 1 % IJ SOLN
0.3000 mL | INTRAMUSCULAR | Status: AC | PRN
  Administered 2016-04-09: .3 mL

## 2016-04-09 MED ORDER — BUPIVACAINE HCL 0.5 % IJ SOLN
0.3300 mL | INTRAMUSCULAR | Status: AC | PRN
  Administered 2016-04-09: .33 mL

## 2016-04-09 NOTE — Progress Notes (Signed)
Office Visit Note   Patient: Kendra Nguyen           Date of Birth: 31-Mar-1980           MRN: [id number] Visit Date: 04/09/2016              Requested by: Tereasa Coop, PA-C [place], [place] [address] PCP: Ivar Drape, PA   Assessment & Plan: Visit Diagnoses:  1. Tenosynovitis, de Quervain     Plan: My impression is left wrist de Quervain's tenosynovitis. Injection was performed today under sterile conditions. Patient patient tolerated well. We'll see her back as needed. I instructed her on avoiding repetitive activities  Follow-Up Instructions: Return if symptoms worsen or fail to improve.   Orders:  No orders of the defined types were placed in this encounter.  No orders of the defined types were placed in this encounter.     Procedures: Hand/UE Inj Date/Time: 04/09/2016 9:37 AM Performed by: Leandrew Koyanagi Authorized by: Leandrew Koyanagi   Consent Given by:  Patient Timeout: prior to procedure the correct patient, procedure, and site was verified   Indications:  Pain Condition: de Quervain's   Site:  L extensor compartment 1 Prep: patient was prepped and draped in usual sterile fashion   Needle Size:  25 G Approach:  Radial Medications:  0.3 mL lidocaine 1 %; 0.33 mL bupivacaine 0.5 %; 13.33 mg methylPREDNISolone acetate 40 MG/ML     Clinical Data: No additional findings.   Subjective: Chief Complaint  Patient presents with  . Left Thumb - Pain    Patient comes in with 2-3 month history of left radial wrist pain that radiates along the thumb and up the forearm. This is worse with activity and therapy Her baby. She did recently have a baby in December. She has not taken any medicines because she is breast-feeding. She denies any injuries.    Review of Systems  Constitutional: Negative.   HENT: Negative.   Eyes: Negative.   Respiratory: Negative.   Cardiovascular: Negative.   Endocrine: Negative.   Musculoskeletal: Negative.     Neurological: Negative.   Hematological: Negative.   Psychiatric/Behavioral: Negative.   All other systems reviewed and are negative.    Objective: Vital Signs: LMP 03/23/2016 (Approximate)   Physical Exam  Constitutional: She is oriented to person, place, and time. She appears well-developed and well-nourished.  HENT:  Head: Normocephalic and atraumatic.  Eyes: EOM are normal.  Neck: Neck supple.  Pulmonary/Chest: Effort normal.  Abdominal: Soft.  Neurological: She is alert and oriented to person, place, and time.  Skin: Skin is warm. Capillary refill takes less than 2 seconds.  Psychiatric: She has a normal mood and affect. Her behavior is normal. Judgment and thought content normal.  Nursing note and vitals reviewed.   Ortho Exam Exam of the left wrist shows tenderness over the radial styloid of the first dorsal wrist compartment. Positive Finkelstein's. No other findings on exam. Specialty Comments:  No specialty comments available.  Imaging: No results found.   PMFS History: Patient Active Problem List   Diagnosis Date Noted  . Tenosynovitis, de Quervain 04/09/2016  . Pre-eclampsia affecting pregnancy, antepartum 01/17/2016  . Preeclampsia, third trimester 01/17/2016  . S/P gastric bypass 12/18/2014  . 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 Pure hypercholesterolemia 12/15/2013  . Vitamin D deficiency 12/15/2013  . Morbid obesity ([place]) 03/21/2011   Past Medical History:  Diagnosis Date  . Hx of laparoscopic gastric  banding   . Hyperlipemia   . Hypertension   . Morbid obesity with BMI of 45.0-49.9, adult ([place])   . Varicose veins     Family History  Problem Relation Age of Onset  . Diabetes Mother   . Heart disease Mother   . Arthritis Father     gout  . COPD Father     smoker  . Hyperlipidemia Father   . Cancer Father   . Aneurysm Sister 34    cerebral;   . COPD Paternal 5   . Neuropathy Neg Hx     Past Surgical  History:  Procedure Laterality Date  . CESAREAN SECTION N/A 01/18/2016   Procedure: CESAREAN SECTION;  Surgeon: Everett Graff, MD;  Location: [facility];  Service: Obstetrics;  Laterality: N/A;  . COLONOSCOPY WITH PROPOFOL N/A 11/24/2014   Procedure: COLONOSCOPY WITH PROPOFOL;  Surgeon: Milus Banister, MD;  [facility];  Service: Endoscopy;  Laterality: N/A;  . LAPAROSCOPIC GASTRIC BANDING  04/17/2011  . [place] RESECTION     2016  . WISDOM TOOTH EXTRACTION     Social History   Occupational History  . Teacher- middle school    Social History Main Topics  . Smoking status: Never Smoker  . Smokeless tobacco: Never Used  . Alcohol use No  . Drug use: No  . Sexual activity: Yes    Birth control/ protection: None

## 2016-04-21 DIAGNOSIS — F429 Obsessive-compulsive disorder, unspecified: Secondary | ICD-10-CM

## 2016-04-21 HISTORY — DX: Obsessive-compulsive disorder, unspecified: F42.9

## 2016-05-16 ENCOUNTER — Ambulatory Visit: Payer: BC Managed Care – PPO

## 2016-05-17 ENCOUNTER — Ambulatory Visit (INDEPENDENT_AMBULATORY_CARE_PROVIDER_SITE_OTHER): Payer: BC Managed Care – PPO | Admitting: Physician Assistant

## 2016-05-17 ENCOUNTER — Encounter: Payer: Self-pay | Admitting: Physician Assistant

## 2016-05-17 VITALS — BP 138/91 | HR 79 | Temp 98.4°F | Resp 18 | Ht 65.55 in | Wt 300.0 lb

## 2016-05-17 DIAGNOSIS — L089 Local infection of the skin and subcutaneous tissue, unspecified: Secondary | ICD-10-CM

## 2016-05-17 DIAGNOSIS — L732 Hidradenitis suppurativa: Secondary | ICD-10-CM | POA: Diagnosis not present

## 2016-05-17 DIAGNOSIS — B958 Unspecified staphylococcus as the cause of diseases classified elsewhere: Secondary | ICD-10-CM

## 2016-05-17 LAB — POCT URINE PREGNANCY: Preg Test, Ur: NEGATIVE

## 2016-05-17 MED ORDER — DOXYCYCLINE HYCLATE 100 MG PO CAPS: 100.0000 mg | ORAL_CAPSULE | Freq: Two times a day (BID) | ORAL | 6 refills | Status: AC

## 2016-05-17 MED ORDER — CHLORHEXIDINE GLUCONATE 4 % EX LIQD: Freq: Every day | CUTANEOUS | 0 refills | Status: DC | PRN

## 2016-05-17 NOTE — Patient Instructions (Addendum)
[email or web address]     IF you received an x-ray today, you will receive an invoice from [facility]. Please contact [facility] at [phone] with questions or concerns regarding your invoice.   IF you received labwork today, you will receive an invoice from [place] [facility]. Please contact [facility] at [phone] with questions or concerns regarding your invoice.   Our billing staff will not be able to assist you with questions regarding bills from these companies.  You will be contacted with the lab results as soon as they are available. The fastest way to get your results is to activate your My Chart account. Instructions are located on the last page of this paperwork. If you have not heard from [place] regarding the results in 2 weeks, please contact this office.

## 2016-05-17 NOTE — Progress Notes (Signed)
05/17/2016 4:42 PM   DOB: 1980-12-22 / MRN: [id number]  SUBJECTIVE:  Kendra Nguyen is a 36 y.o. female presenting for "pus filled bumps" that have been occurring "all over her body."  This started about 1 month ago and seemed to coincide with the cessation of nursing.  Tells me the pustules are not painful.  She thinks she is getting worse overall.    Tells me her tenosynovitis is in remission with IM corticosteroid.   She is allergic to tape and neomycin.   She  has a past medical history of laparoscopic gastric banding; Hyperlipemia; Hypertension; Morbid obesity with BMI of 45.0-49.9, adult ([place]); and Varicose veins.    She  reports that she has never smoked. She has never used smokeless tobacco. She reports that she does not drink alcohol or use drugs. She  reports that she currently engages in sexual activity. She reports using the following method of birth control/protection: None. The patient  has a past surgical history that includes Laparoscopic gastric banding (04/17/2011); Laparoscopic gastric sleeve resection; Colonoscopy with propofol (N/A, 11/24/2014); Wisdom tooth extraction; and Cesarean section (N/A, 01/18/2016).  Her family history includes Aneurysm (age of onset: 70) in her sister; Arthritis in her father; COPD in her father and paternal aunt; Cancer in her father; Diabetes in her mother; Heart disease in her mother; Hyperlipidemia in her father.  Review of Systems  Constitutional: Negative for chills, diaphoresis and fever.  Gastrointestinal: Negative for nausea.  Skin: Positive for rash. Negative for itching.  Neurological: Negative for dizziness.    The problem list and medications were reviewed and updated by myself where necessary and exist elsewhere in the encounter.   OBJECTIVE:  BP (!) 138/91   Pulse 79   Temp 98.4 F (36.9 C) (Oral)   Resp 18   Ht 5' 5.55" (1.665 m)   Wt 300 lb (136.1 kg)   SpO2 96%   Breastfeeding? No   BMI 49.09 kg/m   Physical  Exam  Constitutional: She is active.  Non-toxic appearance.  Cardiovascular: Normal rate.   Pulmonary/Chest: Effort normal. No tachypnea.  Neurological: She is alert.  Skin: Skin is warm and dry. She is not diaphoretic. No pallor.       Lab Results  Component Value Date   HGBA1C 5.4 12/18/2014    Results for orders placed or performed in visit on 05/17/16 (from the past 72 hour(s))  POCT urine pregnancy     Status: None   Collection Time: 05/17/16  4:13 PM  Result Value Ref Range   Preg Test, Ur Negative Negative    No results found.  ASSESSMENT AND PLAN:  [name] was seen today for puss filled bumps.  Diagnoses and all orders for this visit:  Staph skin infection: She is taking birth control without fail.  She is not breast feeding.  Given problem two I am giving her refills on doxy that she can take and finish if problem two flares.  -     doxycycline (VIBRAMYCIN) 100 MG capsule; Take 1 capsule (100 mg total) by mouth 2 (two) times daily. Do not miss doses.  Do not take if you are off your birth control. -     POCT urine pregnancy  Hydradenitis -     doxycycline (VIBRAMYCIN) 100 MG capsule; Take 1 capsule (100 mg total) by mouth 2 (two) times daily. Do not miss doses.  Do not take if you are off your birth control. -     POCT urine  pregnancy    The patient is advised to call or return to clinic if she does not see an improvement in symptoms, or to seek the care of the closest emergency department if she worsens with the above plan.   Philis Fendt, MHS, PA-C [facility] 05/17/2016 4:42 PM

## 2016-05-24 ENCOUNTER — Ambulatory Visit: Payer: BC Managed Care – PPO | Admitting: Physician Assistant

## 2016-06-13 ENCOUNTER — Ambulatory Visit (INDEPENDENT_AMBULATORY_CARE_PROVIDER_SITE_OTHER): Payer: BC Managed Care – PPO | Admitting: Physician Assistant

## 2016-06-13 ENCOUNTER — Encounter: Payer: Self-pay | Admitting: Physician Assistant

## 2016-06-13 VITALS — BP 143/90 | HR 84 | Temp 98.7°F | Resp 18 | Ht 65.55 in | Wt 303.6 lb

## 2016-06-13 DIAGNOSIS — R21 Rash and other nonspecific skin eruption: Secondary | ICD-10-CM | POA: Diagnosis not present

## 2016-06-13 LAB — POCT URINE PREGNANCY: Preg Test, Ur: NEGATIVE

## 2016-06-13 MED ORDER — TRIAMCINOLONE ACETONIDE 0.1 % EX CREA: 1.0000 "application " | TOPICAL_CREAM | Freq: Two times a day (BID) | CUTANEOUS | 0 refills | Status: DC

## 2016-06-13 NOTE — Patient Instructions (Addendum)
  Please do not apply longer than 2 weeks.  You will let me know if there is no improvement within 7 days. You can moisturize these areas as well, with hypoallergenic moisturizers.     IF you received an x-ray today, you will receive an invoice from [facility]. Please contact [facility] at [phone] with questions or concerns regarding your invoice.   IF you received labwork today, you will receive an invoice from [place]. Please contact [facility] at [phone] with questions or concerns regarding your invoice.   Our billing staff will not be able to assist you with questions regarding bills from these companies.  You will be contacted with the lab results as soon as they are available. The fastest way to get your results is to activate your My Chart account. Instructions are located on the last page of this paperwork. If you have not heard from [place] regarding the results in 2 weeks, please contact this office.

## 2016-06-13 NOTE — Progress Notes (Signed)
PRIMARY CARE AT [place], [place] [address] [phone]  Date:  06/13/2016   Name:  Kendra Nguyen   DOB:  December 14, 1980   MRN:  [id number]  PCP:  Joretta Bachelor, PA    History of Present Illness:  Kendra Nguyen is a 36 y.o. female patient who presents to PCP with  Chief Complaint  Patient presents with  . Rash    out break on both arms and legs x 5 days      Rash on the back of arms and legs.  Pruritic rash for 5 days.  First 2 days were intolerable, but now they  She was in [place], and outdoors at a cook out.    Patient Active Problem List   Diagnosis Date Noted  . Tenosynovitis, de Quervain 04/09/2016  . Pre-eclampsia affecting pregnancy, antepartum 01/17/2016  . Preeclampsia, third trimester 01/17/2016  . S/P gastric bypass 12/18/2014  . 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 Pure hypercholesterolemia 12/15/2013  . Vitamin D deficiency 12/15/2013  . Morbid obesity ([place]) 03/21/2011    Past Medical History:  Diagnosis Date  . Hx of laparoscopic gastric banding   . Hyperlipemia   . Hypertension   . Morbid obesity with BMI of 45.0-49.9, adult ([place])   . Varicose veins     Past Surgical History:  Procedure Laterality Date  . CESAREAN SECTION N/A 01/18/2016   Procedure: CESAREAN SECTION;  Surgeon: Everett Graff, MD;  Location: [facility];  Service: Obstetrics;  Laterality: N/A;  . COLONOSCOPY WITH PROPOFOL N/A 11/24/2014   Procedure: COLONOSCOPY WITH PROPOFOL;  Surgeon: Milus Banister, MD;  [facility];  Service: Endoscopy;  Laterality: N/A;  . LAPAROSCOPIC GASTRIC BANDING  04/17/2011  . [place] RESECTION     2016  . WISDOM TOOTH EXTRACTION      Social History  Substance Use Topics  . Smoking status: Never Smoker  . Smokeless tobacco: Never Used  . Alcohol use No    Family History  Problem Relation Age of Onset  . Diabetes Mother   . Heart disease Mother   .  Arthritis Father        gout  . COPD Father        smoker  . Hyperlipidemia Father   . Cancer Father   . Aneurysm Sister 34       cerebral;   . COPD Paternal 33   . Neuropathy Neg Hx     Allergies  Allergen Reactions  . Tape     rash  . Neomycin Rash    Medication list has been reviewed and updated.  Current Outpatient Prescriptions on File Prior to Visit  Medication Sig Dispense Refill  . norethindrone (MICRONOR,CAMILA,ERRIN) 0.35 MG tablet Take 1 tablet by mouth daily.    . Prenatal MV-Min-FA-Omega-3 (PRENATAL GUMMIES/DHA & FA) 0.4-32.5 MG CHEW Chew 2 tablets by mouth daily.    . chlorhexidine (HIBICLENS) 4 % external liquid Apply topically daily as needed. (Patient not taking: Reported on 06/13/2016) 120 mL 0   No current facility-administered medications on file prior to visit.     ROS ROS otherwise unremarkable unless listed above.  Physical Examination: BP (!) 143/90   Pulse 84   Temp 98.7 F (37.1 C) (Oral)   Resp 18   Ht 5' 5.55" (1.665 m)   Wt (!) 303 lb 9.6 oz (137.7 kg)   LMP 06/09/2016   SpO2 99%   BMI 49.68 kg/m  Ideal Body Weight: Weight in (lb) to have BMI = 25: 152.5  Physical Exam  Constitutional: She is oriented to person, place, and time. She appears well-developed and well-nourished. No distress.  HENT:  Head: Normocephalic and atraumatic.  Right Ear: External ear normal.  Left Ear: External ear normal.  Eyes: Conjunctivae and EOM are normal. Pupils are equal, round, and reactive to light.  Cardiovascular: Normal rate.   Pulmonary/Chest: Effort normal. No respiratory distress.  Neurological: She is alert and oriented to person, place, and time.  Skin: She is not diaphoretic.  Posterior upper forearms with papular rash. no umbilicated centers.    Psychiatric: She has a normal mood and affect. Her behavior is normal.     Assessment and Plan: Kendra Nguyen is a 36 y.o. female who is here today for cc of rash. Rash and nonspecific  skin eruption - Plan: POCT urine pregnancy, triamcinolone cream (KENALOG) 0.1 %  Ivar Drape, PA-C [facility] 5/27/20189:10 AM

## 2016-06-28 ENCOUNTER — Ambulatory Visit (INDEPENDENT_AMBULATORY_CARE_PROVIDER_SITE_OTHER): Payer: BC Managed Care – PPO | Admitting: Physician Assistant

## 2016-06-28 VITALS — BP 151/87 | HR 70 | Temp 98.5°F | Ht 65.5 in | Wt 308.0 lb

## 2016-06-28 DIAGNOSIS — R21 Rash and other nonspecific skin eruption: Secondary | ICD-10-CM

## 2016-06-28 LAB — POCT SKIN KOH: SKIN KOH, POC: NEGATIVE

## 2016-06-28 MED ORDER — MUPIROCIN 2 % EX OINT: 1.0000 "application " | TOPICAL_OINTMENT | Freq: Two times a day (BID) | CUTANEOUS | 1 refills | Status: AC

## 2016-06-28 NOTE — Patient Instructions (Signed)
Please apply the mupirocin ointment to the nares twice per day for 5 days.  You may use the chlorhexidine for lesions that pop up. Make sure that there are no insects or fleas in the vicinity of the home or work place.  Try the mupirocin ointment on the skin first to make sure you have no allergy.

## 2016-06-29 NOTE — Progress Notes (Signed)
PRIMARY CARE AT [place], [place] [address] [phone]  Date:  06/28/2016   Name:  Kendra Nguyen   DOB:  May 17, 1980   MRN:  [id number]  PCP:  Joretta Bachelor, PA    History of Present Illness:  Kendra Nguyen is a 36 y.o. female patient who presents to PCP with cc of rash. Patient reports that the lesions seemed to improve with abx oral medication.  She has not yet used the chlorhexidine on the lesions.   They are mildly pruritic.     Patient Active Problem List   Diagnosis Date Noted  . Tenosynovitis, de Quervain 04/09/2016  . Pre-eclampsia affecting pregnancy, antepartum 01/17/2016  . Preeclampsia, third trimester 01/17/2016  . S/P gastric bypass 12/18/2014  . 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 Pure hypercholesterolemia 12/15/2013  . Vitamin D deficiency 12/15/2013  . Morbid obesity ([place]) 03/21/2011    Past Medical History:  Diagnosis Date  . Hx of laparoscopic gastric banding   . Hyperlipemia   . Hypertension   . Morbid obesity with BMI of 45.0-49.9, adult ([place])   . Varicose veins     Past Surgical History:  Procedure Laterality Date  . CESAREAN SECTION N/A 01/18/2016   Procedure: CESAREAN SECTION;  Surgeon: Everett Graff, MD;  Location: [facility];  Service: Obstetrics;  Laterality: N/A;  . COLONOSCOPY WITH PROPOFOL N/A 11/24/2014   Procedure: COLONOSCOPY WITH PROPOFOL;  Surgeon: Milus Banister, MD;  [facility];  Service: Endoscopy;  Laterality: N/A;  . LAPAROSCOPIC GASTRIC BANDING  04/17/2011  . [place] RESECTION     2016  . WISDOM TOOTH EXTRACTION      Social History  Substance Use Topics  . Smoking status: Never Smoker  . Smokeless tobacco: Never Used  . Alcohol use No    Family History  Problem Relation Age of Onset  . Diabetes Mother   . Heart disease Mother   . Arthritis Father        gout  . COPD Father        smoker  . Hyperlipidemia Father   .  Cancer Father   . Aneurysm Sister 34       cerebral;   . COPD Paternal 54   . Neuropathy Neg Hx     Allergies  Allergen Reactions  . Tape     rash  . Neomycin Rash    Medication list has been reviewed and updated.  Current Outpatient Prescriptions on File Prior to Visit  Medication Sig Dispense Refill  . chlorhexidine (HIBICLENS) 4 % external liquid Apply topically daily as needed. (Patient not taking: Reported on 06/13/2016) 120 mL 0  . norethindrone (MICRONOR,CAMILA,ERRIN) 0.35 MG tablet Take 1 tablet by mouth daily.    . Prenatal MV-Min-FA-Omega-3 (PRENATAL GUMMIES/DHA & FA) 0.4-32.5 MG CHEW Chew 2 tablets by mouth daily.    Marland Kitchen triamcinolone cream (KENALOG) 0.1 % Apply 1 application topically 2 (two) times daily. 30 g 0  . Vilazodone HCl (VIIBRYD) 40 MG TABS Take 40 mg by mouth daily.     No current facility-administered medications on file prior to visit.     ROS ROS otherwise unremarkable unless listed above.  Physical Examination: BP (!) 151/87   Pulse 70   Temp 98.5 F (36.9 C)   Ht 5' 5.5" (1.664 m)   Wt (!) 308 lb (139.7 kg)   LMP 06/09/2016   SpO2 100%   BMI 50.47 kg/m  Ideal Body  Weight: Weight in (lb) to have BMI = 25: 152.2  Physical Exam  Constitutional: She is oriented to person, place, and time. She appears well-developed and well-nourished. No distress.  HENT:  Head: Normocephalic and atraumatic.  Right Ear: External ear normal.  Left Ear: External ear normal.  Eyes: Conjunctivae and EOM are normal. Pupils are equal, round, and reactive to light.  Cardiovascular: Normal rate.   Pulmonary/Chest: Effort normal. No respiratory distress.  Neurological: She is alert and oriented to person, place, and time.  Skin: She is not diaphoretic.  Scant hyperpigmented less than .5cm nonraised lesions.   Hypopigmented circular patch at the right axilla.  Psychiatric: She has a normal mood and affect. Her behavior is normal.   Axillary patch: Results for  orders placed or performed in visit on 06/28/16  POCT Skin KOH  Result Value Ref Range   Skin KOH, POC Negative Negative     Assessment and Plan: Kendra Nguyen is a 36 y.o. female who is here today for cc of rash. Advised to use the chlorhexidine in these areas, and mupirocin nares for decolonization of skin  Rash and nonspecific skin eruption - Plan: POCT Skin KOH, CANCELED: POCT Wet + KOH Prep  Ivar Drape, PA-C [facility] 6/10/20188:10 AM

## 2016-07-01 ENCOUNTER — Encounter: Payer: BC Managed Care – PPO | Admitting: Physician Assistant

## 2016-07-22 ENCOUNTER — Ambulatory Visit (INDEPENDENT_AMBULATORY_CARE_PROVIDER_SITE_OTHER): Payer: BC Managed Care – PPO | Admitting: Physician Assistant

## 2016-07-22 ENCOUNTER — Encounter: Payer: Self-pay | Admitting: Physician Assistant

## 2016-07-22 VITALS — BP 127/67 | HR 83 | Temp 98.6°F | Resp 16 | Ht 65.5 in | Wt 312.0 lb

## 2016-07-22 DIAGNOSIS — L309 Dermatitis, unspecified: Secondary | ICD-10-CM | POA: Diagnosis not present

## 2016-07-22 DIAGNOSIS — L282 Other prurigo: Secondary | ICD-10-CM | POA: Diagnosis not present

## 2016-07-22 MED ORDER — RANITIDINE HCL 150 MG PO TABS: 150.0000 mg | ORAL_TABLET | Freq: Two times a day (BID) | ORAL | 0 refills | Status: DC

## 2016-07-22 MED ORDER — CETIRIZINE HCL 10 MG PO TABS: 10.0000 mg | ORAL_TABLET | Freq: Every day | ORAL | 1 refills | Status: DC

## 2016-07-22 NOTE — Patient Instructions (Addendum)
I am placing a referral for dermatology.  Please await that call.   Please take this prescription in the interim.  I want to see if this might help.  You may stop after 7-10 days if no improvement.      IF you received an x-ray today, you will receive an invoice from [facility]. Please contact [facility]iology at [phone] with questions or concerns regarding your invoice.   IF you received labwork today, you will receive an invoice from [facility]. Please contact [facility] at [phone] with questions or concerns regarding your invoice.   Our billing staff will not be able to assist you with questions regarding bills from these companies.  You will be contacted with the lab results as soon as they are available. The fastest way to get your results is to activate your My Chart account. Instructions are located on the last page of this paperwork. If you have not heard from [place] regarding the results in 2 weeks, please contact this office.

## 2016-07-26 NOTE — Progress Notes (Signed)
PRIMARY CARE AT [place], [place] [address] [phone]  Date:  07/22/2016   Name:  Kendra Nguyen   DOB:  1980-11-22   MRN:  [id number]  PCP:  Joretta Bachelor, PA    History of Present Illness:  Kendra Nguyen is a 36 y.o. female patient who presents to PCP with  Chief Complaint  Patient presents with  . Follow-up    states that rash has not gotten better       patient with pruritic rash that has been prominent for about 2 months.  This is along the harms moreso on the back.  She was first treated with doxycycline which was thought to help.   There has been no leakage to the areas.  She has attempted chlorhexidine which did not help as well.   Nothing on her palms or soles.    Patient Active Problem List   Diagnosis Date Noted  . Tenosynovitis, de Quervain 04/09/2016  . Pre-eclampsia affecting pregnancy, antepartum 01/17/2016  . Preeclampsia, third trimester 01/17/2016  . S/P gastric bypass 12/18/2014  . 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 Pure hypercholesterolemia 12/15/2013  . Vitamin D deficiency 12/15/2013  . Morbid obesity ([place]) 03/21/2011    Past Medical History:  Diagnosis Date  . Hx of laparoscopic gastric banding   . Hyperlipemia   . Hypertension   . Morbid obesity with BMI of 45.0-49.9, adult ([place])   . Varicose veins     Past Surgical History:  Procedure Laterality Date  . CESAREAN SECTION N/A 01/18/2016   Procedure: CESAREAN SECTION;  Surgeon: Everett Graff, MD;  Location: [facility];  Service: Obstetrics;  Laterality: N/A;  . COLONOSCOPY WITH PROPOFOL N/A 11/24/2014   Procedure: COLONOSCOPY WITH PROPOFOL;  Surgeon: Milus Banister, MD;  Location: [facility];  Service: Endoscopy;  Laterality: N/A;  . LAPAROSCOPIC GASTRIC BANDING  04/17/2011  . [place] RESECTION     2016  . WISDOM TOOTH EXTRACTION      Social History  Substance Use Topics  . Smoking status: Never Smoker   . Smokeless tobacco: Never Used  . Alcohol use No    Family History  Problem Relation Age of Onset  . Diabetes Mother   . Heart disease Mother   . Arthritis Father        gout  . COPD Father        smoker  . Hyperlipidemia Father   . Cancer Father   . Aneurysm Sister 34       cerebral;   . COPD Paternal 96   . Neuropathy Neg Hx     Allergies  Allergen Reactions  . Tape     rash  . Neomycin Rash    Medication list has been reviewed and updated.  Current Outpatient Prescriptions on File Prior to Visit  Medication Sig Dispense Refill  . chlorhexidine (HIBICLENS) 4 % external liquid Apply topically daily as needed. 120 mL 0  . norethindrone (MICRONOR,CAMILA,ERRIN) 0.35 MG tablet Take 1 tablet by mouth daily.    . Prenatal MV-Min-FA-Omega-3 (PRENATAL GUMMIES/DHA & FA) 0.4-32.5 MG CHEW Chew 2 tablets by mouth daily.    Marland Kitchen triamcinolone cream (KENALOG) 0.1 % Apply 1 application topically 2 (two) times daily. 30 g 0  . Vilazodone HCl (VIIBRYD) 40 MG TABS Take 40 mg by mouth daily.     No current facility-administered medications on file prior to visit.     ROS ROS otherwise unremarkable unless  listed above.  Physical Examination: BP 127/67   Pulse 83   Temp 98.6 F (37 C) (Oral)   Resp 16   Ht 5' 5.5" (1.664 m)   Wt (!) 312 lb (141.5 kg)   SpO2 98%   BMI 51.13 kg/m  Ideal Body Weight: Weight in (lb) to have BMI = 25: 152.2  Physical Exam  Constitutional: She is oriented to person, place, and time. She appears well-developed and well-nourished. No distress.  HENT:  Head: Normocephalic and atraumatic.  Right Ear: External ear normal.  Left Ear: External ear normal.  Eyes: Conjunctivae and EOM are normal. Pupils are equal, round, and reactive to light.  Cardiovascular: Normal rate.   Pulmonary/Chest: Effort normal. No respiratory distress.  Neurological: She is alert and oriented to person, place, and time.  Skin: Skin is warm and dry. She is not diaphoretic.   Scant papular rash  Along the upper extremities and hands.     Psychiatric: She has a normal mood and affect. Her behavior is normal.     Assessment and Plan: Kendra Nguyen is a 36 y.o. female who is here today for rash. This continues after 2 months.   Consult with dermatology appreciated at this time.   Given h2 blocker and zyrtec at this time, to insure this is not an inflammatory/allergic condition. Dermatitis - Plan: ranitidine (ZANTAC) 150 MG tablet, cetirizine (ZYRTEC) 10 MG tablet, Ambulatory referral to Dermatology  Pruritic rash - Plan: Ambulatory referral to Dermatology  Ivar Drape, PA-C [facility] 7/6/20188:06 AM

## 2016-08-03 ENCOUNTER — Encounter: Payer: Self-pay | Admitting: Emergency Medicine

## 2016-08-03 ENCOUNTER — Ambulatory Visit (INDEPENDENT_AMBULATORY_CARE_PROVIDER_SITE_OTHER): Payer: BC Managed Care – PPO | Admitting: Emergency Medicine

## 2016-08-03 VITALS — BP 138/85 | HR 88 | Temp 99.1°F | Resp 16 | Ht 66.0 in | Wt 310.6 lb

## 2016-08-03 DIAGNOSIS — R059 Cough, unspecified: Secondary | ICD-10-CM

## 2016-08-03 DIAGNOSIS — H6692 Otitis media, unspecified, left ear: Secondary | ICD-10-CM

## 2016-08-03 DIAGNOSIS — J069 Acute upper respiratory infection, unspecified: Secondary | ICD-10-CM

## 2016-08-03 DIAGNOSIS — R05 Cough: Secondary | ICD-10-CM | POA: Diagnosis not present

## 2016-08-03 MED ORDER — AZITHROMYCIN 250 MG PO TABS: ORAL_TABLET | ORAL | 0 refills | Status: DC

## 2016-08-03 MED ORDER — PROMETHAZINE-CODEINE 6.25-10 MG/5ML PO SYRP: 5.0000 mL | ORAL_SOLUTION | Freq: Every evening | ORAL | 0 refills | Status: DC | PRN

## 2016-08-03 NOTE — Patient Instructions (Addendum)
   IF you received an x-ray today, you will receive an invoice from [facility]. Please contact [facility] at [phone] with questions or concerns regarding your invoice.   IF you received labwork today, you will receive an invoice from [facility]. Please contact [facility] at [phone] with questions or concerns regarding your invoice.   Our billing staff will not be able to assist you with questions regarding bills from these companies.  You will be contacted with the lab results as soon as they are available. The fastest way to get your results is to activate your My Chart account. Instructions are located on the last page of this paperwork. If you have not heard from us regarding the results in 2 weeks, please contact this office.     Otitis Media, Adult Otitis media is redness, soreness, and puffiness (swelling) in the space just behind your eardrum (middle ear). It may be caused by allergies or infection. It often happens along with a cold. Follow these instructions at home:  Take your medicine as told. Finish it even if you start to feel better.  Only take over-the-counter or prescription medicines for pain, discomfort, or fever as told by your doctor.  Follow up with your doctor as told. Contact a doctor if:  You have otitis media only in one ear, or bleeding from your nose, or both.  You notice a lump on your neck.  You are not getting better in 3-5 days.  You feel worse instead of better. Get help right away if:  You have pain that is not helped with medicine.  You have puffiness, redness, or pain around your ear.  You get a stiff neck.  You cannot move part of your face (paralysis).  You notice that the bone behind your ear hurts when you touch it. This information is not intended to replace advice given to you by your health care provider. Make sure you discuss any questions you have with your health care provider. Document Released:  06/26/2007 Document Revised: 06/15/2015 Document Reviewed: 08/04/2012 Elsevier Interactive Patient Education  2017 Elsevier Inc.  Upper Respiratory Infection, Adult Most upper respiratory infections (URIs) are caused by a virus. A URI affects the nose, throat, and upper air passages. The most common type of URI is often called "the common cold." Follow these instructions at home:  Take medicines only as told by your doctor.  Gargle warm saltwater or take cough drops to comfort your throat as told by your doctor.  Use a warm mist humidifier or inhale steam from a shower to increase air moisture. This may make it easier to breathe.  Drink enough fluid to keep your pee (urine) clear or pale yellow.  Eat soups and other clear broths.  Have a healthy diet.  Rest as needed.  Go back to work when your fever is gone or your doctor says it is okay. ? You may need to stay home longer to avoid giving your URI to others. ? You can also wear a face mask and wash your hands often to prevent spread of the virus.  Use your inhaler more if you have asthma.  Do not use any tobacco products, including cigarettes, chewing tobacco, or electronic cigarettes. If you need help quitting, ask your doctor. Contact a doctor if:  You are getting worse, not better.  Your symptoms are not helped by medicine.  You have chills.  You are getting more short of breath.  You have brown or red mucus.    You have yellow or brown discharge from your nose.  You have pain in your face, especially when you bend forward.  You have a fever.  You have puffy (swollen) neck glands.  You have pain while swallowing.  You have white areas in the back of your throat. Get help right away if:  You have very bad or constant: ? Headache. ? Ear pain. ? Pain in your forehead, behind your eyes, and over your cheekbones (sinus pain). ? Chest pain.  You have long-lasting (chronic) lung disease and any of the  following: ? Wheezing. ? Long-lasting cough. ? Coughing up blood. ? A change in your usual mucus.  You have a stiff neck.  You have changes in your: ? Vision. ? Hearing. ? Thinking. ? Mood. This information is not intended to replace advice given to you by your health care provider. Make sure you discuss any questions you have with your health care provider. Document Released: 06/26/2007 Document Revised: 09/10/2015 Document Reviewed: 04/14/2013 Elsevier Interactive Patient Education  2018 Elsevier Inc.  

## 2016-08-03 NOTE — Progress Notes (Signed)
Kendra Nguyen 36 y.o.   Chief Complaint  Patient presents with  . Cough  . Sinus Problem    HISTORY OF PRESENT ILLNESS: This is a 36 y.o. female complaining of cough x 1 week; productive of yellow phlegm; also having purulent nasal discharge and pain to left ear.  Cough  This is a new problem. The current episode started in the past 7 days. The problem has been gradually worsening. The problem occurs constantly. The cough is productive of sputum. Associated symptoms include ear congestion, ear pain, nasal congestion and rhinorrhea. Pertinent negatives include no chest pain, chills, eye redness, fever, headaches, hemoptysis, myalgias, rash, sore throat, shortness of breath or wheezing.     Prior to Admission medications   Medication Sig Start Date End Date Taking? Authorizing Provider  cetirizine (ZYRTEC) 10 MG tablet Take 1 tablet (10 mg total) by mouth daily. 07/22/16  Yes English, Colletta Maryland D, PA  chlorhexidine (HIBICLENS) 4 % external liquid Apply topically daily as needed. 05/17/16  Yes Tereasa Coop, PA-C  norethindrone (MICRONOR,CAMILA,ERRIN) 0.35 MG tablet Take 1 tablet by mouth daily.   Yes [provider]  Prenatal MV-Min-FA-Omega-3 (PRENATAL GUMMIES/DHA & FA) 0.4-32.5 MG CHEW Chew 2 tablets by mouth daily.   Yes [provider]  ranitidine (ZANTAC) 150 MG tablet Take 1 tablet (150 mg total) by mouth 2 (two) times daily. 07/22/16  Yes English, Colletta Maryland D, PA  triamcinolone cream (KENALOG) 0.1 % Apply 1 application topically 2 (two) times daily. 06/13/16  Yes English, Colletta Maryland D, PA  Vilazodone HCl (VIIBRYD) 40 MG TABS Take 40 mg by mouth daily.   Yes [provider]  azithromycin (ZITHROMAX) 250 MG tablet Sig as indicated 08/03/16   Horald Pollen, MD  Drospirenone-Ethinyl Estradiol-Levomefol [facility]) 3-0.02-0.451 MG tablet  07/25/16   [provider]  promethazine-codeine (PHENERGAN WITH CODEINE) 6.25-10 MG/5ML syrup Take 5 mLs by mouth  at bedtime as needed for cough. 08/03/16   Horald Pollen, MD    Allergies  Allergen Reactions  . Tape     rash  . Neomycin Rash    Patient Active Problem List   Diagnosis Date Noted  . Tenosynovitis, de Quervain 04/09/2016  . Pre-eclampsia affecting pregnancy, antepartum 01/17/2016  . Preeclampsia, third trimester 01/17/2016  . S/P gastric bypass 12/18/2014  . 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 Pure hypercholesterolemia 12/15/2013  . Vitamin D deficiency 12/15/2013  . Morbid obesity ([place]) 03/21/2011    Past Medical History:  Diagnosis Date  . Hx of laparoscopic gastric banding   . Hyperlipemia   . Hypertension   . Morbid obesity with BMI of 45.0-49.9, adult ([place])   . Varicose veins     Past Surgical History:  Procedure Laterality Date  . CESAREAN SECTION N/A 01/18/2016   Procedure: CESAREAN SECTION;  Surgeon: Everett Graff, MD;  Location: [facility];  Service: Obstetrics;  Laterality: N/A;  . COLONOSCOPY WITH PROPOFOL N/A 11/24/2014   Procedure: COLONOSCOPY WITH PROPOFOL;  Surgeon: Milus Banister, MD;  [facility];  Service: Endoscopy;  Laterality: N/A;  . LAPAROSCOPIC GASTRIC BANDING  04/17/2011  . [place] RESECTION     2016  . WISDOM TOOTH EXTRACTION      Social History   Social History  . Marital status: Single    Spouse name: N/A  . Number of children: N/A  . Years of education: Master's   Occupational History  . Teacher- middle school    Social History Main  Topics  . Smoking status: Never Smoker  . Smokeless tobacco: Never Used  . Alcohol use No  . Drug use: No  . Sexual activity: Yes    Birth control/ protection: None   Other Topics Concern  . Not on file   Social History Narrative   Lives at home by herself.   Caffeine use: Soda occass [name])       Family History  Problem Relation Age of Onset  . Diabetes Mother   . Heart disease Mother   . Arthritis Father         gout  . COPD Father        smoker  . Hyperlipidemia Father   . Cancer Father   . Aneurysm Sister 34       cerebral;   . COPD Paternal 67   . Neuropathy Neg Hx      Review of Systems  Constitutional: Negative.  Negative for chills, fever and malaise/fatigue.  HENT: Positive for congestion, ear pain and rhinorrhea. Negative for sore throat.   Eyes: Negative.  Negative for blurred vision, double vision, discharge and redness.  Respiratory: Positive for cough and sputum production. Negative for hemoptysis, shortness of breath and wheezing.   Cardiovascular: Negative for chest pain, palpitations, claudication and leg swelling.  Gastrointestinal: Negative.  Negative for abdominal pain, diarrhea, nausea and vomiting.  Genitourinary: Negative.  Negative for dysuria and hematuria.  Musculoskeletal: Negative for back pain, myalgias and neck pain.  Skin: Negative.  Negative for rash.  Neurological: Negative for dizziness, sensory change and headaches.  Endo/Heme/Allergies: Negative.   All other systems reviewed and are negative.   Vitals:   08/03/16 1419  BP: 138/85  Pulse: 88  Resp: 16  Temp: 99.1 F (37.3 C)    Physical Exam  Constitutional: She is oriented to person, place, and time. She appears well-developed.  Obese.  HENT:  Head: Normocephalic and atraumatic.  Right Ear: Tympanic membrane, external ear and ear canal normal.  Left Ear: External ear and ear canal normal. Tympanic membrane is injected.  Nose: Nose normal.  Mouth/Throat: Oropharynx is clear and moist.  Eyes: Pupils are equal, round, and reactive to light. Conjunctivae and EOM are normal.  Neck: Normal range of motion. Neck supple. No JVD present. No thyromegaly present.  Cardiovascular: Normal rate, regular rhythm, normal heart sounds and intact distal pulses.   Pulmonary/Chest: Effort normal and breath sounds normal.  Abdominal: Soft. Bowel sounds are normal. She exhibits no distension. There is no  tenderness.  Musculoskeletal: Normal range of motion.  Lymphadenopathy:    She has no cervical adenopathy.  Neurological: She is alert and oriented to person, place, and time. No sensory deficit. She exhibits normal muscle tone.  Skin: Skin is warm and dry. Capillary refill takes less than 2 seconds. No rash noted.  Psychiatric: She has a normal mood and affect. Her behavior is normal.  Vitals reviewed.    ASSESSMENT & PLAN: Carigan was seen today for cough and sinus problem.  Diagnoses and all orders for this visit:  Acute upper respiratory infection  Cough -     promethazine-codeine (PHENERGAN WITH CODEINE) 6.25-10 MG/5ML syrup; Take 5 mLs by mouth at bedtime as needed for cough.  Left otitis media, unspecified otitis media type -     azithromycin (ZITHROMAX) 250 MG tablet; Sig as indicated    Patient Instructions       IF you received an x-ray today, you will receive an invoice from [facility]. Please  contact [facility] at [phone] with questions or concerns regarding your invoice.   IF you received labwork today, you will receive an invoice from [facility]. Please contact [facility] at [phone] with questions or concerns regarding your invoice.   Our billing staff will not be able to assist you with questions regarding bills from these companies.  You will be contacted with the lab results as soon as they are available. The fastest way to get your results is to activate your My Chart account. Instructions are located on the last page of this paperwork. If you have not heard from [place] regarding the results in 2 weeks, please contact this office.      Otitis Media, Adult Otitis media is redness, soreness, and puffiness (swelling) in the space just behind your eardrum (middle ear). It may be caused by allergies or infection. It often happens along with a cold. Follow these instructions at home:  Take your medicine as told. Finish it even if you  start to feel better.  Only take over-the-counter or prescription medicines for pain, discomfort, or fever as told by your doctor.  Follow up with your doctor as told. Contact a doctor if:  You have otitis media only in one ear, or bleeding from your nose, or both.  You notice a lump on your neck.  You are not getting better in 3-5 days.  You feel worse instead of better. Get help right away if:  You have pain that is not helped with medicine.  You have puffiness, redness, or pain around your ear.  You get a stiff neck.  You cannot move part of your face (paralysis).  You notice that the bone behind your ear hurts when you touch it. This information is not intended to replace advice given to you by your health care provider. Make sure you discuss any questions you have with your health care provider. Document Released: 06/26/2007 Document Revised: 06/15/2015 Document Reviewed: 08/04/2012 Elsevier Interactive Patient Education  2017 [place].  Upper Respiratory Infection, Adult Most upper respiratory infections (URIs) are caused by a virus. A URI affects the nose, throat, and upper air passages. The most common type of URI is often called "the common cold." Follow these instructions at home:  Take medicines only as told by your doctor.  Gargle warm saltwater or take cough drops to comfort your throat as told by your doctor.  Use a warm mist humidifier or inhale steam from a shower to increase air moisture. This may make it easier to breathe.  Drink enough fluid to keep your pee (urine) clear or pale yellow.  Eat soups and other clear broths.  Have a healthy diet.  Rest as needed.  Go back to work when your fever is gone or your doctor says it is okay. ? You may need to stay home longer to avoid giving your URI to others. ? You can also wear a face mask and wash your hands often to prevent spread of the virus.  Use your inhaler more if you have asthma.  Do not  use any tobacco products, including cigarettes, chewing tobacco, or electronic cigarettes. If you need help quitting, ask your doctor. Contact a doctor if:  You are getting worse, not better.  Your symptoms are not helped by medicine.  You have chills.  You are getting more short of breath.  You have brown or red mucus.  You have yellow or brown discharge from your nose.  You have pain in your  face, especially when you bend forward.  You have a fever.  You have puffy (swollen) neck glands.  You have pain while swallowing.  You have white areas in the back of your throat. Get help right away if:  You have very bad or constant: ? Headache. ? Ear pain. ? Pain in your forehead, behind your eyes, and over your cheekbones (sinus pain). ? Chest pain.  You have long-lasting (chronic) lung disease and any of the following: ? Wheezing. ? Long-lasting cough. ? Coughing up blood. ? A change in your usual mucus.  You have a stiff neck.  You have changes in your: ? Vision. ? Hearing. ? Thinking. ? Mood. This information is not intended to replace advice given to you by your health care provider. Make sure you discuss any questions you have with your health care provider. Document Released: 06/26/2007 Document Revised: 09/10/2015 Document Reviewed: 04/14/2013 Elsevier Interactive Patient Education  2018 Elsevier Inc.      Agustina Caroli, MD [facility]

## 2016-08-18 ENCOUNTER — Other Ambulatory Visit: Payer: Self-pay | Admitting: Physician Assistant

## 2016-08-18 DIAGNOSIS — L309 Dermatitis, unspecified: Secondary | ICD-10-CM

## 2016-09-28 ENCOUNTER — Emergency Department
Admission: EM | Admit: 2016-09-28 | Discharge: 2016-09-28 | Disposition: A | Discharge status: Home / Self Care | Payer: Worker's Compensation | Attending: Emergency Medicine | Admitting: Emergency Medicine

## 2016-09-28 ENCOUNTER — Encounter: Payer: Self-pay | Admitting: Emergency Medicine

## 2016-09-28 ENCOUNTER — Emergency Department: Payer: Worker's Compensation

## 2016-09-28 DIAGNOSIS — Y999 Unspecified external cause status: Secondary | ICD-10-CM | POA: Diagnosis not present

## 2016-09-28 DIAGNOSIS — Z79899 Other long term (current) drug therapy: Secondary | ICD-10-CM | POA: Diagnosis not present

## 2016-09-28 DIAGNOSIS — W1842XA Slipping, tripping and stumbling without falling due to stepping into hole or opening, initial encounter: Secondary | ICD-10-CM | POA: Insufficient documentation

## 2016-09-28 DIAGNOSIS — Y9301 Activity, walking, marching and hiking: Secondary | ICD-10-CM | POA: Insufficient documentation

## 2016-09-28 DIAGNOSIS — Y929 Unspecified place or not applicable: Secondary | ICD-10-CM | POA: Insufficient documentation

## 2016-09-28 DIAGNOSIS — S93402A Sprain of unspecified ligament of left ankle, initial encounter: Secondary | ICD-10-CM

## 2016-09-28 DIAGNOSIS — I10 Essential (primary) hypertension: Secondary | ICD-10-CM | POA: Insufficient documentation

## 2016-09-28 DIAGNOSIS — S99912A Unspecified injury of left ankle, initial encounter: Secondary | ICD-10-CM | POA: Diagnosis present

## 2016-09-28 MED ORDER — OXYCODONE-ACETAMINOPHEN 5-325 MG PO TABS
ORAL_TABLET | ORAL | Status: AC
  Administered 2016-09-28: 1 via ORAL
  Filled 2016-09-28: qty 1

## 2016-09-28 MED ORDER — IBUPROFEN 600 MG PO TABS
600.0000 mg | ORAL_TABLET | Freq: Once | ORAL | Status: AC
  Administered 2016-09-28: 600 mg via ORAL
  Filled 2016-09-28: qty 1

## 2016-09-28 MED ORDER — IBUPROFEN 600 MG PO TABS: 600.0000 mg | ORAL_TABLET | Freq: Four times a day (QID) | ORAL | 0 refills | Status: DC | PRN

## 2016-09-28 MED ORDER — OXYCODONE-ACETAMINOPHEN 5-325 MG PO TABS
1.0000 | ORAL_TABLET | Freq: Once | ORAL | Status: AC
  Administered 2016-09-28: 1 via ORAL

## 2016-09-28 NOTE — ED Triage Notes (Signed)
Pt presents to ED via [facility] c/o L foot injury. States she was watching a game at [employer], accidentally stepped in a hole, foot rolled outward. Paramedic on scene stated he noted obvious deformity to L lateral ankle. Swelling/bruising present at this time. Pedal pulse present, marked by EMS. Pt states normal sensation to foot and toes.

## 2016-09-28 NOTE — ED Notes (Signed)

## 2016-09-28 NOTE — ED Provider Notes (Signed)
[facility] Emergency Department Provider Note ____________________________________________   First MD Initiated Contact with Patient 09/28/16 1346     (approximate)  I have reviewed the triage vital signs and the nursing notes.   HISTORY  Chief Complaint Foot Injury    HPI Kendra Nguyen is a 36 y.o. female  who presents with left ankle injury acute onset within the last hour, when patient was walking and stepped into a hole causing her to invert the ankle and fall.  She perceived a "pop" when this happened.  She denies any other associated injuries. She reports throbbing pain down into her foot but denies any weakness or numbness.  Past Medical History:  Diagnosis Date  . Hx of laparoscopic gastric banding   . Hyperlipemia   . Hypertension   . Morbid obesity with BMI of 45.0-49.9, adult ([place])   . Varicose veins     Patient Active Problem List   Diagnosis Date Noted  . Tenosynovitis, de Quervain 04/09/2016  . Pre-eclampsia affecting pregnancy, antepartum 01/17/2016  . Preeclampsia, third trimester 01/17/2016  . S/P gastric bypass 12/18/2014  . 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 Pure hypercholesterolemia 12/15/2013  . Vitamin D deficiency 12/15/2013  . Morbid obesity ([place]) 03/21/2011    Past Surgical History:  Procedure Laterality Date  . CESAREAN SECTION N/A 01/18/2016   Procedure: CESAREAN SECTION;  Surgeon: Everett Graff, MD;  Location: [place];  Service: Obstetrics;  Laterality: N/A;  . COLONOSCOPY WITH PROPOFOL N/A 11/24/2014   Procedure: COLONOSCOPY WITH PROPOFOL;  Surgeon: Milus Banister, MD;  [facility];  Service: Endoscopy;  Laterality: N/A;  . LAPAROSCOPIC GASTRIC BANDING  04/17/2011  . [place] RESECTION     2016  . WISDOM TOOTH EXTRACTION      Prior to Admission medications   Medication Sig Start Date End Date Taking? Authorizing Provider  azithromycin  (ZITHROMAX) 250 MG tablet Sig as indicated 08/03/16   Horald Pollen, MD  cetirizine (ZYRTEC) 10 MG tablet Take 1 tablet (10 mg total) by mouth daily. 07/22/16   Ivar Drape D, PA  chlorhexidine (HIBICLENS) 4 % external liquid Apply topically daily as needed. 05/17/16   Tereasa Coop, PA-C  Drospirenone-Ethinyl Estradiol-Levomefol (BEYAZ) 3-0.02-0.451 MG tablet  07/25/16   [provider]  ibuprofen (ADVIL,MOTRIN) 600 MG tablet Take 1 tablet (600 mg total) by mouth every 6 (six) hours as needed (pain). 09/28/16   Arta Silence, MD  norethindrone (MICRONOR,CAMILA,ERRIN) 0.35 MG tablet Take 1 tablet by mouth daily.    [provider]  Prenatal MV-Min-FA-Omega-3 (PRENATAL GUMMIES/DHA & FA) 0.4-32.5 MG CHEW Chew 2 tablets by mouth daily.    [provider]  promethazine-codeine (PHENERGAN WITH CODEINE) 6.25-10 MG/5ML syrup Take 5 mLs by mouth at bedtime as needed for cough. 08/03/16   Horald Pollen, MD  ranitidine (ZANTAC) 150 MG tablet Take 1 tablet (150 mg total) by mouth 2 (two) times daily. 07/22/16   Ivar Drape D, PA  triamcinolone cream (KENALOG) 0.1 % Apply 1 application topically 2 (two) times daily. 06/13/16   Ivar Drape D, PA  Vilazodone HCl (VIIBRYD) 40 MG TABS Take 40 mg by mouth daily.    [provider]    Allergies Tape and Neomycin  Family History  Problem Relation Age of Onset  . Diabetes Mother   . Heart disease Mother   . Arthritis Father        gout  . COPD Father  smoker  . Hyperlipidemia Father   . Cancer Father   . Aneurysm Sister 34       cerebral;   . COPD Paternal 27   . Neuropathy Neg Hx     Social History Social History  Substance Use Topics  . Smoking status: Never Smoker  . Smokeless tobacco: Never Used  . Alcohol use No    Review of Systems  Constitutional: No fever/chills Respiratory: Denies shortness of breath. Gastrointestinal: No nausea, no vomiting.     Musculoskeletal: Positive for L ankle pain Skin: Negative for laceration Neurological: Negative for focal weakness or numbness.   ____________________________________________   PHYSICAL EXAM:  VITAL SIGNS: ED Triage Vitals  Enc Vitals Group     BP 09/28/16 1331 (!) 149/83     Pulse Rate 09/28/16 1331 74     Resp 09/28/16 1331 18     Temp 09/28/16 1331 98.1 F (36.7 C)     Temp Source 09/28/16 1331 Oral     SpO2 09/28/16 1331 100 %     Weight 09/28/16 1331 (!) 317 lb 9.6 oz (144.1 kg)     Height 09/28/16 1331 5\' 6"  (1.676 m)     Head Circumference --      Peak Flow --      Pain Score 09/28/16 1330 8     Pain Loc --      Pain Edu? --      Excl. in [place]? --     Constitutional: Alert and oriented. Well appearing and in no acute distress. Eyes: Conjunctivae are normal.  Head: Atraumatic. Nose: No congestion/rhinnorhea. Mouth/Throat: Mucous membranes are moist.   Neck: Normal range of motion.  Cardiovascular: Good peripheral circulation. Respiratory: Normal respiratory effort.   Gastrointestinal: No distention.  Musculoskeletal: No lower extremity edema.  Extremities warm and well perfused. left ankle with swelling over the lateral malleolus, 2+ DP pulse, cap refill less than 2 seconds, no tenderness to the medial malleolus or Achilles, 5 out of 5 motor strength and intact sensation to the ankle and foot, full range of motion of the knee. Neurologic:  Normal speech and language. No gross focal neurologic deficits are appreciated.  Skin:  Skin is warm and dry. No rash noted. Psychiatric: Mood and affect are normal. Speech and behavior are normal.  ____________________________________________   LABS (all labs ordered are listed, but only abnormal results are displayed)  Labs Reviewed - No data to  display ____________________________________________  EKG   ____________________________________________  RADIOLOGY    ____________________________________________   PROCEDURES  Procedure(s) performed: No    Critical Care performed: No ____________________________________________   INITIAL IMPRESSION / ASSESSMENT AND PLAN / ED COURSE  Pertinent labs & imaging results that were available during my care of the patient were reviewed by me and considered in my medical decision making (see chart for details).  36 year old female presents with left ankle inversion injury acute onset when she stepped into a hole. Vital signs are normal, exam is as described; the left ankle and foot are neurovascularly intact. Presentation consistent with ankle sprain. Plan for x-rays, NSAIDs, and likely discharge home with Ace wrap, crutches and weightbearing as tolerated. There is no pain and no tenderness over the Achilles area and patient did not have an injury mechanism consistent with Achilles tear.      ____________________________________________   FINAL CLINICAL IMPRESSION(S) / ED DIAGNOSES  Final diagnoses:  Sprain of left ankle, unspecified ligament, initial encounter      NEW MEDICATIONS STARTED DURING  THIS VISIT:  Discharge Medication List as of 09/28/2016  2:56 PM    START taking these medications   Details  ibuprofen (ADVIL,MOTRIN) 600 MG tablet Take 1 tablet (600 mg total) by mouth every 6 (six) hours as needed (pain)., Starting Sat 09/28/2016, Print         Note:  This document was prepared using Dragon voice recognition software and may include unintentional dictation errors.    Arta Silence, MD 09/28/16 [phone]

## 2016-09-28 NOTE — Discharge Instructions (Signed)
Rest, ice, compress (with Ace wrap), and elevate the ankle.  You may bear weight on the ankle as tolerated. You should follow-up with an orthopedist in approximately 2 weeks if you have persistent pain.  Return to the ER for new or worsening pain, numbness, weakness or any other symptoms that concern you.

## 2016-11-11 ENCOUNTER — Ambulatory Visit (INDEPENDENT_AMBULATORY_CARE_PROVIDER_SITE_OTHER): Payer: BC Managed Care – PPO | Admitting: Orthopaedic Surgery

## 2016-11-12 ENCOUNTER — Ambulatory Visit (INDEPENDENT_AMBULATORY_CARE_PROVIDER_SITE_OTHER): Payer: BC Managed Care – PPO

## 2016-11-12 ENCOUNTER — Encounter (INDEPENDENT_AMBULATORY_CARE_PROVIDER_SITE_OTHER): Payer: Self-pay | Admitting: Orthopaedic Surgery

## 2016-11-12 ENCOUNTER — Ambulatory Visit (INDEPENDENT_AMBULATORY_CARE_PROVIDER_SITE_OTHER): Payer: BC Managed Care – PPO | Admitting: Orthopaedic Surgery

## 2016-11-12 DIAGNOSIS — M25572 Pain in left ankle and joints of left foot: Secondary | ICD-10-CM

## 2016-11-12 DIAGNOSIS — M654 Radial styloid tenosynovitis [de Quervain]: Secondary | ICD-10-CM

## 2016-11-12 MED ORDER — LIDOCAINE HCL 1 % IJ SOLN
0.3000 mL | INTRAMUSCULAR | Status: AC | PRN
  Administered 2016-11-12: .3 mL

## 2016-11-12 MED ORDER — METHYLPREDNISOLONE ACETATE 40 MG/ML IJ SUSP
13.3300 mg | INTRAMUSCULAR | Status: AC | PRN
  Administered 2016-11-12: 13.33 mg

## 2016-11-12 MED ORDER — BUPIVACAINE HCL 0.5 % IJ SOLN
0.3300 mL | INTRAMUSCULAR | Status: AC | PRN
  Administered 2016-11-12: .33 mL

## 2016-11-12 NOTE — Progress Notes (Signed)
Office Visit Note   Patient: Kendra Nguyen           Date of Birth: 09-05-1980           MRN: [id number] Visit Date: 11/12/2016              Requested by: Joretta Bachelor, PA [place], [place] [id number] PCP: Joretta Bachelor, PA   Assessment & Plan: Visit Diagnoses:  1. Pain in left ankle and joints of left foot   2. Tenosynovitis, de Quervain     Plan: Impression is recurrent left de Quervain's tenosynovitis and left ankle sprain. Recommend physical therapy for the left ankle sprain.  We did perform another injection into the first dorsal wrist compartment.  Questions encouraged and answered.  Follow-up as needed.    Follow-Up Instructions: Return if symptoms worsen or fail to improve.   Orders:  Orders Placed This Encounter  Procedures  . XR Ankle Complete Left   No orders of the defined types were placed in this encounter.     Procedures: Hand/UE Inj Date/Time: 11/12/2016 3:52 PM Performed by: Leandrew Koyanagi Authorized by: Leandrew Koyanagi   Consent Given by:  Patient Timeout: prior to procedure the correct patient, procedure, and site was verified   Indications:  Pain Condition: de Quervain's   Site:  L extensor compartment 1 Prep: patient was prepped and draped in usual sterile fashion   Needle Size:  25 G Approach:  Radial Medications:  0.3 mL lidocaine 1 %; 0.33 mL bupivacaine 0.5 %; 13.33 mg methylPREDNISolone acetate 40 MG/ML     Clinical Data: No additional findings.   Subjective: Chief Complaint  Patient presents with  . Left Ankle - Pain  . Left Wrist - Pain    Patient follows up today for her left de Quervain's tenosynovitis and new injury to her left ankle as she sustained approximately a month ago.  It has gotten somewhat better but she continues to have lateral ankle pain.  She initially had x-rays in the ER that was negative.  She denies any numbness and tingling.    Review of Systems  Constitutional: Negative.     HENT: Negative.   Eyes: Negative.   Respiratory: Negative.   Cardiovascular: Negative.   Endocrine: Negative.   Musculoskeletal: Negative.   Neurological: Negative.   Hematological: Negative.   Psychiatric/Behavioral: Negative.   All other systems reviewed and are negative.    Objective: Vital Signs: There were no vitals taken for this visit.  Physical Exam  Constitutional: She is oriented to person, place, and time. She appears well-developed and well-nourished.  Pulmonary/Chest: Effort normal.  Neurological: She is alert and oriented to person, place, and time.  Skin: Skin is warm. Capillary refill takes less than 2 seconds.  Psychiatric: She has a normal mood and affect. Her behavior is normal. Judgment and thought content normal.  Nursing note and vitals reviewed.   Ortho Exam Left first exam shows positive Finkelstein's test. Left ankle exam shows mild tenderness along the lateral ankle ligaments.There is no significant swelling.  Ankle joint is stable.  Tendons are stable. Specialty Comments:  No specialty comments available.  Imaging: No results found.   PMFS History: Patient Active Problem List   Diagnosis Date Noted  . Tenosynovitis, de Quervain 04/09/2016  . Pre-eclampsia affecting pregnancy, antepartum 01/17/2016  . Preeclampsia, third trimester 01/17/2016  . S/P gastric bypass 12/18/2014  . 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 Pure hypercholesterolemia  12/15/2013  . Vitamin D deficiency 12/15/2013  . Morbid obesity ([place]) 03/21/2011   Past Medical History:  Diagnosis Date  . Hx of laparoscopic gastric banding   . Hyperlipemia   . Hypertension   . Morbid obesity with BMI of 45.0-49.9, adult ([place])   . Varicose veins     Family History  Problem Relation Age of Onset  . Diabetes Mother   . Heart disease Mother   . Arthritis Father        gout  . COPD Father        smoker  . Hyperlipidemia Father   . Cancer Father   .  Aneurysm Sister 34       cerebral;   . COPD Paternal 36   . Neuropathy Neg Hx     Past Surgical History:  Procedure Laterality Date  . CESAREAN SECTION N/A 01/18/2016   Procedure: CESAREAN SECTION;  Surgeon: Everett Graff, MD;  Location: [facility];  Service: Obstetrics;  Laterality: N/A;  . COLONOSCOPY WITH PROPOFOL N/A 11/24/2014   Procedure: COLONOSCOPY WITH PROPOFOL;  Surgeon: Milus Banister, MD;  [facility];  Service: Endoscopy;  Laterality: N/A;  . LAPAROSCOPIC GASTRIC BANDING  04/17/2011  . [place] RESECTION     2016  . WISDOM TOOTH EXTRACTION     Social History   Occupational History  . Teacher- middle school    Social History Main Topics  . Smoking status: Never Smoker  . Smokeless tobacco: Never Used  . Alcohol use No  . Drug use: No  . Sexual activity: Yes    Birth control/ protection: None

## 2017-01-11 ENCOUNTER — Encounter: Payer: Self-pay | Admitting: Physician Assistant

## 2017-01-11 ENCOUNTER — Ambulatory Visit: Payer: BC Managed Care – PPO | Admitting: Physician Assistant

## 2017-01-11 VITALS — BP 128/82 | HR 78 | Temp 98.5°F | Resp 16 | Ht 65.5 in | Wt 328.0 lb

## 2017-01-11 DIAGNOSIS — N898 Other specified noninflammatory disorders of vagina: Secondary | ICD-10-CM | POA: Diagnosis not present

## 2017-01-11 LAB — POCT WET + KOH PREP
Trich by wet prep: ABSENT
YEAST BY WET PREP: ABSENT

## 2017-01-11 LAB — POCT URINE PREGNANCY: Preg Test, Ur: NEGATIVE

## 2017-01-11 MED ORDER — FLUCONAZOLE 150 MG PO TABS: 150.0000 mg | ORAL_TABLET | Freq: Once | ORAL | 0 refills | Status: AC

## 2017-01-11 NOTE — Progress Notes (Signed)
01/12/2017 9:01 AM   DOB: 1980/11/23 / MRN: [id number]  SUBJECTIVE:  Kendra Nguyen is a 36 y.o. female presenting for vaginal irritation that started after taking amoxicillin. Associate itching and some mild burning.  Urinating makes the pain better.  She has not tried anything for this at present.  She does not want to deal with creams. She is sexually active in a trusting relationship at this.  She is allergic to tape and neomycin.   She  has a past medical history of laparoscopic gastric banding, Hyperlipemia, Hypertension, Morbid obesity with BMI of 45.0-49.9, adult ([place]), and Varicose veins.    She  reports that  has never smoked. she has never used smokeless tobacco. She reports that she does not drink alcohol or use drugs. She  reports that she currently engages in sexual activity. She reports using the following method of birth control/protection: None. The patient  has a past surgical history that includes Laparoscopic gastric banding (04/17/2011); Laparoscopic gastric sleeve resection; Colonoscopy with propofol (N/A, 11/24/2014); Wisdom tooth extraction; and Cesarean section (N/A, 01/18/2016).  Her family history includes Aneurysm (age of onset: 11) in her sister; Arthritis in her father; COPD in her father and paternal aunt; Cancer in her father; Diabetes in her mother; Heart disease in her mother; Hyperlipidemia in her father.  Review of Systems  Constitutional: Negative for chills and fever.  Gastrointestinal: Negative for abdominal pain.  Genitourinary: Negative for dysuria, flank pain, frequency, hematuria and urgency.  Musculoskeletal: Negative for myalgias.  Skin: Positive for itching. Negative for rash.  Neurological: Negative for dizziness.    The problem list and medications were reviewed and updated by myself where necessary and exist elsewhere in the encounter.   OBJECTIVE:  BP 128/82   Pulse 78   Temp 98.5 F (36.9 C) (Oral)   Resp 16   Ht 5' 5.5" (1.664  m)   Wt (!) 328 lb (148.8 kg)   LMP 12/19/2016   SpO2 100%   BMI 53.75 kg/m   Physical Exam  Constitutional: She is active.  Non-toxic appearance.  Cardiovascular: Normal rate.  Pulmonary/Chest: Effort normal. No tachypnea.  Neurological: She is alert.  Skin: Skin is warm and dry. She is not diaphoretic. No pallor.    Results for orders placed or performed in visit on 01/11/17 (from the past 72 hour(s))  POCT urine pregnancy     Status: None   Collection Time: 01/11/17  3:54 PM  Result Value Ref Range   Preg Test, Ur Negative Negative  POCT Wet + KOH Prep     Status: Abnormal   Collection Time: 01/11/17  4:41 PM  Result Value Ref Range   Yeast by KOH Present (A) Absent   Yeast by wet prep Absent Absent   WBC by wet prep Few Few   Clue Cells Wet Prep HPF POC None None   Trich by wet prep Absent Absent   Bacteria Wet Prep HPF POC Moderate (A) Few   Epithelial Cells By [employer] Pref ([facility]) Many (A) None, Few, Too numerous to count   RBC,UR,HPF,POC None None RBC/hpf    No results found.  ASSESSMENT AND PLAN:  Kendra Nguyen was seen today for vaginal irritation and flu vaccine.  Diagnoses and all orders for this visit:  Vaginal irritation -     POCT Wet + KOH Prep -     POCT urine pregnancy  Other orders -     fluconazole (DIFLUCAN) 150 MG tablet; Take 1 tablet (150  mg total) by mouth once for 1 dose. Repeat if needed    The patient is advised to call or return to clinic if she does not see an improvement in symptoms, or to seek the care of the closest emergency department if she worsens with the above plan.   Philis Fendt, MHS, PA-C [facility] 01/12/2017 9:01 AM

## 2017-01-11 NOTE — Patient Instructions (Signed)
     IF you received an x-ray today, you will receive an invoice from [facility]. Please contact [facility] at [phone] with questions or concerns regarding your invoice.   IF you received labwork today, you will receive an invoice from [facility]. Please contact [facility] at [phone] with questions or concerns regarding your invoice.   Our billing staff will not be able to assist you with questions regarding bills from these companies.  You will be contacted with the lab results as soon as they are available. The fastest way to get your results is to activate your My Chart account. Instructions are located on the last page of this paperwork. If you have not heard from us regarding the results in 2 weeks, please contact this office.     

## 2017-02-02 ENCOUNTER — Encounter (HOSPITAL_COMMUNITY): Payer: Self-pay | Admitting: *Deleted

## 2017-02-02 ENCOUNTER — Other Ambulatory Visit: Payer: Self-pay

## 2017-02-02 ENCOUNTER — Ambulatory Visit (HOSPITAL_COMMUNITY)
Admission: EM | Admit: 2017-02-02 | Discharge: 2017-02-02 | Disposition: A | Discharge status: Home / Self Care | Payer: BC Managed Care – PPO | Attending: Internal Medicine | Admitting: Internal Medicine

## 2017-02-02 DIAGNOSIS — L03115 Cellulitis of right lower limb: Secondary | ICD-10-CM | POA: Diagnosis not present

## 2017-02-02 MED ORDER — SULFAMETHOXAZOLE-TRIMETHOPRIM 800-160 MG PO TABS: 1.0000 | ORAL_TABLET | Freq: Two times a day (BID) | ORAL | 0 refills | Status: AC

## 2017-02-02 MED ORDER — CEPHALEXIN 500 MG PO CAPS: 500.0000 mg | ORAL_CAPSULE | Freq: Four times a day (QID) | ORAL | 0 refills | Status: AC

## 2017-02-02 NOTE — ED Provider Notes (Signed)
[place]    CSN: [id number] Arrival date & time: 02/02/17  1205     History   Chief Complaint Chief Complaint  Patient presents with  . Insect Bite    HPI Kendra Nguyen is a 37 y.o. female presenting with increasing redness and pain to her lower right extremity. States on Friday night while at home she felt a stinging sensation. Since redness has continued to increase. She was wearing jeans and boots at the time. States she sees spiders a lot in her home. No visible spiders seen at the time. Has history of previous abscesses and skin infections in axillae/back. No fever, nausea, vomiting.   HPI  Past Medical History:  Diagnosis Date  . Hx of laparoscopic gastric banding   . Hyperlipemia   . Hypertension    was taken of HTN meds by PCP "years ago"  . Morbid obesity with BMI of 45.0-49.9, adult ([place])   . Varicose veins     Patient Active Problem List   Diagnosis Date Noted  . Tenosynovitis, de Quervain 04/09/2016  . Pre-eclampsia affecting pregnancy, antepartum 01/17/2016  . Preeclampsia, third trimester 01/17/2016  . S/P gastric bypass 12/18/2014  . 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 Pure hypercholesterolemia 12/15/2013  . Vitamin D deficiency 12/15/2013  . Morbid obesity ([place]) 03/21/2011    Past Surgical History:  Procedure Laterality Date  . CESAREAN SECTION N/A 01/18/2016   Procedure: CESAREAN SECTION;  Surgeon: Everett Graff, MD;  Location: [facility];  Service: Obstetrics;  Laterality: N/A;  . COLONOSCOPY WITH PROPOFOL N/A 11/24/2014   Procedure: COLONOSCOPY WITH PROPOFOL;  Surgeon: Milus Banister, MD;  [facility];  Service: Endoscopy;  Laterality: N/A;  . LAPAROSCOPIC GASTRIC BANDING  04/17/2011  . [place] RESECTION     2016  . WISDOM TOOTH EXTRACTION      OB History    Gravida Para Term Preterm AB Living   1 1 1     1    SAB TAB Ectopic Multiple Live Births         0 1         Home Medications    Prior to Admission medications   Medication Sig Start Date End Date Taking? Authorizing Provider  Drospirenone-Ethinyl Estradiol-Levomefol (BEYAZ) 3-0.02-0.451 MG tablet  07/25/16  Yes [provider]  ibuprofen (ADVIL,MOTRIN) 600 MG tablet Take 600 mg by mouth every 8 (eight) hours as needed.   Yes [provider]  cephALEXin (KEFLEX) 500 MG capsule Take 1 capsule (500 mg total) by mouth 4 (four) times daily for 5 days. 02/02/17 02/07/17  Jayesh Marbach C, PA-C  sulfamethoxazole-trimethoprim (BACTRIM DS,SEPTRA DS) 800-160 MG tablet Take 1 tablet by mouth 2 (two) times daily for 7 days. 02/02/17 02/09/17  Remijio Holleran, Elesa Hacker, PA-C    Family History Family History  Problem Relation Age of Onset  . Diabetes Mother   . Heart disease Mother   . Arthritis Father        gout  . COPD Father        smoker  . Hyperlipidemia Father   . Cancer Father   . Aneurysm Sister 34       cerebral;   . COPD Paternal 34   . Neuropathy Neg Hx     Social History Social History   Tobacco Use  . Smoking status: Never Smoker  . Smokeless tobacco: Never Used  Substance Use Topics  . Alcohol use: No  . Drug  use: No     Allergies   Tape and Neomycin   Review of Systems Review of Systems  Constitutional: Negative for chills and fever.  Eyes: Negative for visual disturbance.  Respiratory: Negative for shortness of breath.   Cardiovascular: Negative for chest pain.  Gastrointestinal: Negative for abdominal pain, nausea and vomiting.  Musculoskeletal: Negative for arthralgias and myalgias.  Skin: Positive for color change. Negative for wound.  Neurological: Negative for dizziness, syncope, weakness, light-headedness and headaches.  All other systems reviewed and are negative.    Physical Exam Triage Vital Signs ED Triage Vitals [02/02/17 1230]  Enc Vitals Group     BP (!) 150/92     Pulse Rate 73     Resp 20     Temp 97.8 F (36.6 C)      Temp Source Oral     SpO2 97 %     Weight      Height      Head Circumference      Peak Flow      Pain Score 6     Pain Loc      Pain Edu?      Excl. in [place]?    No data found.  Updated Vital Signs BP (!) 150/92   Pulse 73   Temp 97.8 F (36.6 C) (Oral)   Resp 20   LMP 01/19/2017 (Approximate)   SpO2 97%   Breastfeeding? No    Physical Exam  Constitutional: She appears well-developed and well-nourished. No distress.  HENT:  Head: Normocephalic and atraumatic.  Eyes: Conjunctivae are normal.  Neck: Neck supple.  Cardiovascular: Normal rate.  No murmur heard. Pulmonary/Chest: Effort normal. No respiratory distress.  Abdominal: Soft. There is no tenderness.  Musculoskeletal: She exhibits no edema.  Neurological: She is alert.  Skin: Skin is warm and dry.  Skin with white papule and surrounding erythema to right lower anterior shin  Psychiatric: She has a normal mood and affect.  Nursing note and vitals reviewed.      UC Treatments / Results  Labs (all labs ordered are listed, but only abnormal results are displayed) Labs Reviewed - No data to display  EKG  EKG Interpretation None       Radiology No results found.  Procedures Procedures (including critical care time)  Medications Ordered in UC Medications - No data to display   Initial Impression / Assessment and Plan / UC Course  I have reviewed the triage vital signs and the nursing notes.  Pertinent labs & imaging results that were available during my care of the patient were reviewed by me and considered in my medical decision making (see chart for details).     Cellulitis vs. Bite. Will treat with keflex and bactrim. Continue to monitor redness for spread. Discussed strict return precautions. Patient verbalized understanding and is agreeable with plan.   Final Clinical Impressions(s) / UC Diagnoses   Final diagnoses:  Cellulitis of right lower extremity    ED Discharge Orders         Ordered    cephALEXin (KEFLEX) 500 MG capsule  4 times daily     02/02/17 1300    sulfamethoxazole-trimethoprim (BACTRIM DS,SEPTRA DS) 800-160 MG tablet  2 times daily     02/02/17 1300       Controlled Substance Prescriptions [place] Controlled Substance Registry consulted? Not Applicable   Janith Lima, [place] 02/02/17 1332

## 2017-02-02 NOTE — ED Triage Notes (Signed)
Reports feeling sensation of being bitten on right lateral lower leg 2 days ago.  Initially felt a small knot.  Today area is becoming more red.  Denies fevers.

## 2017-02-02 NOTE — Discharge Instructions (Signed)
Please take Keflex 4 times daily for 5 days.  Bactrim twice daily for 7 days.  Please monitor redness and swelling for it to increase, please return if worsening.

## 2017-04-02 ENCOUNTER — Encounter: Payer: Self-pay | Admitting: Physician Assistant

## 2017-04-02 ENCOUNTER — Ambulatory Visit: Payer: BC Managed Care – PPO | Admitting: Physician Assistant

## 2017-04-02 VITALS — BP 137/83 | HR 81 | Temp 98.1°F | Resp 18 | Ht 65.5 in | Wt 332.0 lb

## 2017-04-02 DIAGNOSIS — N898 Other specified noninflammatory disorders of vagina: Secondary | ICD-10-CM

## 2017-04-02 LAB — POCT URINALYSIS DIP (MANUAL ENTRY)
BILIRUBIN UA: NEGATIVE mg/dL
Bilirubin, UA: NEGATIVE
Blood, UA: NEGATIVE
GLUCOSE UA: NEGATIVE mg/dL
LEUKOCYTES UA: NEGATIVE
Nitrite, UA: NEGATIVE
Protein Ur, POC: NEGATIVE mg/dL
SPEC GRAV UA: 1.01 (ref 1.010–1.025)
UROBILINOGEN UA: 0.2 U/dL
pH, UA: 7 (ref 5.0–8.0)

## 2017-04-02 LAB — POCT WET + KOH PREP
TRICH BY WET PREP: ABSENT
Yeast by KOH: ABSENT
Yeast by wet prep: ABSENT

## 2017-04-02 LAB — POCT URINE PREGNANCY: PREG TEST UR: NEGATIVE

## 2017-04-02 MED ORDER — NYSTATIN-TRIAMCINOLONE 100000-0.1 UNIT/GM-% EX OINT: 1.0000 "application " | TOPICAL_OINTMENT | Freq: Two times a day (BID) | CUTANEOUS | 0 refills | Status: DC

## 2017-04-02 MED ORDER — FLUCONAZOLE 150 MG PO TABS: 150.0000 mg | ORAL_TABLET | ORAL | 0 refills | Status: DC

## 2017-04-02 NOTE — Patient Instructions (Signed)
     IF you received an x-ray today, you will receive an invoice from [facility]. Please contact [facility] at [phone] with questions or concerns regarding your invoice.   IF you received labwork today, you will receive an invoice from [facility]. Please contact [facility] at [phone] with questions or concerns regarding your invoice.   Our billing staff will not be able to assist you with questions regarding bills from these companies.  You will be contacted with the lab results as soon as they are available. The fastest way to get your results is to activate your My Chart account. Instructions are located on the last page of this paperwork. If you have not heard from us regarding the results in 2 weeks, please contact this office.     

## 2017-04-09 LAB — GC/CHLAMYDIA PROBE AMP
Chlamydia trachomatis, NAA: NEGATIVE
Neisseria gonorrhoeae by PCR: NEGATIVE

## 2017-04-14 ENCOUNTER — Encounter: Payer: Self-pay | Admitting: *Deleted

## 2017-04-18 ENCOUNTER — Encounter: Payer: Self-pay | Admitting: Physician Assistant

## 2017-04-18 NOTE — Progress Notes (Signed)
PRIMARY CARE AT [place], [place] [address] [phone]  Date:  04/02/2017   Name:  Kendra Nguyen   DOB:  05-29-1980   MRN:  [id number]  PCP:  Kendra Bachelor, PA    History of Present Illness:  Kendra Nguyen is a 37 y.o. female patient who presents to PCP with  Chief Complaint  Patient presents with  . Vaginal Discharge    possible yeast infection     Patient states she has abnormal discharge.  It is irritated.  She has also felt like the area is swollen.  There is some dysuria but she states that this is when the urine hits her skin.  There is no blood in the urine.  There is no frequency.  She denies abdominal pain.  There is no fever.  No nausea.  Patient Active Problem List   Diagnosis Date Noted  . Tenosynovitis, de Quervain 04/09/2016  . Pre-eclampsia affecting pregnancy, antepartum 01/17/2016  . Preeclampsia, third trimester 01/17/2016  . S/P gastric bypass 12/18/2014  . 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 Pure hypercholesterolemia 12/15/2013  . Vitamin D deficiency 12/15/2013  . Morbid obesity ([place]) 03/21/2011    Past Medical History:  Diagnosis Date  . Hx of laparoscopic gastric banding   . Hyperlipemia   . Hypertension    was taken of HTN meds by PCP "years ago"  . Morbid obesity with BMI of 45.0-49.9, adult ([place])   . Varicose veins     Past Surgical History:  Procedure Laterality Date  . CESAREAN SECTION N/A 01/18/2016   Procedure: CESAREAN SECTION;  Surgeon: Everett Graff, MD;  Location: [facility];  Service: Obstetrics;  Laterality: N/A;  . COLONOSCOPY WITH PROPOFOL N/A 11/24/2014   Procedure: COLONOSCOPY WITH PROPOFOL;  Surgeon: Kendra Banister, MD;  Location: [facility];  Service: Endoscopy;  Laterality: N/A;  . LAPAROSCOPIC GASTRIC BANDING  04/17/2011  . [place] RESECTION     2016  . WISDOM TOOTH EXTRACTION      Social History   Tobacco Use  . Smoking status:  Never Smoker  . Smokeless tobacco: Never Used  Substance Use Topics  . Alcohol use: No  . Drug use: No    Family History  Problem Relation Age of Onset  . Diabetes Mother   . Heart disease Mother   . Arthritis Father        gout  . COPD Father        smoker  . Hyperlipidemia Father   . Cancer Father   . Aneurysm Sister 34       cerebral;   . COPD Paternal 70   . Neuropathy Neg Hx     Allergies  Allergen Reactions  . Tape     rash  . Neomycin Rash    Medication list has been reviewed and updated.  Current Outpatient Medications on File Prior to Visit  Medication Sig Dispense Refill  . Drospirenone-Ethinyl Estradiol-Levomefol (BEYAZ) 3-0.02-0.451 MG tablet     . ibuprofen (ADVIL,MOTRIN) 600 MG tablet Take 600 mg by mouth every 8 (eight) hours as needed.     No current facility-administered medications on file prior to visit.     ROS ROS otherwise unremarkable unless listed above.  Physical Examination: BP 137/83   Pulse 81   Temp 98.1 F (36.7 C) (Oral)   Resp 18   Ht 5' 5.5" (1.664 m)   Wt (!) 332 lb (150.6 kg)  SpO2 100%   BMI 54.41 kg/m  Ideal Body Weight: Weight in (lb) to have BMI = 25: 152.2  Physical Exam  Constitutional: She is oriented to person, place, and time. She appears well-developed and well-nourished. No distress.  HENT:  Head: Normocephalic and atraumatic.  Right Ear: External ear normal.  Left Ear: External ear normal.  Eyes: Pupils are equal, round, and reactive to light. Conjunctivae and EOM are normal.  Cardiovascular: Normal rate.  Pulmonary/Chest: Effort normal. No respiratory distress.  Genitourinary:  Genitourinary Comments: Labia majora drawer is mildly erythematous and edematous.  She has some tenderness along the introitus.  There is heavy white vaginal discharge.  Vaginal canal is non-erythematous.  Neurological: She is alert and oriented to person, place, and time.  Skin: She is not diaphoretic.  Psychiatric: She has  a normal mood and affect. Her behavior is normal.   Results for orders placed or performed in visit on 04/02/17  GC/Chlamydia Probe Amp  Result Value Ref Range   Chlamydia trachomatis, NAA Negative Negative   Neisseria gonorrhoeae by PCR Negative Negative  POCT Wet + KOH Prep  Result Value Ref Range   Yeast by KOH Absent Absent   Yeast by wet prep Absent Absent   WBC by wet prep Few Few   Clue Cells Wet Prep HPF POC Few (A) None   Trich by wet prep Absent Absent   Bacteria Wet Prep HPF POC Many (A) Few   Epithelial Cells By [employer] Pref ([facility]) Moderate (A) None, Few, Too numerous to count   RBC,UR,HPF,POC None None RBC/hpf  POCT urinalysis dipstick  Result Value Ref Range   Color, UA light yellow (A) yellow   Clarity, UA cloudy (A) clear   Glucose, UA negative negative mg/dL   Bilirubin, UA negative negative   Ketones, POC UA negative negative mg/dL   Spec Grav, UA 1.010 1.010 - 1.025   Blood, UA negative negative   pH, UA 7.0 5.0 - 8.0   Protein Ur, POC negative negative mg/dL   Urobilinogen, UA 0.2 0.2 or 1.0 E.U./dL   Nitrite, UA Negative Negative   Leukocytes, UA Negative Negative  POCT urine pregnancy  Result Value Ref Range   Preg Test, Ur Negative Negative     Assessment and Plan: Kendra Nguyen is a 37 y.o. female who is here today for cc of  Chief Complaint  Patient presents with  . Vaginal Discharge    possible yeast infection  Cells to clue cells are not consistent with bacterial vaginosis.  I will treat her for yeast infection.  I have also given her a short stent of the nystatin triamcinolone the next few days to get some of the inflammation of the external genitalia.  Return to clinic as needed. Vaginal discharge - Plan: POCT Wet + KOH Prep, POCT urinalysis dipstick, POCT urine pregnancy, nystatin-triamcinolone ointment (MYCOLOG), fluconazole (DIFLUCAN) 150 MG tablet, CANCELED: POCT Microscopic Urinalysis ([facility])  Vaginal irritation - Plan: GC/Chlamydia Probe  Amp, GC/Chlamydia Probe Amp  Ivar Drape, PA-C [facility] 3/29/20198:28 AM

## 2017-04-23 ENCOUNTER — Encounter: Payer: Self-pay | Admitting: Physician Assistant

## 2017-07-09 ENCOUNTER — Ambulatory Visit: Payer: BC Managed Care – PPO | Admitting: Emergency Medicine

## 2017-07-09 ENCOUNTER — Encounter: Payer: Self-pay | Admitting: Emergency Medicine

## 2017-07-09 VITALS — BP 125/76 | HR 90 | Temp 97.7°F | Resp 17 | Ht 66.5 in | Wt 332.0 lb

## 2017-07-09 DIAGNOSIS — Z23 Encounter for immunization: Secondary | ICD-10-CM | POA: Diagnosis not present

## 2017-07-09 DIAGNOSIS — Z8619 Personal history of other infectious and parasitic diseases: Secondary | ICD-10-CM | POA: Diagnosis not present

## 2017-07-09 DIAGNOSIS — Z298 Encounter for other specified prophylactic measures: Secondary | ICD-10-CM

## 2017-07-09 DIAGNOSIS — Z7189 Other specified counseling: Secondary | ICD-10-CM

## 2017-07-09 DIAGNOSIS — Z7184 Encounter for health counseling related to travel: Secondary | ICD-10-CM

## 2017-07-09 DIAGNOSIS — R4582 Worries: Secondary | ICD-10-CM

## 2017-07-09 MED ORDER — DOXYCYCLINE HYCLATE 100 MG PO TABS: 100.0000 mg | ORAL_TABLET | Freq: Every day | ORAL | 1 refills | Status: DC

## 2017-07-09 MED ORDER — TYPHOID VACCINE PO CPDR
1.0000 | DELAYED_RELEASE_CAPSULE | ORAL | 0 refills | Status: DC
Start: 2017-07-09 — End: 2017-08-10

## 2017-07-09 MED ORDER — CIPROFLOXACIN HCL 500 MG PO TABS: 500.0000 mg | ORAL_TABLET | Freq: Two times a day (BID) | ORAL | 0 refills | Status: AC

## 2017-07-09 NOTE — Progress Notes (Signed)
Kendra Nguyen 37 y.o.   Chief Complaint  Patient presents with  . going on vacation    to [place] wants medicine     HISTORY OF PRESENT ILLNESS: This is a 37 y.o. female traveling to [place], [place] 1 week from today.  Needs preventive medications.  Up-to-date with Tdap, MMR, and hepatitis.  She usually gets protection for typhoid, malaria, and Travelers diarrhea.  HPI   Prior to Admission medications   Not on File    Allergies  Allergen Reactions  . Tape     rash  . Neomycin Rash    Patient Active Problem List   Diagnosis Date Noted  . Tenosynovitis, de Quervain 04/09/2016  . Pre-eclampsia affecting pregnancy, antepartum 01/17/2016  . Preeclampsia, third trimester 01/17/2016  . S/P gastric bypass 12/18/2014  . 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 Pure hypercholesterolemia 12/15/2013  . Vitamin D deficiency 12/15/2013  . Morbid obesity ([place]) 03/21/2011    Past Medical History:  Diagnosis Date  . Hx of laparoscopic gastric banding   . Hyperlipemia   . Hypertension    was taken of HTN meds by PCP "years ago"  . Morbid obesity with BMI of 45.0-49.9, adult ([place])   . Varicose veins     Past Surgical History:  Procedure Laterality Date  . CESAREAN SECTION N/A 01/18/2016   Procedure: CESAREAN SECTION;  Surgeon: Everett Graff, MD;  Location: [facility];  Service: Obstetrics;  Laterality: N/A;  . COLONOSCOPY WITH PROPOFOL N/A 11/24/2014   Procedure: COLONOSCOPY WITH PROPOFOL;  Surgeon: Milus Banister, MD;  [facility];  Service: Endoscopy;  Laterality: N/A;  . LAPAROSCOPIC GASTRIC BANDING  04/17/2011  . [place] RESECTION     2016  . WISDOM TOOTH EXTRACTION      Social History   Socioeconomic History  . Marital status: Single    Spouse name: Not on file  . Number of children: Not on file  . Years of education: Master's  . Highest education level: Not on file  Occupational History  .  Occupation: Pharmacist, hospital- middle school  Social Needs  . Financial resource strain: Not on file  . Food insecurity:    Worry: Not on file    Inability: Not on file  . Transportation needs:    Medical: Not on file    Non-medical: Not on file  Tobacco Use  . Smoking status: Never Smoker  . Smokeless tobacco: Never Used  Substance and Sexual Activity  . Alcohol use: No  . Drug use: No  . Sexual activity: Not on file  Lifestyle  . Physical activity:    Days per week: Not on file    Minutes per session: Not on file  . Stress: Not on file  Relationships  . Social connections:    Talks on phone: Not on file    Gets together: Not on file    Attends religious service: Not on file    Active member of club or organization: Not on file    Attends meetings of clubs or organizations: Not on file    Relationship status: Not on file  . Intimate partner violence:    Fear of current or ex partner: Not on file    Emotionally abused: Not on file    Physically abused: Not on file    Forced sexual activity: Not on file  Other Topics Concern  . Not on file  Social History Narrative   Lives at home by herself.  Caffeine use: Soda occass [name])    Family History  Problem Relation Age of Onset  . Diabetes Mother   . Heart disease Mother   . Arthritis Father        gout  . COPD Father        smoker  . Hyperlipidemia Father   . Cancer Father   . Aneurysm Sister 34       cerebral;   . COPD Paternal 60   . Neuropathy Neg Hx      Review of Systems  Constitutional: Negative.  Negative for chills and fever.  HENT: Negative.  Negative for sore throat.   Eyes: Negative.   Respiratory: Negative.  Negative for cough and shortness of breath.   Cardiovascular: Negative.  Negative for chest pain and palpitations.  Gastrointestinal: Negative.  Negative for abdominal pain, diarrhea, nausea and vomiting.  Genitourinary: Negative.  Negative for dysuria.  Skin: Negative.  Negative for rash.   Neurological: Negative.  Negative for dizziness and headaches.  Endo/Heme/Allergies: Negative.   All other systems reviewed and are negative.   Vitals:   07/09/17 1643  BP: 125/76  Pulse: 90  Resp: 17  Temp: 97.7 F (36.5 C)  SpO2: 98%    Physical Exam  Constitutional: She is oriented to person, place, and time. She appears well-developed.  HENT:  Head: Normocephalic and atraumatic.  Eyes: Pupils are equal, round, and reactive to light. EOM are normal.  Neck: Normal range of motion. Neck supple.  Cardiovascular: Normal rate and regular rhythm.  Pulmonary/Chest: Effort normal and breath sounds normal.  Neurological: She is alert and oriented to person, place, and time.  Skin: Skin is warm and dry. Capillary refill takes less than 2 seconds.  Psychiatric: She has a normal mood and affect. Her behavior is normal.     ASSESSMENT & PLAN: Kendra Nguyen was seen today for going on vacation.  Diagnoses and all orders for this visit:  Worries  Encounter for counseling for travel -     ciprofloxacin (CIPRO) 500 MG tablet; Take 1 tablet (500 mg total) by mouth 2 (two) times daily for 7 days.  Need for malaria prophylaxis -     doxycycline (VIBRA-TABS) 100 MG tablet; Take 1 tablet (100 mg total) by mouth daily. Start 2 days before traveling, daily while abroad, and daily for 4 weeks after returning.  Need for immunization against typhoid -     typhoid (VIVOTIF) DR capsule; Take 1 capsule by mouth every other day.  H/O traveler's diarrhea -     ciprofloxacin (CIPRO) 500 MG tablet; Take 1 tablet (500 mg total) by mouth 2 (two) times daily for 7 days.    Patient Instructions       IF you received an x-ray today, you will receive an invoice from [facility]. Please contact [facility] at [phone] with questions or concerns regarding your invoice.   IF you received labwork today, you will receive an invoice from [facility]. Please contact [facility] at  [phone] with questions or concerns regarding your invoice.   Our billing staff will not be able to assist you with questions regarding bills from these companies.  You will be contacted with the lab results as soon as they are available. The fastest way to get your results is to activate your My Chart account. Instructions are located on the last page of this paperwork. If you have not heard from [place] regarding the results in 2 weeks, please contact this office.  TravelVaccine Information Vaccines, also called immunizations, can protect you from certain diseases. Vaccines can also prevent the spread of certain infections. It is important to see your health care provider or a travel medicine specialist 4-6 weeks before you travel. This allows time for recommended vaccines to take effect. It also provides enough time for you to get vaccines that must be given in a series over a period of days or weeks. Vaccines for travelers include:  Routine vaccines. These vaccines are standard.  Recommended travel vaccines. These vaccines are generally recommended before international travel.  Geographically required travel vaccines. These vaccines are necessary before travel to some countries or regions.  If it is less than 4 weeks before you leave, you should still see your health care provider. You might still benefit from vaccines or medicines. What are routine vaccines? Routine vaccines are shots that can protect you from common diseases in many parts of the world. Most routine vaccines are given at certain ages starting in childhood. Routine vaccines also include the annual flu (influenza) vaccine. It is important that you are up to date on your routine vaccines before you travel. You may be advised to get extra doses, also called booster vaccines, such as the Tdap (tetanus, diphtheria, and pertussis). What are recommended vaccines? Recommended travel vaccines change over time. Your health care  provider can tell you what vaccines are recommended before your trip. The most common recommended vaccines before travel are hepatitis A and typhoid vaccines. Know your travel schedule when you visit your health care provider. The vaccines that are recommended before foreign travel will depend on several factors, such as:  The country or countries of travel.  Whether you will be traveling to rural areas.  How long you will be traveling.  The season of the year.  Your age. Older adults should get a vaccine against a certain type of pneumonia (pneumococcal) and a vaccine against shingles (herpes zoster).  Your health status.  Your previous vaccines.  The annual influenza vaccine sometimes differs for the [place] and [place] hemispheres. You should:  Get both vaccines if you are traveling to the other hemisphere, and you have a chronic medical condition.  Get the vaccine shortly before or during the flu season, and only if the vaccine in your country differs from the vaccine in your destination country.  Get the other influenza vaccine either before leaving the country or shortly after arriving at the destination country.  What are geographically required vaccines? Children should be up to date with all of the recommended vaccinations. Parents should follow the standard vaccination guidelines that are recommended by the pediatrician. Some vaccines may be required during an ongoing outbreak of an infectious disease in a country or region. Your health care provider will be able to tell you about any outbreaks and required vaccines. Some examples of required vaccines include: Yellow fever vaccine  Proof of yellow fever immunization is currently required for most people before traveling to certain countries in [place] and [place].  If proof of immunization is incomplete or inaccurate, you could be quarantined, denied entry, or given another dose of vaccine at the travel site.  This  vaccine can only be obtained at approved centers.  You should get the yellow fever vaccine at least 10 days before your trip.  After 10 days, most people show immunity to yellow fever.  If it has been longer than 10 years since you received the yellow fever vaccine, another dose is required.  Meningococcal  vaccine  Meningococcal immunization may be required prior to travel to parts of [place] and [place].  Proof of meningococcal immunization is required by the [place] for any person older than age 69 who is taking part in [place] or [place].  Visas for traveling to take part in hajj or umrah will not even be issued until there is proof of immunization. You should get this vaccine at least 10 days before your trip.  After 10 days, most people show immunity.  If it has been longer than 3 years since your last immunization, another dose is required.  Some travel circumstances may require additional vaccination with the following vaccines:  Hepatitis B.  Rabies.  Tick-borne encephalitis.  Malaria.  Where to find more information:  Centers for Disease Control and Prevention (CDC): http://www.wolf.info/  World Health Organization [facility]): RoleLink.com.br This information is not intended to replace advice given to you by your health care provider. Make sure you discuss any questions you have with your health care provider. Document Released: 12/26/2008 Document Revised: 10/03/2015 Document Reviewed: 06/13/2015 Elsevier Interactive Patient Education  2018 Elsevier Inc.      Agustina Caroli, MD [facility]

## 2017-07-09 NOTE — Patient Instructions (Addendum)
IF you received an x-ray today, you will receive an invoice from [facility]. Please contact [facility] at [phone] with questions or concerns regarding your invoice.   IF you received labwork today, you will receive an invoice from [facility]. Please contact [facility] at [phone] with questions or concerns regarding your invoice.   Our billing staff will not be able to assist you with questions regarding bills from these companies.  You will be contacted with the lab results as soon as they are available. The fastest way to get your results is to activate your My Chart account. Instructions are located on the last page of this paperwork. If you have not heard from [place] regarding the results in 2 weeks, please contact this office.     TravelVaccine Information Vaccines, also called immunizations, can protect you from certain diseases. Vaccines can also prevent the spread of certain infections. It is important to see your health care provider or a travel medicine specialist 4-6 weeks before you travel. This allows time for recommended vaccines to take effect. It also provides enough time for you to get vaccines that must be given in a series over a period of days or weeks. Vaccines for travelers include:  Routine vaccines. These vaccines are standard.  Recommended travel vaccines. These vaccines are generally recommended before international travel.  Geographically required travel vaccines. These vaccines are necessary before travel to some countries or regions.  If it is less than 4 weeks before you leave, you should still see your health care provider. You might still benefit from vaccines or medicines. What are routine vaccines? Routine vaccines are shots that can protect you from common diseases in many parts of the world. Most routine vaccines are given at certain ages starting in childhood. Routine vaccines also include the annual flu (influenza) vaccine. It  is important that you are up to date on your routine vaccines before you travel. You may be advised to get extra doses, also called booster vaccines, such as the Tdap (tetanus, diphtheria, and pertussis). What are recommended vaccines? Recommended travel vaccines change over time. Your health care provider can tell you what vaccines are recommended before your trip. The most common recommended vaccines before travel are hepatitis A and typhoid vaccines. Know your travel schedule when you visit your health care provider. The vaccines that are recommended before foreign travel will depend on several factors, such as:  The country or countries of travel.  Whether you will be traveling to rural areas.  How long you will be traveling.  The season of the year.  Your age. Older adults should get a vaccine against a certain type of pneumonia (pneumococcal) and a vaccine against shingles (herpes zoster).  Your health status.  Your previous vaccines.  The annual influenza vaccine sometimes differs for the [place] and [place] hemispheres. You should:  Get both vaccines if you are traveling to the other hemisphere, and you have a chronic medical condition.  Get the vaccine shortly before or during the flu season, and only if the vaccine in your country differs from the vaccine in your destination country.  Get the other influenza vaccine either before leaving the country or shortly after arriving at the destination country.  What are geographically required vaccines? Children should be up to date with all of the recommended vaccinations. Parents should follow the standard vaccination guidelines that are recommended by the pediatrician. Some vaccines may be required during an ongoing outbreak of an infectious disease in  a country or region. Your health care provider will be able to tell you about any outbreaks and required vaccines. Some examples of required vaccines include: Yellow fever  vaccine  Proof of yellow fever immunization is currently required for most people before traveling to certain countries in [place] and [place].  If proof of immunization is incomplete or inaccurate, you could be quarantined, denied entry, or given another dose of vaccine at the travel site.  This vaccine can only be obtained at approved centers.  You should get the yellow fever vaccine at least 10 days before your trip.  After 10 days, most people show immunity to yellow fever.  If it has been longer than 10 years since you received the yellow fever vaccine, another dose is required.  Meningococcal vaccine  Meningococcal immunization may be required prior to travel to parts of [place] and [place].  Proof of meningococcal immunization is required by the [place] for any person older than age 10 who is taking part in [place] or [place].  Visas for traveling to take part in hajj or umrah will not even be issued until there is proof of immunization. You should get this vaccine at least 10 days before your trip.  After 10 days, most people show immunity.  If it has been longer than 3 years since your last immunization, another dose is required.  Some travel circumstances may require additional vaccination with the following vaccines:  Hepatitis B.  Rabies.  Tick-borne encephalitis.  Malaria.  Where to find more information:  Centers for Disease Control and Prevention (CDC): http://www.wolf.info/  World Health Organization [facility]): RoleLink.com.br This information is not intended to replace advice given to you by your health care provider. Make sure you discuss any questions you have with your health care provider. Document Released: 12/26/2008 Document Revised: 10/03/2015 Document Reviewed: 06/13/2015 Elsevier Interactive Patient Education  [employer].

## 2017-07-24 ENCOUNTER — Encounter (HOSPITAL_COMMUNITY): Payer: Self-pay | Admitting: Urgent Care

## 2017-07-24 ENCOUNTER — Other Ambulatory Visit: Payer: Self-pay

## 2017-07-24 ENCOUNTER — Ambulatory Visit (HOSPITAL_COMMUNITY)
Admission: EM | Admit: 2017-07-24 | Discharge: 2017-07-24 | Disposition: A | Discharge status: Home / Self Care | Payer: BC Managed Care – PPO

## 2017-07-24 DIAGNOSIS — R109 Unspecified abdominal pain: Secondary | ICD-10-CM

## 2017-07-24 DIAGNOSIS — Z3202 Encounter for pregnancy test, result negative: Secondary | ICD-10-CM | POA: Diagnosis not present

## 2017-07-24 DIAGNOSIS — R197 Diarrhea, unspecified: Secondary | ICD-10-CM | POA: Diagnosis not present

## 2017-07-24 DIAGNOSIS — R110 Nausea: Secondary | ICD-10-CM

## 2017-07-24 DIAGNOSIS — K529 Noninfective gastroenteritis and colitis, unspecified: Secondary | ICD-10-CM

## 2017-07-24 LAB — POCT URINALYSIS DIP (DEVICE)
Bilirubin Urine: NEGATIVE
GLUCOSE, UA: NEGATIVE mg/dL
Hgb urine dipstick: NEGATIVE
KETONES UR: NEGATIVE mg/dL
Leukocytes, UA: NEGATIVE
NITRITE: NEGATIVE
PH: 6.5 (ref 5.0–8.0)
PROTEIN: NEGATIVE mg/dL
Specific Gravity, Urine: <=1.005 (ref 1.005–1.030)
Urobilinogen, UA: 0.2 mg/dL (ref 0.0–1.0)

## 2017-07-24 LAB — POCT PREGNANCY, URINE: PREG TEST UR: NEGATIVE

## 2017-07-24 MED ORDER — ONDANSETRON 8 MG PO TBDP: 8.0000 mg | ORAL_TABLET | Freq: Three times a day (TID) | ORAL | 0 refills | Status: DC | PRN

## 2017-07-24 NOTE — Discharge Instructions (Addendum)
Hydrate well with at least 2 liters (1 gallon) of water daily. Eat light meals including soups, chicken and noodle, wild rice, vegetable, salads and fruits. You can also take Gatorade, Powerade. If you continue to have diarrhea, try Imodium (loperamide) 2mg  once daily until you stop having diarrhea. Try otc Cortizone.

## 2017-07-24 NOTE — ED Provider Notes (Signed)
MRN: [id number] DOB: [birth year]  Subjective:   Kendra Nguyen is a 37 y.o. female presenting for 2 day history of progressively worsening nausea, belly and muscle cramping, 3-4 loose stools, headaches. Patient does a lot of [place] work, travels to [place] for this. Patient took [place] prior to travel in June. She also tried doxcycline but stopped this. She just started taking ciprofloxacin yesterday.  Fever, vomiting, bloody stools, abdominal pain (just states that it is cramping).  She does admit that her stools more formed today but just wanted to make sure she got checked out.  No current facility-administered medications for this encounter.   Current Outpatient Medications:  .  Ciprofloxacin (CIPRO PO), Take by mouth 2 (two) times daily., Disp: , Rfl:  .  typhoid (VIVOTIF) DR capsule, Take 1 capsule by mouth every other day., Disp: 4 capsule, Rfl: 0    Allergies  Allergen Reactions  . Tape     rash  . Neomycin Rash    Past Medical History:  Diagnosis Date  . Hx of laparoscopic gastric banding   . Hyperlipemia   . Hypertension    was taken of HTN meds by PCP "years ago"  . Morbid obesity with BMI of 45.0-49.9, adult ([place])   . Varicose veins      Past Surgical History:  Procedure Laterality Date  . CESAREAN SECTION N/A 01/18/2016   Procedure: CESAREAN SECTION;  Surgeon: Everett Graff, MD;  Location: [facility];  Service: Obstetrics;  Laterality: N/A;  . COLONOSCOPY WITH PROPOFOL N/A 11/24/2014   Procedure: COLONOSCOPY WITH PROPOFOL;  Surgeon: Milus Banister, MD;  Location: [facility];  Service: Endoscopy;  Laterality: N/A;  . LAPAROSCOPIC GASTRIC BANDING  04/17/2011  . [place] RESECTION     2016  . WISDOM TOOTH EXTRACTION      Objective:   Vitals: BP 130/90 (BP Location: Left Arm) Comment (BP Location): regular cuff, forearm  Pulse 82   Temp 98.8 F (37.1 C) (Oral)   Resp (!) 22   LMP 07/10/2017   SpO2 100%   Physical Exam   Constitutional: She is oriented to person, place, and time. She appears well-developed and well-nourished.  Cardiovascular: Normal rate, regular rhythm and intact distal pulses. Exam reveals no gallop and no friction rub.  No murmur heard. Pulmonary/Chest: No respiratory distress. She has no wheezes. She has no rales.  Abdominal: Soft. Bowel sounds are normal. She exhibits no distension and no mass. There is no tenderness. There is no rebound and no guarding.  Musculoskeletal: She exhibits no edema.  Neurological: She is alert and oriented to person, place, and time.  Skin: Skin is warm and dry. No rash noted. No erythema. No pallor.  Psychiatric: She has a normal mood and affect.    Results for orders placed or performed during the hospital encounter of 07/24/17 (from the past 24 hour(s))  POCT urinalysis dip (device)     Status: None   Collection Time: 07/24/17  1:44 PM  Result Value Ref Range   Glucose, UA NEGATIVE NEGATIVE mg/dL   Bilirubin Urine NEGATIVE NEGATIVE   Ketones, ur NEGATIVE NEGATIVE mg/dL   Specific Gravity, Urine <=1.005 1.005 - 1.030   Hgb urine dipstick NEGATIVE NEGATIVE   pH 6.5 5.0 - 8.0   Protein, ur NEGATIVE NEGATIVE mg/dL   Urobilinogen, UA 0.2 0.0 - 1.0 mg/dL   Nitrite NEGATIVE NEGATIVE   Leukocytes, UA NEGATIVE NEGATIVE  Pregnancy, urine POC     Status: None  Collection Time: 07/24/17  1:46 PM  Result Value Ref Range   Preg Test, Ur NEGATIVE NEGATIVE    Assessment and Plan :   Diarrhea, unspecified type  Nausea without vomiting  Gastroenteritis  Manage supportively for viral gastroenteritis, traveler's diarrhea.  Counseled on pushing fluids and using Zofran for her nausea which may also help slow her bowel movements soon.  Return to clinic precautions discussed.  She was agreeable to stopping ciprofloxacin which I counseled might make her GI symptoms worse.   Jaynee Eagles, [place] 07/24/17 [id number]

## 2017-07-24 NOTE — ED Triage Notes (Signed)
Intermittent cramping, diarrhea, nausea and headaches-onset Tuesday.  Returned from [place] Monday night

## 2017-08-10 ENCOUNTER — Encounter (HOSPITAL_COMMUNITY): Payer: Self-pay

## 2017-08-10 ENCOUNTER — Ambulatory Visit (HOSPITAL_COMMUNITY)
Admission: EM | Admit: 2017-08-10 | Discharge: 2017-08-10 | Disposition: A | Discharge status: Home / Self Care | Payer: BC Managed Care – PPO | Attending: Family Medicine | Admitting: Family Medicine

## 2017-08-10 DIAGNOSIS — B9789 Other viral agents as the cause of diseases classified elsewhere: Secondary | ICD-10-CM

## 2017-08-10 DIAGNOSIS — J069 Acute upper respiratory infection, unspecified: Secondary | ICD-10-CM

## 2017-08-10 MED ORDER — BENZONATATE 200 MG PO CAPS: 200.0000 mg | ORAL_CAPSULE | Freq: Two times a day (BID) | ORAL | 0 refills | Status: DC | PRN

## 2017-08-10 NOTE — Discharge Instructions (Signed)
Take mucinex DM for the cough and congestion This will help loosen the mucous In addition may use tessalon for cough Drink plenty of water May use a saline nasal spray Consider humidifier for the bedroom Expect improvement in a few days

## 2017-08-10 NOTE — ED Triage Notes (Signed)
Pt presents with complaints of cough and earache x 2 days.

## 2017-08-10 NOTE — ED Provider Notes (Signed)
[facility]    CSN: [id number] Arrival date & time: 08/10/17  1006     History   Chief Complaint Chief Complaint  Patient presents with  . Cough    HPI Kendra Nguyen is a 37 y.o. female.   HPI  Patient is here for a cough.  She also has sinus drainage, postnasal drip, mild sore throat.  Runny and stuffy nose.  Ear pressure and pain.  Mild headache.  No fever or chills.  She is coughing up scant thick sputum.  Sometimes "yellow chunks".  No chest pain.  No wheezing.  No sweats, chills, fever, or fatigue.  No underlying asthma or lung disease.  Past Medical History:  Diagnosis Date  . Hx of laparoscopic gastric banding   . Hyperlipemia   . Hypertension    was taken of HTN meds by PCP "years ago"  . Morbid obesity with BMI of 45.0-49.9, adult ([place])   . Varicose veins     Patient Active Problem List   Diagnosis Date Noted  . Tenosynovitis, de Quervain 04/09/2016  . Pre-eclampsia affecting pregnancy, antepartum 01/17/2016  . Preeclampsia, third trimester 01/17/2016  . S/P gastric bypass 12/18/2014  . 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 Pure hypercholesterolemia 12/15/2013  . Vitamin D deficiency 12/15/2013  . Morbid obesity ([place]) 03/21/2011    Past Surgical History:  Procedure Laterality Date  . CESAREAN SECTION N/A 01/18/2016   Procedure: CESAREAN SECTION;  Surgeon: Everett Graff, MD;  Location: [facility];  Service: Obstetrics;  Laterality: N/A;  . COLONOSCOPY WITH PROPOFOL N/A 11/24/2014   Procedure: COLONOSCOPY WITH PROPOFOL;  Surgeon: Milus Banister, MD;  Location: [facility];  Service: Endoscopy;  Laterality: N/A;  . LAPAROSCOPIC GASTRIC BANDING  04/17/2011  . [place] RESECTION     2016  . WISDOM TOOTH EXTRACTION      OB History    Gravida  1   Para  1   Term  1   Preterm      AB      Living  1     SAB      TAB      Ectopic      Multiple  0   Live Births  1            Home Medications    Prior to Admission medications   Medication Sig Start Date End Date Taking? Authorizing Provider  benzonatate (TESSALON) 200 MG capsule Take 1 capsule (200 mg total) by mouth 2 (two) times daily as needed for cough. 08/10/17   Raylene Everts, MD    Family History Family History  Problem Relation Age of Onset  . Diabetes Mother   . Heart disease Mother   . Arthritis Father        gout  . COPD Father        smoker  . Hyperlipidemia Father   . Cancer Father   . Aneurysm Sister 34       cerebral;   . COPD Paternal 31   . Neuropathy Neg Hx     Social History Social History   Tobacco Use  . Smoking status: Never Smoker  . Smokeless tobacco: Never Used  Substance Use Topics  . Alcohol use: No  . Drug use: No     Allergies   Tape and Neomycin   Review of Systems Review of Systems  Constitutional: Negative for chills, fatigue and fever.  HENT: Positive for congestion,  postnasal drip, rhinorrhea, sinus pressure and sore throat. Negative for dental problem and ear pain.   Eyes: Negative for pain and visual disturbance.  Respiratory: Negative for cough and shortness of breath.   Cardiovascular: Negative for chest pain and palpitations.  Gastrointestinal: Negative for abdominal pain, nausea and vomiting.  Genitourinary: Negative for dysuria and hematuria.  Musculoskeletal: Negative for arthralgias and back pain.  Skin: Negative for color change and rash.  Neurological: Positive for headaches. Negative for seizures and syncope.  Psychiatric/Behavioral: Negative for sleep disturbance. The patient is not nervous/anxious.   All other systems reviewed and are negative.    Physical Exam Triage Vital Signs ED Triage Vitals [08/10/17 1029]  Enc Vitals Group     BP (!) 142/95     Pulse Rate 86     Resp 18     Temp 98.5 F (36.9 C)     Temp src      SpO2 100 %     Weight      Height      Head Circumference      Peak Flow      Pain  Score 2     Pain Loc      Pain Edu?      Excl. in [place]?    No data found.  Updated Vital Signs BP (!) 142/95   Pulse 86   Temp 98.5 F (36.9 C)   Resp 18   LMP 08/03/2017   SpO2 100%     Physical Exam  Constitutional: She is oriented to person, place, and time. She appears well-developed and well-nourished. No distress.  HENT:  Head: Normocephalic and atraumatic.  Right Ear: External ear normal.  Left Ear: External ear normal.  Mouth/Throat: Oropharynx is clear and moist.  Clear rhinorrhea.  No sinus tenderness.  Eyes: Pupils are equal, round, and reactive to light. Conjunctivae are normal.  Neck: Normal range of motion.  Cardiovascular: Normal rate, regular rhythm and normal heart sounds.  Pulmonary/Chest: Effort normal. No respiratory distress.  Anterior rhonchi that clear with cough  Abdominal: Soft. She exhibits no distension.  Musculoskeletal: Normal range of motion. She exhibits no edema.  Lymphadenopathy:    She has cervical adenopathy.  Neurological: She is alert and oriented to person, place, and time.  Skin: Skin is warm and dry.  Psychiatric: She has a normal mood and affect. Her behavior is normal.     UC Treatments / Results  Labs (all labs ordered are listed, but only abnormal results are displayed) Labs Reviewed - No data to display  EKG None  Radiology No results found.  Procedures Procedures (including critical care time)  Medications Ordered in UC Medications - No data to display  Initial Impression / Assessment and Plan / UC Course  I have reviewed the triage vital signs and the nursing notes.  Pertinent labs & imaging results that were available during my care of the patient were reviewed by me and considered in my medical decision making (see chart for details).     Discussed viral illness.  Discussed no need for antibiotics.  Discussed symptomatic care.  Discussed reason for returns Final Clinical Impressions(s) / UC Diagnoses    Final diagnoses:  Viral URI with cough     Discharge Instructions     Take mucinex DM for the cough and congestion This will help loosen the mucous In addition may use tessalon for cough Drink plenty of water May use a saline nasal spray Consider humidifier for  the bedroom Expect improvement in a few days   ED Prescriptions    Medication Sig Dispense Auth. Provider   benzonatate (TESSALON) 200 MG capsule Take 1 capsule (200 mg total) by mouth 2 (two) times daily as needed for cough. 20 capsule Raylene Everts, MD     Controlled Substance Prescriptions [place] Controlled Substance Registry consulted? Not Applicable   Raylene Everts, MD 08/10/17 1924

## 2017-08-21 ENCOUNTER — Encounter: Payer: Self-pay | Admitting: Physician Assistant

## 2017-08-21 ENCOUNTER — Ambulatory Visit: Payer: BC Managed Care – PPO | Admitting: Physician Assistant

## 2017-08-21 ENCOUNTER — Other Ambulatory Visit: Payer: Self-pay

## 2017-08-21 VITALS — BP 129/75 | HR 85 | Temp 98.0°F | Resp 17 | Ht 66.5 in | Wt 335.4 lb

## 2017-08-21 DIAGNOSIS — J309 Allergic rhinitis, unspecified: Secondary | ICD-10-CM | POA: Diagnosis not present

## 2017-08-21 MED ORDER — PREDNISONE 20 MG PO TABS
ORAL_TABLET | ORAL | 0 refills | Status: DC
Start: 2017-08-21 — End: 2017-09-16

## 2017-08-21 MED ORDER — FLUTICASONE PROPIONATE 50 MCG/ACT NA SUSP: 2.0000 | Freq: Every day | NASAL | 6 refills | Status: DC

## 2017-08-21 NOTE — Progress Notes (Signed)
Kendra Nguyen  MRN: [id number] DOB: 1980/09/21  PCP: Horald Pollen, MD  Subjective:  Pt is a pleasant 36 year old female who presents to clinic for cough. She was seen two weeks ago at [facility] and dx with uri and given tessalon perles and mucinex. She is not improving. Endorses nasal drainage, cough and feeling like there's something in the back of her throat.  She thinks she may have allergies. Does not take anything for this.  ROS below.   Review of Systems  Constitutional: Negative for chills, diaphoresis, fatigue and fever.  HENT: Positive for congestion, postnasal drip, rhinorrhea and sore throat. Negative for sinus pressure and sinus pain.   Respiratory: Positive for cough. Negative for shortness of breath and wheezing.   Allergic/Immunologic: Positive for environmental allergies. Negative for food allergies.  Psychiatric/Behavioral: Negative for sleep disturbance.    Patient Active Problem List   Diagnosis Date Noted  . Tenosynovitis, de Quervain 04/09/2016  . Pre-eclampsia affecting pregnancy, antepartum 01/17/2016  . Preeclampsia, third trimester 01/17/2016  . S/P gastric bypass 12/18/2014  . 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 Pure hypercholesterolemia 12/15/2013  . Vitamin D deficiency 12/15/2013  . Morbid obesity ([place]) 03/21/2011    Current Outpatient Medications on File Prior to Visit  Medication Sig Dispense Refill  . drospirenone-ethinyl estradiol (YASMIN,ZARAH,SYEDA) 3-0.03 MG tablet Take 1 tablet by mouth daily.    . benzonatate (TESSALON) 200 MG capsule Take 1 capsule (200 mg total) by mouth 2 (two) times daily as needed for cough. (Patient not taking: Reported on 08/21/2017) 20 capsule 0   No current facility-administered medications on file prior to visit.     Allergies  Allergen Reactions  . Tape     rash  . Neomycin Rash     Objective:  BP 129/75 (BP Location: Right Arm, Patient Position: Sitting, Cuff Size:  Large)   Pulse 85   Temp 98 F (36.7 C) (Oral)   Resp 17   Ht 5' 6.5" (1.689 m)   Wt (!) 335 lb 6.4 oz (152.1 kg)   LMP 08/03/2017   SpO2 100%   BMI 53.32 kg/m   Physical Exam  Constitutional: She is oriented to person, place, and time. No distress.  HENT:  Right Ear: Tympanic membrane normal.  Left Ear: Tympanic membrane normal.  Nose: Mucosal edema present. No rhinorrhea. Right sinus exhibits no maxillary sinus tenderness and no frontal sinus tenderness. Left sinus exhibits no maxillary sinus tenderness and no frontal sinus tenderness.  Mouth/Throat: Oropharynx is clear and moist and mucous membranes are normal.  Cardiovascular: Normal rate, regular rhythm and normal heart sounds.  Pulmonary/Chest: Effort normal and breath sounds normal. No respiratory distress. She has no wheezes. She has no rales.  Neurological: She is alert and oriented to person, place, and time.  Skin: Skin is warm and dry.  Psychiatric: Judgment normal.  Vitals reviewed.  Assessment and Plan :  1. Allergic rhinitis, unspecified seasonality, unspecified trigger -Patient presents complaining of 3 weeks of cough, nasal congestion.  Suspect allergic rhinitis, untreated.  Discussed with patient need to control allergies with daily medication.  She would like referral to allergist.  Return to clinic if no improvement in 5 to 7 days - fluticasone (FLONASE) 50 MCG/ACT nasal spray; Place 2 sprays into both nostrils daily.  Dispense: 16 g; Refill: 6 - predniSONE (DELTASONE) 20 MG tablet; Take 3 PO QAM x2days, 2 PO QAM x2days, 1 PO QAM x2days  Dispense: 12 tablet; Refill:  0 - Ambulatory referral to Allergy  Mercer Pod, PA-C  [facility] 08/21/2017 5:40 PM  Please note: Portions of this report may have been transcribed using dragon voice recognition software. Every effort was made to ensure accuracy; however, inadvertent computerized transcription errors may be present.

## 2017-08-21 NOTE — Patient Instructions (Addendum)
Start taking prednisone: Take 3 pills in the morning (with food) x2 days, then 2 pills in the morning x2 days, then 1 pill in the morning x2 days. Start using Flonase daily -take this as directed. Start taking oral allergy medications daily. Try different over-the-counter allergy medications.  Some medications will work for you, others may not.  Claritin-D is also an option, you must buy this from your pharmacist.  You will receive a phone call to schedule an appointment with allergy.  Be sure to try several different allergy medications before your appointment. Consider acupuncture as an alternative therapy for your allergies.  Allergic Rhinitis, Adult Allergic rhinitis is an allergic reaction that affects the mucous membrane inside the nose. It causes sneezing, a runny or stuffy nose, and the feeling of mucus going down the back of the throat (postnasal drip). Allergic rhinitis can be mild to severe. There are two types of allergic rhinitis:  Seasonal. This type is also called hay fever. It happens only during certain seasons.  Perennial. This type can happen at any time of the year.  What are the causes? This condition happens when the body's defense system (immune system) responds to certain harmless substances called allergens as though they were germs.  Seasonal allergic rhinitis is triggered by pollen, which can come from grasses, trees, and weeds. Perennial allergic rhinitis may be caused by:  House dust mites.  Pet dander.  Mold spores.  What are the signs or symptoms? Symptoms of this condition include:  Sneezing.  Runny or stuffy nose (nasal congestion).  Postnasal drip.  Itchy nose.  Tearing of the eyes.  Trouble sleeping.  Daytime sleepiness.  How is this diagnosed? This condition may be diagnosed based on:  Your medical history.  A physical exam.  Tests to check for related conditions, such as: ? Asthma. ? Pink eye. ? Ear infection. ? Upper  respiratory infection.  Tests to find out which allergens trigger your symptoms. These may include skin or blood tests.  How is this treated? There is no cure for this condition, but treatment can help control symptoms. Treatment may include:  Taking medicines that block allergy symptoms, such as antihistamines. Medicine may be given as a shot, nasal spray, or pill.  Avoiding the allergen.  Desensitization. This treatment involves getting ongoing shots until your body becomes less sensitive to the allergen. This treatment may be done if other treatments do not help.  If taking medicine and avoiding the allergen does not work, new, stronger medicines may be prescribed.  Follow these instructions at home:  Find out what you are allergic to. Common allergens include smoke, dust, and pollen.  Avoid the things you are allergic to. These are some things you can do to help avoid allergens: ? Replace carpet with wood, tile, or vinyl flooring. Carpet can trap dander and dust. ? Do not smoke. Do not allow smoking in your home. ? Change your heating and air conditioning filter at least once a month. ? During allergy season:  Keep windows closed as much as possible.  Plan outdoor activities when pollen counts are lowest. This is usually during the evening hours.  When coming indoors, change clothing and shower before sitting on furniture or bedding.  Take over-the-counter and prescription medicines only as told by your health care provider.  Keep all follow-up visits as told by your health care provider. This is important. Contact a health care provider if:  You have a fever.  You develop a persistent  cough.  You make whistling sounds when you breathe (you wheeze).  Your symptoms interfere with your normal daily activities. Get help right away if:  You have shortness of breath. Summary  This condition can be managed by taking medicines as directed and avoiding  allergens.  Contact your health care provider if you develop a persistent cough or fever.  During allergy season, keep windows closed as much as possible. This information is not intended to replace advice given to you by your health care provider. Make sure you discuss any questions you have with your health care provider. Document Released: 10/02/2000 Document Revised: 02/15/2016 Document Reviewed: 02/15/2016 Elsevier Interactive Patient Education  [employer].

## 2017-08-26 ENCOUNTER — Other Ambulatory Visit: Payer: Self-pay

## 2017-08-26 ENCOUNTER — Ambulatory Visit (INDEPENDENT_AMBULATORY_CARE_PROVIDER_SITE_OTHER): Payer: BC Managed Care – PPO | Admitting: Emergency Medicine

## 2017-08-26 ENCOUNTER — Encounter: Payer: Self-pay | Admitting: Emergency Medicine

## 2017-08-26 VITALS — BP 142/83 | HR 68 | Temp 98.2°F | Resp 16 | Ht 66.0 in | Wt 331.0 lb

## 2017-08-26 DIAGNOSIS — Z0000 Encounter for general adult medical examination without abnormal findings: Secondary | ICD-10-CM

## 2017-08-26 NOTE — Patient Instructions (Addendum)
   IF you received an x-ray today, you will receive an invoice from [facility]. Please contact [facility] at [phone] with questions or concerns regarding your invoice.   IF you received labwork today, you will receive an invoice from [facility]. Please contact [facility] at [phone] with questions or concerns regarding your invoice.   Our billing staff will not be able to assist you with questions regarding bills from these companies.  You will be contacted with the lab results as soon as they are available. The fastest way to get your results is to activate your My Chart account. Instructions are located on the last page of this paperwork. If you have not heard from us regarding the results in 2 weeks, please contact this office.     Health Maintenance, Female Adopting a healthy lifestyle and getting preventive care can go a long way to promote health and wellness. Talk with your health care provider about what schedule of regular examinations is right for you. This is a good chance for you to check in with your provider about disease prevention and staying healthy. In between checkups, there are plenty of things you can do on your own. Experts have done a lot of research about which lifestyle changes and preventive measures are most likely to keep you healthy. Ask your health care provider for more information. Weight and diet Eat a healthy diet  Be sure to include plenty of vegetables, fruits, low-fat dairy products, and lean protein.  Do not eat a lot of foods high in solid fats, added sugars, or salt.  Get regular exercise. This is one of the most important things you can do for your health. ? Most adults should exercise for at least 150 minutes each week. The exercise should increase your heart rate and make you sweat (moderate-intensity exercise). ? Most adults should also do strengthening exercises at least twice a week. This is in addition to the  moderate-intensity exercise.  Maintain a healthy weight  Body mass index (BMI) is a measurement that can be used to identify possible weight problems. It estimates body fat based on height and weight. Your health care provider can help determine your BMI and help you achieve or maintain a healthy weight.  For females 20 years of age and older: ? A BMI below 18.5 is considered underweight. ? A BMI of 18.5 to 24.9 is normal. ? A BMI of 25 to 29.9 is considered overweight. ? A BMI of 30 and above is considered obese.  Watch levels of cholesterol and blood lipids  You should start having your blood tested for lipids and cholesterol at 37 years of age, then have this test every 5 years.  You may need to have your cholesterol levels checked more often if: ? Your lipid or cholesterol levels are high. ? You are older than 37 years of age. ? You are at high risk for heart disease.  Cancer screening Lung Cancer  Lung cancer screening is recommended for adults 55-80 years old who are at high risk for lung cancer because of a history of smoking.  A yearly low-dose CT scan of the lungs is recommended for people who: ? Currently smoke. ? Have quit within the past 15 years. ? Have at least a 30-pack-year history of smoking. A pack year is smoking an average of one pack of cigarettes a day for 1 year.  Yearly screening should continue until it has been 15 years since you quit.  Yearly   screening should stop if you develop a health problem that would prevent you from having lung cancer treatment.  Breast Cancer  Practice breast self-awareness. This means understanding how your breasts normally appear and feel.  It also means doing regular breast self-exams. Let your health care provider know about any changes, no matter how small.  If you are in your 20s or 30s, you should have a clinical breast exam (CBE) by a health care provider every 1-3 years as part of a regular health exam.  If you  are 73 or older, have a CBE every year. Also consider having a breast X-ray (mammogram) every year.  If you have a family history of breast cancer, talk to your health care provider about genetic screening.  If you are at high risk for breast cancer, talk to your health care provider about having an MRI and a mammogram every year.  Breast cancer gene (BRCA) assessment is recommended for women who have family members with BRCA-related cancers. BRCA-related cancers include: ? Breast. ? Ovarian. ? Tubal. ? Peritoneal cancers.  Results of the assessment will determine the need for genetic counseling and BRCA1 and BRCA2 testing.  Cervical Cancer Your health care provider may recommend that you be screened regularly for cancer of the pelvic organs (ovaries, uterus, and vagina). This screening involves a pelvic examination, including checking for microscopic changes to the surface of your cervix (Pap test). You may be encouraged to have this screening done every 3 years, beginning at age 61.  For women ages 90-65, health care providers may recommend pelvic exams and Pap testing every 3 years, or they may recommend the Pap and pelvic exam, combined with testing for human papilloma virus (HPV), every 5 years. Some types of HPV increase your risk of cervical cancer. Testing for HPV may also be done on women of any age with unclear Pap test results.  Other health care providers may not recommend any screening for nonpregnant women who are considered low risk for pelvic cancer and who do not have symptoms. Ask your health care provider if a screening pelvic exam is right for you.  If you have had past treatment for cervical cancer or a condition that could lead to cancer, you need Pap tests and screening for cancer for at least 20 years after your treatment. If Pap tests have been discontinued, your risk factors (such as having a new sexual partner) need to be reassessed to determine if screening should  resume. Some women have medical problems that increase the chance of getting cervical cancer. In these cases, your health care provider may recommend more frequent screening and Pap tests.  Colorectal Cancer  This type of cancer can be detected and often prevented.  Routine colorectal cancer screening usually begins at 37 years of age and continues through 37 years of age.  Your health care provider may recommend screening at an earlier age if you have risk factors for colon cancer.  Your health care provider may also recommend using home test kits to check for hidden blood in the stool.  A small camera at the end of a tube can be used to examine your colon directly (sigmoidoscopy or colonoscopy). This is done to check for the earliest forms of colorectal cancer.  Routine screening usually begins at age 67.  Direct examination of the colon should be repeated every 5-10 years through 37 years of age. However, you may need to be screened more often if early forms of precancerous polyps  or small growths are found.  Skin Cancer  Check your skin from head to toe regularly.  Tell your health care provider about any new moles or changes in moles, especially if there is a change in a mole's shape or color.  Also tell your health care provider if you have a mole that is larger than the size of a pencil eraser.  Always use sunscreen. Apply sunscreen liberally and repeatedly throughout the day.  Protect yourself by wearing long sleeves, pants, a wide-brimmed hat, and sunglasses whenever you are outside.  Heart disease, diabetes, and high blood pressure  High blood pressure causes heart disease and increases the risk of stroke. High blood pressure is more likely to develop in: ? People who have blood pressure in the high end of the normal range (130-139/85-89 mm Hg). ? People who are overweight or obese. ? People who are African American.  If you are 18-39 years of age, have your blood  pressure checked every 3-5 years. If you are 40 years of age or older, have your blood pressure checked every year. You should have your blood pressure measured twice-once when you are at a hospital or clinic, and once when you are not at a hospital or clinic. Record the average of the two measurements. To check your blood pressure when you are not at a hospital or clinic, you can use: ? An automated blood pressure machine at a pharmacy. ? A home blood pressure monitor.  If you are between 55 years and 79 years old, ask your health care provider if you should take aspirin to prevent strokes.  Have regular diabetes screenings. This involves taking a blood sample to check your fasting blood sugar level. ? If you are at a normal weight and have a low risk for diabetes, have this test once every three years after 37 years of age. ? If you are overweight and have a high risk for diabetes, consider being tested at a younger age or more often. Preventing infection Hepatitis B  If you have a higher risk for hepatitis B, you should be screened for this virus. You are considered at high risk for hepatitis B if: ? You were born in a country where hepatitis B is common. Ask your health care provider which countries are considered high risk. ? Your parents were born in a high-risk country, and you have not been immunized against hepatitis B (hepatitis B vaccine). ? You have HIV or AIDS. ? You use needles to inject street drugs. ? You live with someone who has hepatitis B. ? You have had sex with someone who has hepatitis B. ? You get hemodialysis treatment. ? You take certain medicines for conditions, including cancer, organ transplantation, and autoimmune conditions.  Hepatitis C  Blood testing is recommended for: ? Everyone born from 1945 through 1965. ? Anyone with known risk factors for hepatitis C.  Sexually transmitted infections (STIs)  You should be screened for sexually transmitted  infections (STIs) including gonorrhea and chlamydia if: ? You are sexually active and are younger than 37 years of age. ? You are older than 37 years of age and your health care provider tells you that you are at risk for this type of infection. ? Your sexual activity has changed since you were last screened and you are at an increased risk for chlamydia or gonorrhea. Ask your health care provider if you are at risk.  If you do not have HIV, but are at risk,   it may be recommended that you take a prescription medicine daily to prevent HIV infection. This is called pre-exposure prophylaxis (PrEP). You are considered at risk if: ? You are sexually active and do not regularly use condoms or know the HIV status of your partner(s). ? You take drugs by injection. ? You are sexually active with a partner who has HIV.  Talk with your health care provider about whether you are at high risk of being infected with HIV. If you choose to begin PrEP, you should first be tested for HIV. You should then be tested every 3 months for as long as you are taking PrEP. Pregnancy  If you are premenopausal and you may become pregnant, ask your health care provider about preconception counseling.  If you may become pregnant, take 400 to 800 micrograms (mcg) of folic acid every day.  If you want to prevent pregnancy, talk to your health care provider about birth control (contraception). Osteoporosis and menopause  Osteoporosis is a disease in which the bones lose minerals and strength with aging. This can result in serious bone fractures. Your risk for osteoporosis can be identified using a bone density scan.  If you are 34 years of age or older, or if you are at risk for osteoporosis and fractures, ask your health care provider if you should be screened.  Ask your health care provider whether you should take a calcium or vitamin D supplement to lower your risk for osteoporosis.  Menopause may have certain physical  symptoms and risks.  Hormone replacement therapy may reduce some of these symptoms and risks. Talk to your health care provider about whether hormone replacement therapy is right for you. Follow these instructions at home:  Schedule regular health, dental, and eye exams.  Stay current with your immunizations.  Do not use any tobacco products including cigarettes, chewing tobacco, or electronic cigarettes.  If you are pregnant, do not drink alcohol.  If you are breastfeeding, limit how much and how often you drink alcohol.  Limit alcohol intake to no more than 1 drink per day for nonpregnant women. One drink equals 12 ounces of beer, 5 ounces of wine, or 1 ounces of hard liquor.  Do not use street drugs.  Do not share needles.  Ask your health care provider for help if you need support or information about quitting drugs.  Tell your health care provider if you often feel depressed.  Tell your health care provider if you have ever been abused or do not feel safe at home. This information is not intended to replace advice given to you by your health care provider. Make sure you discuss any questions you have with your health care provider. Document Released: 07/23/2010 Document Revised: 06/15/2015 Document Reviewed: 10/11/2014 [employer]vier Interactive Patient Education  [employer].

## 2017-08-26 NOTE — Progress Notes (Signed)
Kendra Nguyen Nguyen 37 y.o.   Chief Complaint  Patient presents with  . Annual Exam    HISTORY OF PRESENT ILLNESS: This is a 37 y.o. female Here for annual exam; no complaints and no medical concerns. No chronic medical problems.  On no medications.  Non-smoker and non-EtOH user.  Nutrition and physical activity could be better.  No medical concerns or problems at this time.  HPI   Prior to Admission medications   Medication Sig Start Date End Date Taking? Authorizing Provider  drospirenone-ethinyl estradiol (YASMIN,ZARAH,SYEDA) 3-0.03 MG tablet Take 1 tablet by mouth daily.   Yes [provider]  fluticasone (FLONASE) 50 MCG/ACT nasal spray Place 2 sprays into both nostrils daily. 08/21/17  Yes McVey, Gelene Mink, PA-C  predniSONE (DELTASONE) 20 MG tablet Take 3 PO QAM x2days, 2 PO QAM x2days, 1 PO QAM x2days 08/21/17  Yes McVey, Gelene Mink, PA-C    Allergies  Allergen Reactions  . Tape     rash  . Neomycin Rash    Patient Active Problem List   Diagnosis Date Noted  . Tenosynovitis, de Quervain 04/09/2016  . Pre-eclampsia affecting pregnancy, antepartum 01/17/2016  . Preeclampsia, third trimester 01/17/2016  . S/P gastric bypass 12/18/2014  . 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 Pure hypercholesterolemia 12/15/2013  . Vitamin D deficiency 12/15/2013  . Morbid obesity ([place]) 03/21/2011    Past Medical History:  Diagnosis Date  . Hx of laparoscopic gastric banding   . Hyperlipemia   . Hypertension    was taken of HTN meds by PCP "years ago"  . Morbid obesity with BMI of 45.0-49.9, adult ([place])   . OCD (obsessive compulsive disorder) 04/2016  . Varicose veins     Past Surgical History:  Procedure Laterality Date  . CESAREAN SECTION N/A 01/18/2016   Procedure: CESAREAN SECTION;  Surgeon: Everett Graff, MD;  Location: [facility];  Service: Obstetrics;  Laterality: N/A;  . COLONOSCOPY WITH PROPOFOL N/A 11/24/2014    Procedure: COLONOSCOPY WITH PROPOFOL;  Surgeon: Milus Banister, MD;  Location: [facility];  Service: Endoscopy;  Laterality: N/A;  . LAPAROSCOPIC GASTRIC BANDING  04/17/2011  . [place] RESECTION     2016  . WISDOM TOOTH EXTRACTION      Social History   Socioeconomic History  . Marital status: Single    Spouse name: Not on file  . Number of children: Not on file  . Years of education: Master's  . Highest education level: Not on file  Occupational History  . Occupation: Pharmacist, hospital- middle school  Social Needs  . Financial resource strain: Not on file  . Food insecurity:    Worry: Not on file    Inability: Not on file  . Transportation needs:    Medical: Not on file    Non-medical: Not on file  Tobacco Use  . Smoking status: Never Smoker  . Smokeless tobacco: Never Used  Substance and Sexual Activity  . Alcohol use: No  . Drug use: No  . Sexual activity: Not on file  Lifestyle  . Physical activity:    Days per week: Not on file    Minutes per session: Not on file  . Stress: Not on file  Relationships  . Social connections:    Talks on phone: Not on file    Gets together: Not on file    Attends religious service: Not on file    Active member of club or organization: Not on file  Attends meetings of clubs or organizations: Not on file    Relationship status: Not on file  . Intimate partner violence:    Fear of current or ex partner: Not on file    Emotionally abused: Not on file    Physically abused: Not on file    Forced sexual activity: Not on file  Other Topics Concern  . Not on file  Social History Narrative   Lives at home by herself.   Caffeine use: Soda occass Kendra Nguyen Nguyen)    Family History  Problem Relation Age of Onset  . Diabetes Mother   . Heart disease Mother   . Arthritis Father        gout  . COPD Father        smoker  . Hyperlipidemia Father   . Cancer Father   . Aneurysm Sister 34       cerebral;   . COPD Paternal 5    . Neuropathy Neg Hx      Review of Systems  Constitutional: Negative.  Negative for chills and fever.  HENT: Negative.  Negative for congestion, hearing loss, nosebleeds and sore throat.   Eyes: Negative.  Negative for blurred vision and double vision.  Respiratory: Negative.  Negative for cough, hemoptysis and shortness of breath.   Cardiovascular: Negative.  Negative for chest pain, palpitations and leg swelling.  Gastrointestinal: Negative.  Negative for abdominal pain, blood in stool, diarrhea, melena, nausea and vomiting.  Genitourinary: Negative.  Negative for dysuria and hematuria.  Musculoskeletal: Negative.  Negative for back pain, myalgias and neck pain.  Skin: Negative.  Negative for rash.  Neurological: Negative.  Negative for dizziness and headaches.  All other systems reviewed and are negative.  Vitals:   08/26/17 1139  BP: (!) 142/83  Pulse: 68  Resp: 16  Temp: 98.2 F (36.8 C)  SpO2: 100%     Physical Exam  Constitutional: She is oriented to person, place, and time. She appears well-developed.  Morbidly obese  HENT:  Head: Normocephalic and atraumatic.  Right Ear: External ear normal.  Left Ear: External ear normal.  Nose: Nose normal.  Mouth/Throat: Oropharynx is clear and moist.  Eyes: Pupils are equal, round, and reactive to light. Conjunctivae and EOM are normal.  Neck: Normal range of motion. Neck supple. No thyromegaly present.  Cardiovascular: Normal rate, regular rhythm and normal heart sounds.  Pulmonary/Chest: Effort normal and breath sounds normal.  Abdominal: Soft. Bowel sounds are normal. She exhibits no distension and no mass. There is no tenderness. There is no guarding.  Musculoskeletal: Normal range of motion. She exhibits no edema or tenderness.  Lymphadenopathy:    She has no cervical adenopathy.  Neurological: She is alert and oriented to person, place, and time. No sensory deficit. She exhibits normal muscle tone.  Skin: Skin is  warm and dry. Capillary refill takes less than 2 seconds. No rash noted.  Psychiatric: She has a normal mood and affect. Her behavior is normal.  Vitals reviewed.    ASSESSMENT & PLAN: Kendra Nguyen Nguyen was seen today for annual exam.  Diagnoses and all orders for this visit:  Routine general medical examination at a health care facility -     CBC with Differential -     Comprehensive metabolic panel -     Hemoglobin A1c -     Lipid panel -     TSH  Morbid obesity (HCC)   Patient Instructions       IF you received an x-ray  today, you will receive an invoice from [facility]. Please contact [facility] at [phone] with questions or concerns regarding your invoice.   IF you received labwork today, you will receive an invoice from [facility]. Please contact [facility] at [phone] with questions or concerns regarding your invoice.   Our billing staff will not be able to assist you with questions regarding bills from these companies.  You will be contacted with the lab results as soon as they are available. The fastest way to get your results is to activate your My Chart account. Instructions are located on the last page of this paperwork. If you have not heard from [place] regarding the results in 2 weeks, please contact this office.     Health Maintenance, Female Adopting a healthy lifestyle and getting preventive care can go a long way to promote health and wellness. Talk with your health care provider about what schedule of regular examinations is right for you. This is a good chance for you to check in with your provider about disease prevention and staying healthy. In between checkups, there are plenty of things you can do on your own. Experts have done a lot of research about which lifestyle changes and preventive measures are most likely to keep you healthy. Ask your health care provider for more information. Weight and diet Eat a healthy diet  Be sure to include  plenty of vegetables, fruits, low-fat dairy products, and lean protein.  Do not eat a lot of foods high in solid fats, added sugars, or salt.  Get regular exercise. This is one of the most important things you can do for your health. ? Most adults should exercise for at least 150 minutes each week. The exercise should increase your heart rate and make you sweat (moderate-intensity exercise). ? Most adults should also do strengthening exercises at least twice a week. This is in addition to the moderate-intensity exercise.  Maintain a healthy weight  Body mass index (BMI) is a measurement that can be used to identify possible weight problems. It estimates body fat based on height and weight. Your health care provider can help determine your BMI and help you achieve or maintain a healthy weight.  For females 44 years of age and older: ? A BMI below 18.5 is considered underweight. ? A BMI of 18.5 to 24.9 is normal. ? A BMI of 25 to 29.9 is considered overweight. ? A BMI of 30 and above is considered obese.  Watch levels of cholesterol and blood lipids  You should start having your blood tested for lipids and cholesterol at 37 years of age, then have this test every 5 years.  You may need to have your cholesterol levels checked more often if: ? Your lipid or cholesterol levels are high. ? You are older than 37 years of age. ? You are at high risk for heart disease.  Cancer screening Lung Cancer  Lung cancer screening is recommended for adults 110-94 years old who are at high risk for lung cancer because of a history of smoking.  A yearly low-dose CT scan of the lungs is recommended for people who: ? Currently smoke. ? Have quit within the past 15 years. ? Have at least a 30-pack-year history of smoking. A pack year is smoking an average of one pack of cigarettes a day for 1 year.  Yearly screening should continue until it has been 15 years since you quit.  Yearly screening should  stop if you develop a  health problem that would prevent you from having lung cancer treatment.  Breast Cancer  Practice breast self-awareness. This means understanding how your breasts normally appear and feel.  It also means doing regular breast self-exams. Let your health care provider know about any changes, no matter how small.  If you are in your 20s or 30s, you should have a clinical breast exam (CBE) by a health care provider every 1-3 years as part of a regular health exam.  If you are 54 or older, have a CBE every year. Also consider having a breast X-ray (mammogram) every year.  If you have a family history of breast cancer, talk to your health care provider about genetic screening.  If you are at high risk for breast cancer, talk to your health care provider about having an MRI and a mammogram every year.  Breast cancer gene (BRCA) assessment is recommended for women who have family members with BRCA-related cancers. BRCA-related cancers include: ? Breast. ? Ovarian. ? Tubal. ? Peritoneal cancers.  Results of the assessment will determine the need for genetic counseling and BRCA1 and BRCA2 testing.  Cervical Cancer Your health care provider may recommend that you be screened regularly for cancer of the pelvic organs (ovaries, uterus, and vagina). This screening involves a pelvic examination, including checking for microscopic changes to the surface of your cervix (Pap test). You may be encouraged to have this screening done every 3 years, beginning at age 60.  For women ages 61-65, health care providers may recommend pelvic exams and Pap testing every 3 years, or they may recommend the Pap and pelvic exam, combined with testing for human papilloma virus (HPV), every 5 years. Some types of HPV increase your risk of cervical cancer. Testing for HPV may also be done on women of any age with unclear Pap test results.  Other health care providers may not recommend any screening for  nonpregnant women who are considered low risk for pelvic cancer and who do not have symptoms. Ask your health care provider if a screening pelvic exam is right for you.  If you have had past treatment for cervical cancer or a condition that could lead to cancer, you need Pap tests and screening for cancer for at least 20 years after your treatment. If Pap tests have been discontinued, your risk factors (such as having a new sexual partner) need to be reassessed to determine if screening should resume. Some women have medical problems that increase the chance of getting cervical cancer. In these cases, your health care provider may recommend more frequent screening and Pap tests.  Colorectal Cancer  This type of cancer can be detected and often prevented.  Routine colorectal cancer screening usually begins at 37 years of age and continues through 37 years of age.  Your health care provider may recommend screening at an earlier age if you have risk factors for colon cancer.  Your health care provider may also recommend using home test kits to check for hidden blood in the stool.  A small camera at the end of a tube can be used to examine your colon directly (sigmoidoscopy or colonoscopy). This is done to check for the earliest forms of colorectal cancer.  Routine screening usually begins at age 97.  Direct examination of the colon should be repeated every 5-10 years through 37 years of age. However, you may need to be screened more often if early forms of precancerous polyps or small growths are found.  Skin Cancer  Check your skin from head to toe regularly.  Tell your health care provider about any new moles or changes in moles, especially if there is a change in a mole's shape or color.  Also tell your health care provider if you have a mole that is larger than the size of a pencil eraser.  Always use sunscreen. Apply sunscreen liberally and repeatedly throughout the day.  Protect  yourself by wearing long sleeves, pants, a wide-brimmed hat, and sunglasses whenever you are outside.  Heart disease, diabetes, and high blood pressure  High blood pressure causes heart disease and increases the risk of stroke. High blood pressure is more likely to develop in: ? People who have blood pressure in the high end of the normal range (130-139/85-89 mm Hg). ? People who are overweight or obese. ? People who are African American.  If you are 79-30 years of age, have your blood pressure checked every 3-5 years. If you are 72 years of age or older, have your blood pressure checked every year. You should have your blood pressure measured twice-once when you are at a hospital or clinic, and once when you are not at a hospital or clinic. Record the average of the two measurements. To check your blood pressure when you are not at a hospital or clinic, you can use: ? An automated blood pressure machine at a pharmacy. ? A home blood pressure monitor.  If you are between 68 years and 38 years old, ask your health care provider if you should take aspirin to prevent strokes.  Have regular diabetes screenings. This involves taking a blood sample to check your fasting blood sugar level. ? If you are at a normal weight and have a low risk for diabetes, have this test once every three years after 37 years of age. ? If you are overweight and have a high risk for diabetes, consider being tested at a younger age or more often. Preventing infection Hepatitis B  If you have a higher risk for hepatitis B, you should be screened for this virus. You are considered at high risk for hepatitis B if: ? You were born in a country where hepatitis B is common. Ask your health care provider which countries are considered high risk. ? Your parents were born in a high-risk country, and you have not been immunized against hepatitis B (hepatitis B vaccine). ? You have HIV or AIDS. ? You use needles to inject street  drugs. ? You live with someone who has hepatitis B. ? You have had sex with someone who has hepatitis B. ? You get hemodialysis treatment. ? You take certain medicines for conditions, including cancer, organ transplantation, and autoimmune conditions.  Hepatitis C  Blood testing is recommended for: ? Everyone born from [birth year] through 1965. ? Anyone with known risk factors for hepatitis C.  Sexually transmitted infections (STIs)  You should be screened for sexually transmitted infections (STIs) including gonorrhea and chlamydia if: ? You are sexually active and are younger than 37 years of age. ? You are older than 37 years of age and your health care provider tells you that you are at risk for this type of infection. ? Your sexual activity has changed since you were last screened and you are at an increased risk for chlamydia or gonorrhea. Ask your health care provider if you are at risk.  If you do not have HIV, but are at risk, it may be recommended that you take a prescription  medicine daily to prevent HIV infection. This is called pre-exposure prophylaxis (PrEP). You are considered at risk if: ? You are sexually active and do not regularly use condoms or know the HIV status of your partner(s). ? You take drugs by injection. ? You are sexually active with a partner who has HIV.  Talk with your health care provider about whether you are at high risk of being infected with HIV. If you choose to begin PrEP, you should first be tested for HIV. You should then be tested every 3 months for as long as you are taking PrEP. Pregnancy  If you are premenopausal and you may become pregnant, ask your health care provider about preconception counseling.  If you may become pregnant, take 400 to 800 micrograms (mcg) of folic acid every day.  If you want to prevent pregnancy, talk to your health care provider about birth control (contraception). Osteoporosis and menopause  Osteoporosis is a disease  in which the bones lose minerals and strength with aging. This can result in serious bone fractures. Your risk for osteoporosis can be identified using a bone density scan.  If you are 1 years of age or older, or if you are at risk for osteoporosis and fractures, ask your health care provider if you should be screened.  Ask your health care provider whether you should take a calcium or vitamin D supplement to lower your risk for osteoporosis.  Menopause may have certain physical symptoms and risks.  Hormone replacement therapy may reduce some of these symptoms and risks. Talk to your health care provider about whether hormone replacement therapy is right for you. Follow these instructions at home:  Schedule regular health, dental, and eye exams.  Stay current with your immunizations.  Do not use any tobacco products including cigarettes, chewing tobacco, or electronic cigarettes.  If you are pregnant, do not drink alcohol.  If you are breastfeeding, limit how much and how often you drink alcohol.  Limit alcohol intake to no more than 1 drink per day for nonpregnant women. One drink equals 12 ounces of beer, 5 ounces of wine, or 1 ounces of hard liquor.  Do not use street drugs.  Do not share needles.  Ask your health care provider for help if you need support or information about quitting drugs.  Tell your health care provider if you often feel depressed.  Tell your health care provider if you have ever been abused or do not feel safe at home. This information is not intended to replace advice given to you by your health care provider. Make sure you discuss any questions you have with your health care provider. Document Released: 07/23/2010 Document Revised: 06/15/2015 Document Reviewed: 10/11/2014 Elsevier Interactive Patient Education  2018 [employer].      Agustina Caroli, MD [facility]

## 2017-08-27 LAB — CBC WITH DIFFERENTIAL/PLATELET
BASOS ABS: 0 10*3/uL (ref 0.0–0.2)
Basos: 0 %
EOS (ABSOLUTE): 0.1 10*3/uL (ref 0.0–0.4)
EOS: 1 %
HEMATOCRIT: 37.4 % (ref 34.0–46.6)
Hemoglobin: 12 g/dL (ref 11.1–15.9)
Immature Grans (Abs): 0 10*3/uL (ref 0.0–0.1)
Immature Granulocytes: 0 %
LYMPHS ABS: 4.2 10*3/uL — AB (ref 0.7–3.1)
Lymphs: 38 %
MCH: 27.8 pg (ref 26.6–33.0)
MCHC: 32.1 g/dL (ref 31.5–35.7)
MCV: 87 fL (ref 79–97)
MONOS ABS: 0.8 10*3/uL (ref 0.1–0.9)
Monocytes: 7 %
NEUTROS ABS: 6.1 10*3/uL (ref 1.4–7.0)
Neutrophils: 54 %
Platelets: 277 10*3/uL (ref 150–450)
RBC: 4.31 x10E6/uL (ref 3.77–5.28)
RDW: 13.2 % (ref 12.3–15.4)
WBC: 11.2 10*3/uL — AB (ref 3.4–10.8)

## 2017-08-27 LAB — COMPREHENSIVE METABOLIC PANEL
A/G RATIO: 1.3 (ref 1.2–2.2)
ALBUMIN: 3.4 g/dL — AB (ref 3.5–5.5)
ALK PHOS: 46 IU/L (ref 39–117)
ALT: 10 IU/L (ref 0–32)
AST: 10 IU/L (ref 0–40)
BILIRUBIN TOTAL: 0.4 mg/dL (ref 0.0–1.2)
BUN / CREAT RATIO: 10 (ref 9–23)
BUN: 9 mg/dL (ref 6–20)
CHLORIDE: 103 mmol/L (ref 96–106)
CO2: 24 mmol/L (ref 20–29)
Calcium: 8.7 mg/dL (ref 8.7–10.2)
Creatinine, Ser: 0.87 mg/dL (ref 0.57–1.00)
GFR calc Af Amer: 99 mL/min/{1.73_m2} (ref >59)
GFR calc non Af Amer: 86 mL/min/{1.73_m2} (ref >59)
GLOBULIN, TOTAL: 2.6 g/dL (ref 1.5–4.5)
Glucose: 88 mg/dL (ref 65–99)
POTASSIUM: 4.1 mmol/L (ref 3.5–5.2)
SODIUM: 138 mmol/L (ref 134–144)
Total Protein: 6 g/dL (ref 6.0–8.5)

## 2017-08-27 LAB — HEMOGLOBIN A1C
ESTIMATED AVERAGE GLUCOSE: 111 mg/dL
HEMOGLOBIN A1C: 5.5 % (ref 4.8–5.6)

## 2017-08-27 LAB — LIPID PANEL
CHOL/HDL RATIO: 2.7 ratio (ref 0.0–4.4)
Cholesterol, Total: 173 mg/dL (ref 100–199)
HDL: 64 mg/dL (ref >39)
LDL Calculated: 85 mg/dL (ref 0–99)
Triglycerides: 119 mg/dL (ref 0–149)
VLDL Cholesterol Cal: 24 mg/dL (ref 5–40)

## 2017-08-27 LAB — TSH: TSH: 2.42 u[IU]/mL (ref 0.450–4.500)

## 2017-09-16 ENCOUNTER — Telehealth: Payer: Self-pay | Admitting: Emergency Medicine

## 2017-09-16 ENCOUNTER — Ambulatory Visit (INDEPENDENT_AMBULATORY_CARE_PROVIDER_SITE_OTHER): Payer: Self-pay

## 2017-09-16 ENCOUNTER — Ambulatory Visit (INDEPENDENT_AMBULATORY_CARE_PROVIDER_SITE_OTHER): Payer: BC Managed Care – PPO | Admitting: Orthopaedic Surgery

## 2017-09-16 ENCOUNTER — Encounter (INDEPENDENT_AMBULATORY_CARE_PROVIDER_SITE_OTHER): Payer: Self-pay | Admitting: Orthopaedic Surgery

## 2017-09-16 VITALS — Ht 66.0 in | Wt 331.0 lb

## 2017-09-16 DIAGNOSIS — M25571 Pain in right ankle and joints of right foot: Secondary | ICD-10-CM | POA: Diagnosis not present

## 2017-09-16 MED ORDER — MELOXICAM 7.5 MG PO TABS: 7.5000 mg | ORAL_TABLET | Freq: Every day | ORAL | 2 refills | Status: DC | PRN

## 2017-09-16 MED ORDER — DICLOFENAC SODIUM 1 % TD GEL: 2.0000 g | Freq: Four times a day (QID) | TRANSDERMAL | 0 refills | Status: DC

## 2017-09-16 NOTE — Telephone Encounter (Signed)
Copied from [place]. Topic: General - Other >> Sep 15, 2017  4:04 PM Ivar Drape wrote: Reason for CRM:   Patient would like a call back to discuss her recent labs

## 2017-09-16 NOTE — Progress Notes (Signed)
Office Visit Note   Patient: Kendra Nguyen           Date of Birth: Jun 03, 1980           MRN: [id number] Visit Date: 09/16/2017              Requested by: Horald Pollen, MD [place], [place] [address] PCP: Horald Pollen, MD   Assessment & Plan: Visit Diagnoses:  1. Pain in right ankle and joints of right foot     Plan: Impression is right insertional Achilles tendinitis.  I have discussed with the patient going into a cam walker versus an ASO.  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  We will place her in an ASO brace today.  We will also start her in formal physical therapy.  Prescription was given today.  I also will call in Voltaren gel as well as an oral anti-inflammatory.  She will follow-up with [place] in 6 weeks time for recheck.  If she is doing very well she will call and let us know and cancel the appointment.  Follow-Up Instructions: Return in about 6 weeks (around 10/28/2017).   Orders:  Orders Placed This Encounter  Procedures  . XR Ankle Complete Right   Meds ordered this encounter  Medications  . diclofenac sodium (VOLTAREN) 1 % GEL    Sig: Apply 2 g topically 4 (four) times daily.    Dispense:  1 Tube    Refill:  0  . meloxicam (MOBIC) 7.5 MG tablet    Sig: Take 1 tablet (7.5 mg total) by mouth daily as needed for up to 30 doses for pain.    Dispense:  30 tablet    Refill:  2      Procedures: No procedures performed   Clinical Data: No additional findings.   Subjective: Chief Complaint  Patient presents with  . Right Ankle - Pain    Pain in the achilles area, painful to extend the achilles, and painful to walk on it.    HPI  Patient is a pleasant 37 year old female who presents to our clinic today with right heel pain.  This began approximately 1 week ago when she started walking for exercise.  The pain she has is to the distal Achilles right at the insertion.  She notes  moderate stiffness worse going from a seated to standing position as well as first thing in the morning when she gets out of the bed.  There is nothing that seems to make this better.  No numbness, tingling or burning.   Review of Systems as detailed in HPI.  All others reviewed and are negative.   Objective: Vital Signs: Ht 5\' 6"  (1.676 m)   Wt (!) 331 lb (150.1 kg)   BMI 53.42 kg/m   Physical Exam well-developed and well-nourished female in no acute distress.  Alert and oriented x3.  Ortho Exam examination of the right ankle reveals moderate retrocalcaneal tenderness.  No tenderness to the pre-Achilles bursa.  Calf is soft and nontender.  She can dorsiflex to neutral position.  This is with mild pain.  Specialty Comments:  No specialty comments available.  Imaging: Xr Ankle Complete Right  Result Date: 09/16/2017 Very small calcification distal Achilles attachment    PMFS History: Patient Active Problem List   Diagnosis Date Noted  . Pain in right ankle and joints of right foot 09/16/2017  . 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 Pre-eclampsia affecting pregnancy, antepartum 01/17/2016  . Preeclampsia, third trimester 01/17/2016  . S/P gastric bypass 12/18/2014  . 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 Pure hypercholesterolemia 12/15/2013  . Vitamin D deficiency 12/15/2013  . Morbid obesity ([place]) 03/21/2011   Past Medical History:  Diagnosis Date  . Hx of laparoscopic gastric banding   . Hyperlipemia   . Hypertension    was taken of HTN meds by PCP "years ago"  . Morbid obesity with BMI of 45.0-49.9, adult ([place])   . OCD (obsessive compulsive disorder) 04/2016  . Varicose veins     Family History  Problem Relation Age of Onset  . Diabetes Mother   . Heart disease Mother   . Arthritis Father        gout  . COPD Father        smoker  . Hyperlipidemia Father   . Cancer Father        stomach  . Aneurysm Sister 34       cerebral;   .  COPD Paternal 38   . Heart disease Maternal Grandmother   . Heart disease Maternal Grandfather   . Neuropathy Neg Hx     Past Surgical History:  Procedure Laterality Date  . CESAREAN SECTION N/A 01/18/2016   Procedure: CESAREAN SECTION;  Surgeon: Everett Graff, MD;  Location: [facility];  Service: Obstetrics;  Laterality: N/A;  . COLONOSCOPY WITH PROPOFOL N/A 11/24/2014   Procedure: COLONOSCOPY WITH PROPOFOL;  Surgeon: Milus Banister, MD;  Location: [facility];  Service: Endoscopy;  Laterality: N/A;  . LAPAROSCOPIC GASTRIC BANDING  04/17/2011  . [employer] RESECTION     2016  . WISDOM TOOTH EXTRACTION     Social History   Occupational History  . Occupation: Pharmacist, hospital- middle school  Tobacco Use  . Smoking status: Never Smoker  . Smokeless tobacco: Never Used  Substance and Sexual Activity  . Alcohol use: No  . Drug use: No  . Sexual activity: Not on file

## 2017-09-16 NOTE — Telephone Encounter (Signed)
Tried to call pt Mail Box full will send lab letter and try again later.

## 2017-09-23 ENCOUNTER — Telehealth: Payer: Self-pay | Admitting: Emergency Medicine

## 2017-09-23 NOTE — Telephone Encounter (Signed)
Pt given lab results per notes of Dr. Mitchel Honour on 08/27/17. Pt verbalized understanding.Unable to document in result note.

## 2017-10-16 ENCOUNTER — Ambulatory Visit: Payer: BC Managed Care – PPO | Admitting: Allergy

## 2017-12-04 ENCOUNTER — Ambulatory Visit: Payer: Self-pay

## 2017-12-04 ENCOUNTER — Other Ambulatory Visit: Payer: Self-pay

## 2017-12-04 ENCOUNTER — Ambulatory Visit: Payer: BC Managed Care – PPO | Admitting: Physician Assistant

## 2017-12-04 ENCOUNTER — Encounter: Payer: Self-pay | Admitting: Physician Assistant

## 2017-12-04 VITALS — BP 137/88 | HR 84 | Temp 98.6°F | Resp 16 | Ht 66.0 in | Wt 333.6 lb

## 2017-12-04 DIAGNOSIS — R202 Paresthesia of skin: Secondary | ICD-10-CM | POA: Diagnosis not present

## 2017-12-04 DIAGNOSIS — F418 Other specified anxiety disorders: Secondary | ICD-10-CM | POA: Insufficient documentation

## 2017-12-04 DIAGNOSIS — R4589 Other symptoms and signs involving emotional state: Secondary | ICD-10-CM | POA: Insufficient documentation

## 2017-12-04 NOTE — Telephone Encounter (Signed)
Pt c/o tingling or pins and needles (pt denies numbness) to left hand, left lateral to medial thigh area and left foot. Pt symptoms began yesterday. Pt stated that she has full strength in the areas involved. Pt stated that the sensation feels hard to describe. Pt stated that she has had these symptoms years ago (2016) and was worked up. DVT was ruled out, pt stated that she had an MRI and it was normal. Pt stated the cause was never found and the symptoms went away on their own. Pt was referred to a neurologist back in 2016 for the same symptoms. Pt stated yesterday the had a "throbbing" sensation to the left side of her head. Pt stated it was not a headache and went away after 7 minutes. Pt stated that she did not have to take Ibuprofen or Acetaminophen for it. Pt stated that her activity and morning routine has not been impacted. Pt denies vision changes, no arm drift and stated her smile is even. Pt denies dizziness, changes in speech or unsteadiness on her feet.  Pt needing evaluation within four hours. Appt made with Whitney McVey PA at 1020. Care advice given and pt verbalized understanding. No appointments available with PCP or Dr Carlota Raspberry.  Reason for Disposition . [1] Tingling (e.g., pins and needles) of the face, arm / hand, or leg / foot on one side of the body AND [2] present now  Answer Assessment - Initial Assessment Questions 1. SYMPTOM: "What is the main symptom you are concerned about?" (e.g., weakness, numbness)     Numbness  2. ONSET: "When did this start?" (minutes, hours, days; while sleeping)     yesterday 3. LAST NORMAL: "When was the last time you were normal (no symptoms)?"     Tuesday night 4. PATTERN "Does this come and go, or has it been constant since it started?"  "Is it present now?"     Constant started gradually went from hand to foot then leg 5. CARDIAC SYMPTOMS: "Have you had any of the following symptoms: chest pain, difficulty breathing, palpitations?"    Pinching  pain left breast area 6. NEUROLOGIC SYMPTOMS: "Have you had any of the following symptoms: headache, dizziness, vision loss, double vision, changes in speech, unsteady on your feet?"    Pt stated yesterday that for 7 seconds she had a throbbing sensation to the left side of her head and then it went away. 7. OTHER SYMPTOMS: "Do you have any other symptoms?"     no 8. PREGNANCY: "Is there any chance you are pregnant?" "When was your last menstrual period?"     No- LMP: Mu[place]  Protocols used: NEUROLOGIC DEFICIT-A-AH

## 2017-12-04 NOTE — Progress Notes (Signed)
Kendra Nguyen  MRN: [id number] DOB: August 28, 1980  PCP: Horald Pollen, MD  Subjective:  Pt is a 37 year old female who presents to clinic for n/t of her left side.   Feels "weird" in her left leg and foot starting yesterday. She endorses tingling of her right hand within the last hour. This is mostly resolved. She was thinking that this could be a heart attack, then symptoms of her hand showed up.  Also endorses tingling sensation of her foot "in the same spot on my foot like it is on my hand" Denies muscle weakness, abnormal coordination.   She has been to neurology a few years ago for these same symptoms. (per pt) She has been worked up by cards for similar problems but with chest pain (per pt) Negative MRI. (per pt)  Endorses tightness of left shoulder. Occasional pain of back of left upper arm and underneath left shoulder blade. "a massage person told me that was the most tight spot"  "My anxiety has been through the roof recently". Endorses stressful job. She has always suffered with anxiety - never been treated.  She is seeing therapist weekly for the past 1-2 months. There is a provider there that can prescribe medications. She has an appt tomorrow and plans to discuss possible treatment.  She is currently planning her wedding.   She was seen for annual exam on 8/6 with Dr. Mitchel Honour. Recent labs: Lab Results  Component Value Date   HGBA1C 5.5 08/26/2017   Lab Results  Component Value Date   TSH 2.420 08/26/2017   Lab Results  Component Value Date   CHOL 173 08/26/2017   HDL 64 08/26/2017   LDLCALC 85 08/26/2017   TRIG 119 08/26/2017   CHOLHDL 2.7 08/26/2017   Lab Results  Component Value Date   WBC 11.2 (H) 08/26/2017   HGB 12.0 08/26/2017   HCT 37.4 08/26/2017   MCV 87 08/26/2017   PLT 277 08/26/2017     Pt  has a past medical history of laparoscopic gastric banding, Hyperlipemia, Hypertension, Morbid obesity with BMI of 45.0-49.9, adult ([place]), OCD  (obsessive compulsive disorder) (04/2016), and Varicose veins.   Review of Systems  Musculoskeletal: Negative for back pain, neck pain and neck stiffness.  Skin: Negative.   Neurological: Positive for numbness. Negative for weakness.  Psychiatric/Behavioral: Negative for dysphoric mood, self-injury, sleep disturbance and suicidal ideas. The patient is nervous/anxious.     Patient Active Problem List   Diagnosis Date Noted  . Pain in right ankle and joints of right foot 09/16/2017  . Tenosynovitis, de [place] 04/09/2016  . Pre-eclampsia affecting pregnancy, antepartum 01/17/2016  . Preeclampsia, third trimester 01/17/2016  . S/P gastric bypass 12/18/2014  . 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 Pure hypercholesterolemia 12/15/2013  . Vitamin D deficiency 12/15/2013  . Morbid obesity ([place]) 03/21/2011    Current Outpatient Medications on File Prior to Visit  Medication Sig Dispense Refill  . levonorgestrel (MIRENA, 52 MG,) 20 MCG/24HR IUD 1 each by Intrauterine route once.    . diclofenac sodium (VOLTAREN) 1 % GEL Apply 2 g topically 4 (four) times daily. (Patient not taking: Reported on 12/04/2017) 1 Tube 0  . fluticasone (FLONASE) 50 MCG/ACT nasal spray Place 2 sprays into both nostrils daily. (Patient not taking: Reported on 12/04/2017) 16 g 6  . meloxicam (MOBIC) 7.5 MG tablet Take 1 tablet (7.5 mg total) by mouth daily as needed for up to 30 doses for pain. (Patient not  taking: Reported on 12/04/2017) 30 tablet 2   No current facility-administered medications on file prior to visit.     Allergies  Allergen Reactions  . Tape     rash  . Neomycin Rash     Objective:  BP 137/88 (BP Location: Right Arm, Patient Position: Sitting, Cuff Size: Large)   Pulse 84   Temp 98.6 F (37 C) (Oral)   Resp 16   Ht 5\' 6"  (1.676 m)   Wt (!) 333 lb 9.6 oz (151.3 kg)   SpO2 98%   BMI 53.84 kg/m   Physical Exam  Constitutional: She is oriented to person,  place, and time. She appears well-developed and well-nourished. No distress.  Morbidly obese  Neurological: She is alert and oriented to person, place, and time. She has normal strength and normal reflexes. No sensory deficit.  Psychiatric: She has a normal mood and affect. Her behavior is normal. Judgment and thought content normal. Cognition and memory are normal.  Vitals reviewed.   Assessment and Plan :  1. Tingling 2. Anxiety about health - Pt presents c/o intermittent tingling sensation of her left hand and left foot. No red flags. HPI suggests symptoms are related to anxiety, which she agrees with. Recent labs for annual exam are wnl.  She is currently seeing therapist and has an appt tomorrow - will discuss medication.  She will RTC if sympotms worsen or she develops muscle weakness.    Mercer Pod, PA-C  [facility] 12/04/2017 10:38 AM  Please note: Portions of this report may have been transcribed using dragon voice recognition software. Every effort was made to ensure accuracy; however, inadvertent computerized transcription errors may be present.

## 2017-12-04 NOTE — Patient Instructions (Addendum)
Come back if you are not improving after starting a medication with your therapist.   These are some of my general health and wellness recommendations. Some of them may apply to you better than others. Please use common sense as you try these suggestions and feel free to ask me any questions.  RELAXATION Consider practicing mindfulness meditation or other relaxation techniques such as deep breathing, prayer, yoga, tai chi, massage. See website mindful.org or the apps Headspace or Calm to help get started.   ACTIVITY/FITNESS Mental, social, emotional and physical stimulation are very important for brain and body health. Try learning a new activity (arts, music, language, sports, games).  Keep moving your body to the best of your abilities. You can do this at home, inside or outside, the park, community center, gym or anywhere you like. Consider a physical therapist or personal trainer to get started. Consider the app Sworkit. Fitness trackers such as smart-watches, smart-phones or Fitbits can help as well.   NUTRITION Eat more plants: colorful vegetables, nuts, seeds and berries. Eat less sugar, salt, preservatives and processed foods. Avoid toxins such as cigarettes and alcohol. Drink water when you are thirsty. Warm water with a slice of lemon is an excellent morning drink to start the day.  Consider these websites for more information The Nutrition Source (https://www.henry-hernandez.biz/) Precision Nutrition (WindowBlog.ch)   SLEEP Try to get at least 7-8+ hours sleep per day. Regular exercise and reduced caffeine will help you sleep better. Practice good sleep hygeine techniques. See website sleep.org for more information.  Thank you for coming in today. I hope you feel we met your needs.  Feel free to call PCP if you have any questions or further requests.  Please consider signing up for MyChart if you do not already have it,  as this is a great way to communicate with me.  Best,  [employer], PA-C

## 2017-12-11 ENCOUNTER — Encounter: Payer: Self-pay | Admitting: Emergency Medicine

## 2017-12-11 ENCOUNTER — Other Ambulatory Visit: Payer: Self-pay

## 2017-12-11 ENCOUNTER — Emergency Department: Payer: BC Managed Care – PPO

## 2017-12-11 ENCOUNTER — Emergency Department
Admission: EM | Admit: 2017-12-11 | Discharge: 2017-12-11 | Disposition: A | Discharge status: Home / Self Care | Payer: BC Managed Care – PPO | Attending: Emergency Medicine | Admitting: Emergency Medicine

## 2017-12-11 DIAGNOSIS — M79605 Pain in left leg: Secondary | ICD-10-CM | POA: Diagnosis not present

## 2017-12-11 DIAGNOSIS — I10 Essential (primary) hypertension: Secondary | ICD-10-CM | POA: Diagnosis not present

## 2017-12-11 NOTE — ED Notes (Signed)
See paper dispo , computer downtime

## 2017-12-11 NOTE — Discharge Instructions (Signed)
Your workup in the Emergency Department today was reassuring.  We did not find any specific abnormalities including no blood clot on your ultrasound.  Return to the Emergency Department if you develop new or worsening symptoms that concern you.

## 2017-12-11 NOTE — ED Provider Notes (Signed)
[facility] Emergency Department Provider Note  ____________________________________________   First MD Initiated Contact with Patient 12/11/17 0258     (approximate)  I have reviewed the triage vital signs and the nursing notes.   HISTORY  Chief Complaint Leg Pain    HPI Kendra Nguyen is a 37 y.o. female with medical history as listed below presents for evaluation of intermittent pain and swelling to her left leg, primarily the left foot and left calf.  She says that she has had this kind of pain and swelling before but she is worried because her mother died from a blood clot.  She has seen her primary care doctor for this before and was told that she was fine at the time but if she had any worsening symptoms she should go to the emergency department to have an ultrasound.  She says she has had ultrasounds in the past that have all been negative.  She has a Mirena IUD but takes no other exogenous hormones or birth control.  No recent surgeries, no long trips, no immobilizations.  No personal history of blood clots in the legs of the lungs.  She says the swelling is better now but it was worse earlier tonight.  She describes it as moderate and nothing particular makes it better or worse.  Past Medical History:  Diagnosis Date  . Hx of laparoscopic gastric banding   . Hyperlipemia   . Hypertension    was taken of HTN meds by PCP "years ago"  . Morbid obesity with BMI of 45.0-49.9, adult ([place])   . OCD (obsessive compulsive disorder) 04/2016  . Varicose veins     Patient Active Problem List   Diagnosis Date Noted  . Anxiety about health 12/04/2017  . Pain in right ankle and joints of right foot 09/16/2017  . Tenosynovitis, de Quervain 04/09/2016  . Pre-eclampsia affecting pregnancy, antepartum 01/17/2016  . Preeclampsia, third trimester 01/17/2016  . S/P gastric bypass 12/18/2014  . 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 Pure  hypercholesterolemia 12/15/2013  . Vitamin D deficiency 12/15/2013  . Morbid obesity ([place]) 03/21/2011    Past Surgical History:  Procedure Laterality Date  . CESAREAN SECTION N/A 01/18/2016   Procedure: CESAREAN SECTION;  Surgeon: Everett Graff, MD;  Location: [place];  Service: Obstetrics;  Laterality: N/A;  . COLONOSCOPY WITH PROPOFOL N/A 11/24/2014   Procedure: COLONOSCOPY WITH PROPOFOL;  Surgeon: Milus Banister, MD;  Location: [facility];  Service: Endoscopy;  Laterality: N/A;  . LAPAROSCOPIC GASTRIC BANDING  04/17/2011  . [place] RESECTION     2016  . WISDOM TOOTH EXTRACTION      Prior to Admission medications   Medication Sig Start Date End Date Taking? Authorizing Provider  diclofenac sodium (VOLTAREN) 1 % GEL Apply 2 g topically 4 (four) times daily. Patient not taking: Reported on 12/04/2017 09/16/17   Aundra Dubin, PA-C  fluticasone [facility]) 50 MCG/ACT nasal spray Place 2 sprays into both nostrils daily. Patient not taking: Reported on 12/04/2017 08/21/17   McVey, Gelene Mink, PA-C  levonorgestrel (MIRENA, 52 MG,) 20 MCG/24HR IUD 1 each by Intrauterine route once.    [provider]  meloxicam (MOBIC) 7.5 MG tablet Take 1 tablet (7.5 mg total) by mouth daily as needed for up to 30 doses for pain. Patient not taking: Reported on 12/04/2017 09/16/17   Aundra Dubin, PA-C    Allergies Tape and Neomycin  Family History  Problem Relation  Age of Onset  . Diabetes Mother   . Heart disease Mother   . Arthritis Father        gout  . COPD Father        smoker  . Hyperlipidemia Father   . Cancer Father        stomach  . Aneurysm Sister 34       cerebral;   . COPD Paternal 53   . Heart disease Maternal Grandmother   . Heart disease Maternal Grandfather   . Neuropathy Neg Hx     Social History Social History   Tobacco Use  . Smoking status: Never Smoker  . Smokeless tobacco: Never Used  Substance Use Topics  .  Alcohol use: No  . Drug use: No    Review of Systems Constitutional: No fever/chills Eyes: No visual changes. ENT: No sore throat. Cardiovascular: Denies chest pain. Respiratory: Denies shortness of breath. Gastrointestinal: No abdominal pain.  No nausea, no vomiting.  No diarrhea.  No constipation. Genitourinary: Negative for dysuria. Musculoskeletal: Intermittent pain and swelling in the left leg. Integumentary: Negative for rash. Neurological: Negative for headaches, focal weakness or numbness.   ____________________________________________   PHYSICAL EXAM:  VITAL SIGNS: ED Triage Vitals  Enc Vitals Group     BP 12/11/17 0255 134/85     Pulse Rate 12/11/17 0255 69     Resp 12/11/17 0255 17     Temp 12/11/17 0255 98.3 F (36.8 C)     Temp Source 12/11/17 0255 Oral     SpO2 12/11/17 0255 100 %     Weight 12/11/17 0253 (!) 151 kg (333 lb)     Height 12/11/17 0253 1.676 m (5\' 6" )     Head Circumference --      Peak Flow --      Pain Score 12/11/17 0253 3     Pain Loc --      Pain Edu? --      Excl. in [place]? --     Constitutional: Alert and oriented. Well appearing and in no acute distress. Eyes: Conjunctivae are normal.  Head: Atraumatic. Neck: No stridor.  No meningeal signs.   Cardiovascular: Normal rate, regular rhythm. Good peripheral circulation. Grossly normal heart sounds. Respiratory: Normal respiratory effort.  No retractions. Lungs CTAB. Gastrointestinal: Obese.  Soft and nontender. No distention.  Musculoskeletal: No lower extremity tenderness nor edema. No gross deformities of extremities.  No visible difference between the left and right legs and no tenderness to palpation.  Normal range of motion.  No palpable popliteal abnormalities along the venous distribution. Neurologic:  Normal speech and language. No gross focal neurologic deficits are appreciated.  Skin:  Skin is warm, dry and intact. No rash noted. Psychiatric: Mood and affect are normal. Speech  and behavior are normal.  ____________________________________________   LABS (all labs ordered are listed, but only abnormal results are displayed)  Labs Reviewed - No data to display ____________________________________________  EKG  None - EKG not ordered by ED physician ____________________________________________  RADIOLOGY   ED MD interpretation:  No DVT  Official radiology report(s): US Venous Img Lower Unilateral Left  Result Date: 12/11/2017 CLINICAL DATA:  Intermittent left leg pain and swelling for 1 week. EXAM: Left LOWER EXTREMITY VENOUS DOPPLER ULTRASOUND TECHNIQUE: Gray-scale sonography with graded compression, as well as color Doppler and duplex ultrasound were performed to evaluate the lower extremity deep venous systems from the level of the common femoral vein and including the common femoral, femoral, profunda femoral,  popliteal and calf veins including the posterior tibial, peroneal and gastrocnemius veins when visible. The superficial great saphenous vein was also interrogated. Spectral Doppler was utilized to evaluate flow at rest and with distal augmentation maneuvers in the common femoral, femoral and popliteal veins. COMPARISON:  None. FINDINGS: Contralateral Common Femoral Vein: Respiratory phasicity is normal and symmetric with the symptomatic side. No evidence of thrombus. Normal compressibility. Common Femoral Vein: No evidence of thrombus. Normal compressibility, respiratory phasicity and response to augmentation. Saphenofemoral Junction: No evidence of thrombus. Normal compressibility and flow on color Doppler imaging. Profunda Femoral Vein: No evidence of thrombus. Normal compressibility and flow on color Doppler imaging. Femoral Vein: No evidence of thrombus. Normal compressibility, respiratory phasicity and response to augmentation. Popliteal Vein: No evidence of thrombus. Normal compressibility, respiratory phasicity and response to augmentation. Calf  Veins: No evidence of thrombus. Normal compressibility and flow on color Doppler imaging. Superficial Great Saphenous Vein: No evidence of thrombus. Normal compressibility. Venous Reflux:  None. Other Findings:  None. IMPRESSION: No evidence of deep venous thrombosis. Electronically Signed   By: Lucienne Capers M.D.   On: 12/11/2017 04:36    ____________________________________________   PROCEDURES  Critical Care performed: No   Procedure(s) performed:   Procedures   ____________________________________________   INITIAL IMPRESSION / ASSESSMENT AND PLAN / ED COURSE  As part of my medical decision making, I reviewed the following data within the [place] notes reviewed and incorporated, Old chart reviewed and Notes from prior ED visits    Differential diagnosis includes, but is not limited to, musculoskeletal pain, DVT, cellulitis.  The physical exam is reassuring.  Will obtain ultrasound.  Vital signs are stable.  Wells score for DVT is 0 but we will proceed with the ultrasound and then anticipate discharge with outpatient follow-up.      (Note that documentation was delayed due to multiple ED patients requiring immediate care.)  No indication of DVT on ultrasound.  Recommended close outpatient follow-up and over-the-counter pain medication as needed.  Patient understands and agrees with the plan.  ____________________________________________  FINAL CLINICAL IMPRESSION(S) / ED DIAGNOSES  Final diagnoses:  Left leg pain     MEDICATIONS GIVEN DURING THIS VISIT:  Medications - No data to display   ED Discharge Orders    None       Note:  This document was prepared using Dragon voice recognition software and may include unintentional dictation errors.    Hinda Kehr, MD 12/11/17 [id number]

## 2017-12-11 NOTE — ED Notes (Signed)
Pt in with co left left pain states posterior thigh that radiates to left calf and left foot. States started 1 week ago, saw pmd but no tests were ordered. Pt here for worsening left foot pain, pulses pos and circ wnl.

## 2017-12-11 NOTE — ED Triage Notes (Signed)
Patient ambulatory to triage with steady gait, without difficulty or distress noted; pt reports left leg pain & swelling since last wk; st she was sent to eval for possible blood clot

## 2018-02-01 ENCOUNTER — Encounter (HOSPITAL_COMMUNITY): Payer: Self-pay | Admitting: Emergency Medicine

## 2018-02-01 ENCOUNTER — Ambulatory Visit (HOSPITAL_COMMUNITY)
Admission: EM | Admit: 2018-02-01 | Discharge: 2018-02-01 | Disposition: A | Discharge status: Home / Self Care | Payer: BC Managed Care – PPO

## 2018-02-01 DIAGNOSIS — R030 Elevated blood-pressure reading, without diagnosis of hypertension: Secondary | ICD-10-CM | POA: Insufficient documentation

## 2018-02-01 DIAGNOSIS — T148XXA Other injury of unspecified body region, initial encounter: Secondary | ICD-10-CM | POA: Diagnosis not present

## 2018-02-01 NOTE — ED Triage Notes (Signed)
Pt c/o lower abdominal pain, non tender, since Monday, states she started a boot camp and it started hurting after.

## 2018-02-01 NOTE — ED Provider Notes (Signed)
02/01/2018 10:52 AM   DOB: 1980/11/15 / MRN: [id number]  SUBJECTIVE:  Kendra Nguyen is a 38 y.o. female presenting for abdominal pain that started after new physical activity.  She reports no tenderness.  Denies dysuria, urgency, frequency.  Denies fever, chills, nausea.  Denies GERD.  Patient has a history of elevated blood pressures and does have a monitor at home.  She reports feeling nervous when she comes into the doctor's office.  She has not been checking it for some time but agrees to start checking and if pressures are greater than 140/90 on a consistent basis she will seek the care of her primary care provider.  She is allergic to tape and neomycin.   She  has a past medical history of laparoscopic gastric banding, Hyperlipemia, Hypertension, Morbid obesity with BMI of 45.0-49.9, adult ([place]), OCD (obsessive compulsive disorder) (04/2016), and Varicose veins.    She  reports that she has never smoked. She has never used smokeless tobacco. She reports that she does not drink alcohol or use drugs. She  has no history on file for sexual activity. The patient  has a past surgical history that includes Laparoscopic gastric banding (04/17/2011); Laparoscopic gastric sleeve resection; Colonoscopy with propofol (N/A, 11/24/2014); Wisdom tooth extraction; and Cesarean section (N/A, 01/18/2016).  Her family history includes Aneurysm (age of onset: 85) in her sister; Arthritis in her father; COPD in her father and paternal aunt; Cancer in her father; Diabetes in her mother; Heart disease in her maternal grandfather, maternal grandmother, and mother; Hyperlipidemia in her father.  ROS per HPI  OBJECTIVE:  BP (!) 164/93   Pulse 69   Temp 98.1 F (36.7 C)   Resp 18   SpO2 98%   Wt Readings from Last 3 Encounters:  12/11/17 (!) 333 lb (151 kg)  12/04/17 (!) 333 lb 9.6 oz (151.3 kg)  09/16/17 (!) 331 lb (150.1 kg)   Temp Readings from Last 3 Encounters:  02/01/18 98.1 F (36.7 C)  12/11/17  98.3 F (36.8 C) (Oral)  12/04/17 98.6 F (37 C) (Oral)   BP Readings from Last 3 Encounters:  02/01/18 (!) 164/93  12/11/17 134/85  12/04/17 137/88   Pulse Readings from Last 3 Encounters:  02/01/18 69  12/11/17 69  12/04/17 84    Physical Exam Vitals signs reviewed.  Constitutional:      General: She is not in acute distress.    Appearance: Normal appearance. She is not diaphoretic.  Eyes:     Pupils: Pupils are equal, round, and reactive to light.  Cardiovascular:     Rate and Rhythm: Normal rate.  Pulmonary:     Effort: Pulmonary effort is normal.  Abdominal:     General: Bowel sounds are normal. There is no distension.     Palpations: Abdomen is soft. Abdomen is not rigid. There is no mass.     Tenderness: There is no abdominal tenderness. There is no guarding or rebound.  Skin:    General: Skin is dry.  Neurological:     Mental Status: She is alert and oriented to person, place, and time.     Cranial Nerves: No cranial nerve deficit.     Gait: Gait normal.     No results found for this or any previous visit (from the past 72 hour(s)).  No results found.  ASSESSMENT AND PLAN:   Muscle strain - She has a nontender exam.  The pain is worse whenever she contracts the rectus muscle.  She  has no pain when she is walking.  Nonbloody bowel movements and normal bowel movements.  She denies dysuria.  Discharge Instructions   None        The patient is advised to call or return to clinic if she does not see an improvement in symptoms, or to seek the care of the closest emergency department if she worsens with the above plan.   Philis Fendt, MHS, PA-C 02/01/2018 10:52 AM   Tereasa Coop, PA-C 02/01/18 1053

## 2018-03-24 ENCOUNTER — Encounter (INDEPENDENT_AMBULATORY_CARE_PROVIDER_SITE_OTHER): Payer: Self-pay | Admitting: Orthopaedic Surgery

## 2018-03-24 ENCOUNTER — Ambulatory Visit (INDEPENDENT_AMBULATORY_CARE_PROVIDER_SITE_OTHER): Payer: BC Managed Care – PPO | Admitting: Orthopaedic Surgery

## 2018-03-24 DIAGNOSIS — M7661 Achilles tendinitis, right leg: Secondary | ICD-10-CM | POA: Diagnosis not present

## 2018-03-24 MED ORDER — NITROGLYCERIN 0.2 MG/HR TD PT24: MEDICATED_PATCH | TRANSDERMAL | 12 refills | Status: DC

## 2018-03-24 NOTE — Progress Notes (Signed)
Office Visit Note   Patient: Kendra Nguyen           Date of Birth: 1980/10/24           MRN: [id number] Visit Date: 03/24/2018              Requested by: Horald Pollen, MD [place], [place] [id number] PCP: Horald Pollen, MD   Assessment & Plan: Visit Diagnoses:  1. Achilles tendinitis of right lower extremity   2. Morbid obesity ([place])     Plan: Impression is right Achilles tendinopathy.  We will place the patient in a cam walker weightbearing as tolerated.  She notes that she will be able to restart physical therapy at this point.  I will also call in nitro patches to apply as needed.  She will follow-up with [place] in 6 weeks time for recheck.  Should she have no improvement of symptoms, we may go ahead and obtain an MRI to assess her Achilles tendon.  The patient meets the AMA guidelines for Morbid (severe) obesity with a BMI > 40.0 and I have recommended weight loss.   Follow-Up Instructions: Return in about 6 weeks (around 05/05/2018).   Orders:  No orders of the defined types were placed in this encounter.  Meds ordered this encounter  Medications  . nitroGLYCERIN (NITRODUR - DOSED IN MG/24 HR) 0.2 mg/hr patch    Sig: Alternate patch every 24 hours    Dispense:  30 patch    Refill:  12      Procedures: No procedures performed   Clinical Data: No additional findings.   Subjective: Chief Complaint  Patient presents with  . Right Ankle - Pain, Follow-up    HPI patient is a pleasant 38 year old female who presents to our clinic today with recurrent right heel pain.  History of Achilles tendinitis with attachment spur.  She was seen in our office this past August.  She is a special needs teacher and opted to try an ASO brace, physical therapy and topical anti-inflammatory rather than a cam walker.  She noted slight improvement of pain while attending physical therapy, but due to financial constraints she was unable to continue.  Her pain has  returned and is started to worsen.  Worse with weightbearing.  All of her pain is to the distal third of the Achilles.  Review of Systems as detailed in HPI all others reviewed and are negative.   Objective: Vital Signs: There were no vitals taken for this visit.  Physical Exam well-developed well-nourished female no acute distress.  Alert and oriented x3.  Ortho Exam examination of her right heel reveals moderate tenderness to the distal Achilles.  No tenderness to the pre-Achilles bursa.  Negative Thompson's test.  Limited range of motion with dorsiflexion.  She is neurovascularly intact distally.  Specialty Comments:  No specialty comments available.  Imaging: No new imaging   PMFS History: Patient Active Problem List   Diagnosis Date Noted  . Achilles tendinitis of right lower extremity 03/24/2018  . Anxiety about health 12/04/2017  . Pain in right ankle and joints of right foot 09/16/2017  . Tenosynovitis, de Quervain 04/09/2016  . Pre-eclampsia affecting pregnancy, antepartum 01/17/2016  . Preeclampsia, third trimester 01/17/2016  . S/P gastric bypass 12/18/2014  . 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 Pure hypercholesterolemia 12/15/2013  . Vitamin D deficiency 12/15/2013  . Morbid obesity ([place]) 03/21/2011   Past Medical History:  Diagnosis Date  . Hx  of laparoscopic gastric banding   . Hyperlipemia   . Hypertension    was taken of HTN meds by PCP "years ago"  . Morbid obesity with BMI of 45.0-49.9, adult ([place])   . OCD (obsessive compulsive disorder) 04/2016  . Varicose veins     Family History  Problem Relation Age of Onset  . Diabetes Mother   . Heart disease Mother   . Arthritis Father        gout  . COPD Father        smoker  . Hyperlipidemia Father   . Cancer Father        stomach  . Aneurysm Sister 34       cerebral;   . COPD Paternal 8   . Heart disease Maternal Grandmother   . Heart disease Maternal Grandfather   .  Neuropathy Neg Hx     Past Surgical History:  Procedure Laterality Date  . CESAREAN SECTION N/A 01/18/2016   Procedure: CESAREAN SECTION;  Surgeon: Everett Graff, MD;  Location: [place];  Service: Obstetrics;  Laterality: N/A;  . COLONOSCOPY WITH PROPOFOL N/A 11/24/2014   Procedure: COLONOSCOPY WITH PROPOFOL;  Surgeon: Milus Banister, MD;  [facility];  Service: Endoscopy;  Laterality: N/A;  . LAPAROSCOPIC GASTRIC BANDING  04/17/2011  . [place] RESECTION     2016  . WISDOM TOOTH EXTRACTION     Social History   Occupational History  . Occupation: Pharmacist, hospital- middle school  Tobacco Use  . Smoking status: Never Smoker  . Smokeless tobacco: Never Used  Substance and Sexual Activity  . Alcohol use: No  . Drug use: No  . Sexual activity: Not on file

## 2018-05-04 ENCOUNTER — Telehealth (INDEPENDENT_AMBULATORY_CARE_PROVIDER_SITE_OTHER): Payer: Self-pay

## 2018-05-04 NOTE — Telephone Encounter (Signed)
Called and left message with patient that I was calling confirming appointment for tomorrow and to ask her some general screening questions

## 2018-05-05 ENCOUNTER — Ambulatory Visit (INDEPENDENT_AMBULATORY_CARE_PROVIDER_SITE_OTHER): Payer: BC Managed Care – PPO | Admitting: Orthopaedic Surgery

## 2018-09-22 ENCOUNTER — Encounter: Payer: Self-pay | Admitting: Emergency Medicine

## 2018-09-22 ENCOUNTER — Other Ambulatory Visit: Payer: Self-pay

## 2018-09-22 ENCOUNTER — Telehealth (INDEPENDENT_AMBULATORY_CARE_PROVIDER_SITE_OTHER): Payer: BC Managed Care – PPO | Admitting: Emergency Medicine

## 2018-09-22 VITALS — Ht 66.0 in | Wt 348.2 lb

## 2018-09-22 DIAGNOSIS — R6889 Other general symptoms and signs: Secondary | ICD-10-CM | POA: Diagnosis not present

## 2018-09-22 DIAGNOSIS — J029 Acute pharyngitis, unspecified: Secondary | ICD-10-CM | POA: Diagnosis not present

## 2018-09-22 DIAGNOSIS — Z20822 Contact with and (suspected) exposure to covid-19: Secondary | ICD-10-CM

## 2018-09-22 MED ORDER — AZITHROMYCIN 250 MG PO TABS: ORAL_TABLET | ORAL | 0 refills | Status: DC

## 2018-09-22 MED ORDER — FLUCONAZOLE 150 MG PO TABS: 150.0000 mg | ORAL_TABLET | Freq: Once | ORAL | 0 refills | Status: AC

## 2018-09-22 NOTE — Progress Notes (Signed)
Telemedicine Encounter- SOAP NOTE Established Patient  This telephone encounter was conducted with the patient's (or proxy's) verbal consent via audio telecommunications: yes/no: Yes Patient was instructed to have this encounter in a suitably private space; and to only have persons present to whom they give permission to participate. In addition, patient identity was confirmed by use of name plus two identifiers (DOB and address).  I discussed the limitations, risks, security and privacy concerns of performing an evaluation and management service by telephone and the availability of in person appointments. I also discussed with the patient that there may be a patient responsible charge related to this service. The patient expressed understanding and agreed to proceed.  I spent a total of TIME; 0 MIN TO 60 MIN: 15 minutes talking with the patient or their proxy.  No chief complaint on file. Sore throat  Subjective   Kendra Nguyen is a 38 y.o. female established patient. Telephone visit today complaining of one-week history of sore throat, worse today, along with sneezing, coughing, mild left-sided pleuritic chest pain.  Denies difficulty breathing or wheezing.  Eating and drinking well.  Denies nausea or vomiting.  Denies fever or chills.  Dad passed away at age 62 from [place] infection on top of his COPD.  Denies any other significant symptoms.  Has "chronic sinus issues" and antibiotics help most of the times although she has a tendency of getting yeast vaginal infections.  HPI   Patient Active Problem List   Diagnosis Date Noted  . S/P gastric bypass 12/18/2014  . 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 Pure hypercholesterolemia 12/15/2013  . Vitamin D deficiency 12/15/2013  . Morbid obesity ([place]) 03/21/2011    Past Medical History:  Diagnosis Date  . Hx of laparoscopic gastric banding   . Hyperlipemia   . Hypertension    was taken of HTN meds by PCP  "years ago"  . Morbid obesity with BMI of 45.0-49.9, adult ([place])   . OCD (obsessive compulsive disorder) 04/2016  . Varicose veins     Current Outpatient Medications  Medication Sig Dispense Refill  . ALPRAZolam (XANAX) 0.5 MG tablet Take 0.5 mg by mouth at bedtime as needed for anxiety.    Marland Kitchen FLUoxetine (PROZAC) 40 MG capsule Take 40 mg by mouth daily.    . meloxicam (MOBIC) 7.5 MG tablet Take 1 tablet (7.5 mg total) by mouth daily as needed for up to 30 doses for pain. 30 tablet 2  . diclofenac sodium (VOLTAREN) 1 % GEL Apply 2 g topically 4 (four) times daily. (Patient not taking: Reported on 09/22/2018) 1 Tube 0  . fluticasone (FLONASE) 50 MCG/ACT nasal spray Place 2 sprays into both nostrils daily. (Patient not taking: Reported on 09/22/2018) 16 g 6  . levonorgestrel (MIRENA, 52 MG,) 20 MCG/24HR IUD 1 each by Intrauterine route once.    . nitroGLYCERIN (NITRODUR - DOSED IN MG/24 HR) 0.2 mg/hr patch Alternate patch every 24 hours (Patient not taking: Reported on 09/22/2018) 30 patch 12   No current facility-administered medications for this visit.     Allergies  Allergen Reactions  . Tape     rash  . Neomycin Rash    Social History   Socioeconomic History  . Marital status: Single    Spouse name: Not on file  . Number of children: Not on file  . Years of education: Master's  . Highest education level: Not on file  Occupational History  . Occupation: Pharmacist, hospital- middle school  Social Needs  . Financial resource strain: Not on file  . Food insecurity    Worry: Not on file    Inability: Not on file  . Transportation needs    Medical: Not on file    Non-medical: Not on file  Tobacco Use  . Smoking status: Never Smoker  . Smokeless tobacco: Never Used  Substance and Sexual Activity  . Alcohol use: No  . Drug use: No  . Sexual activity: Not on file  Lifestyle  . Physical activity    Days per week: Not on file    Minutes per session: Not on file  . Stress: Not on file   Relationships  . Social Herbalist on phone: Not on file    Gets together: Not on file    Attends religious service: Not on file    Active member of club or organization: Not on file    Attends meetings of clubs or organizations: Not on file    Relationship status: Not on file  . Intimate partner violence    Fear of current or ex partner: Not on file    Emotionally abused: Not on file    Physically abused: Not on file    Forced sexual activity: Not on file  Other Topics Concern  . Not on file  Social History Narrative   Lives at home by herself.   Caffeine use: Soda occass [name])   Engaged    Review of Systems  Constitutional: Negative.  Negative for chills and fever.  HENT: Positive for congestion and sore throat.   Respiratory: Positive for cough. Negative for sputum production, shortness of breath and wheezing.   Cardiovascular: Negative.  Negative for chest pain and palpitations.  Gastrointestinal: Negative for abdominal pain, blood in stool, diarrhea, nausea and vomiting.  Genitourinary: Negative.  Negative for dysuria.  Musculoskeletal: Negative for myalgias.  Skin: Negative.  Negative for rash.  Neurological: Negative for dizziness and headaches.  Endo/Heme/Allergies: Negative.   All other systems reviewed and are negative.   Objective  Alert and oriented x3 in no apparent respiratory distress. Vitals as reported by the patient: Today's Vitals   09/22/18 1649  Weight: (!) 348 lb 3.2 oz (157.9 kg)  Height: 5\' 6"  (1.676 m)    There are no diagnoses linked to this encounter.  Diagnoses and all orders for this visit:  Sore throat -     azithromycin (ZITHROMAX) 250 MG tablet; Sig as indicated -     fluconazole (DIFLUCAN) 150 MG tablet; Take 1 tablet (150 mg total) by mouth once for 1 dose.  Suspected Covid-19 Virus Infection -     Novel Coronavirus, NAA ([facility])      Clinically stable.  No red flag signs or symptoms.  COVID and ED precautions  given. I discussed the assessment and treatment plan with the patient. The patient was provided an opportunity to ask questions and all were answered. The patient agreed with the plan and demonstrated an understanding of the instructions.   The patient was advised to call back or seek an in-person evaluation if the symptoms worsen or if the condition fails to improve as anticipated.  I provided 15 minutes of non-face-to-face time during this encounter.  Horald Pollen, MD  Primary Care at [facility]

## 2018-09-23 ENCOUNTER — Other Ambulatory Visit: Payer: Self-pay

## 2018-09-23 DIAGNOSIS — Z20822 Contact with and (suspected) exposure to covid-19: Secondary | ICD-10-CM

## 2018-09-25 LAB — NOVEL CORONAVIRUS, NAA: SARS-CoV-2, NAA: NOT DETECTED

## 2018-11-19 ENCOUNTER — Other Ambulatory Visit: Payer: Self-pay

## 2018-11-19 ENCOUNTER — Ambulatory Visit (INDEPENDENT_AMBULATORY_CARE_PROVIDER_SITE_OTHER): Payer: BC Managed Care – PPO | Admitting: Emergency Medicine

## 2018-11-19 ENCOUNTER — Encounter: Payer: Self-pay | Admitting: Emergency Medicine

## 2018-11-19 VITALS — BP 130/86 | HR 74 | Temp 98.4°F | Resp 16 | Ht 66.0 in | Wt 354.6 lb

## 2018-11-19 DIAGNOSIS — R079 Chest pain, unspecified: Secondary | ICD-10-CM

## 2018-11-19 NOTE — Patient Instructions (Addendum)
   If you have lab work done today you will be contacted with your lab results within the next 2 weeks.  If you have not heard from us then please contact us. The fastest way to get your results is to register for My Chart.   IF you received an x-ray today, you will receive an invoice from [facility]. Please contact [facility] at [phone] with questions or concerns regarding your invoice.   IF you received labwork today, you will receive an invoice from [facility]. Please contact [facility] at [phone] with questions or concerns regarding your invoice.   Our billing staff will not be able to assist you with questions regarding bills from these companies.  You will be contacted with the lab results as soon as they are available. The fastest way to get your results is to activate your My Chart account. Instructions are located on the last page of this paperwork. If you have not heard from us regarding the results in 2 weeks, please contact this office.     Nonspecific Chest Pain Chest pain can be caused by many different conditions. Some causes of chest pain can be life-threatening. These will require treatment right away. Serious causes of chest pain include:  Heart attack.  A tear in the body's main blood vessel.  Redness and swelling (inflammation) around your heart.  Blood clot in your lungs. Other causes of chest pain may not be so serious. These include:  Heartburn.  Anxiety or stress.  Damage to bones or muscles in your chest.  Lung infections. Chest pain can feel like:  Pain or discomfort in your chest.  Crushing, pressure, aching, or squeezing pain.  Burning or tingling.  Dull or sharp pain that is worse when you move, cough, or take a deep breath.  Pain or discomfort that is also felt in your back, neck, jaw, shoulder, or arm, or pain that spreads to any of these areas. It is hard to know whether your pain is caused by something that is  serious or something that is not so serious. So it is important to see your doctor right away if you have chest pain. Follow these instructions at home: Medicines  Take over-the-counter and prescription medicines only as told by your doctor.  If you were prescribed an antibiotic medicine, take it as told by your doctor. Do not stop taking the antibiotic even if you start to feel better. Lifestyle   Rest as told by your doctor.  Do not use any products that contain nicotine or tobacco, such as cigarettes, e-cigarettes, and chewing tobacco. If you need help quitting, ask your doctor.  Do not drink alcohol.  Make lifestyle changes as told by your doctor. These may include: ? Getting regular exercise. Ask your doctor what activities are safe for you. ? Eating a heart-healthy diet. A diet and nutrition specialist (dietitian) can help you to learn healthy eating options. ? Staying at a healthy weight. ? Treating diabetes or high blood pressure, if needed. ? Lowering your stress. Activities such as yoga and relaxation techniques can help. General instructions  Pay attention to any changes in your symptoms. Tell your doctor about them or any new symptoms.  Avoid any activities that cause chest pain.  Keep all follow-up visits as told by your doctor. This is important. You may need more testing if your chest pain does not go away. Contact a doctor if:  Your chest pain does not go away.  You feel   depressed.  You have a fever. Get help right away if:  Your chest pain is worse.  You have a cough that gets worse, or you cough up blood.  You have very bad (severe) pain in your belly (abdomen).  You pass out (faint).  You have either of these for no clear reason: ? Sudden chest discomfort. ? Sudden discomfort in your arms, back, neck, or jaw.  You have shortness of breath at any time.  You suddenly start to sweat, or your skin gets clammy.  You feel sick to your stomach  (nauseous).  You throw up (vomit).  You suddenly feel lightheaded or dizzy.  You feel very weak or tired.  Your heart starts to beat fast, or it feels like it is skipping beats. These symptoms may be an emergency. Do not wait to see if the symptoms will go away. Get medical help right away. Call your local emergency services (911 in the [place] Do not drive yourself to the hospital. Summary  Chest pain can be caused by many different conditions. The cause may be serious and need treatment right away. If you have chest pain, see your doctor right away.  Follow your doctor's instructions for taking medicines and making lifestyle changes.  Keep all follow-up visits as told by your doctor. This includes visits for any further testing if your chest pain does not go away.  Be sure to know the signs that show that your condition has become worse. Get help right away if you have these symptoms. This information is not intended to replace advice given to you by your health care provider. Make sure you discuss any questions you have with your health care provider. Document Released: 06/26/2007 Document Revised: 07/10/2017 Document Reviewed: 07/10/2017 Elsevier Patient Education  2020 [employer].

## 2018-11-19 NOTE — Progress Notes (Signed)
Kendra Nguyen 38 y.o.   Chief Complaint  Patient presents with  . Chest Pain    x 3 days upper left part of the chest  . Headache    on and off for a couple of weeks    HISTORY OF PRESENT ILLNESS: This is a 38 y.o. female complaining of sharp left upper chest pain for the past 3 days without associated symptoms.  No radiation.  No cardiac history.  No diabetes.  Patient has been stressed out recently due to upcoming wedding 10 days from now.  HPI   Prior to Admission medications   Medication Sig Start Date End Date Taking? Authorizing Provider  ALPRAZolam Duanne Moron) 0.5 MG tablet Take 0.5 mg by mouth at bedtime as needed for anxiety.   Yes [provider]  FLUoxetine (PROZAC) 40 MG capsule Take 40 mg by mouth daily.   Yes [provider]  diclofenac sodium (VOLTAREN) 1 % GEL Apply 2 g topically 4 (four) times daily. Patient not taking: Reported on 11/19/2018 09/16/17   Aundra Dubin, PA-C  fluticasone [facility]) 50 MCG/ACT nasal spray Place 2 sprays into both nostrils daily. Patient not taking: Reported on 11/19/2018 08/21/17   McVey, Gelene Mink, PA-C  levonorgestrel (MIRENA, 52 MG,) 20 MCG/24HR IUD 1 each by Intrauterine route once.    [provider]  meloxicam (MOBIC) 7.5 MG tablet Take 1 tablet (7.5 mg total) by mouth daily as needed for up to 30 doses for pain. Patient not taking: Reported on 11/19/2018 09/16/17   Aundra Dubin, PA-C  nitroGLYCERIN (NITRODUR - DOSED IN MG/24 HR) 0.2 mg/hr patch Alternate patch every 24 hours Patient not taking: Reported on 11/19/2018 03/24/18   Aundra Dubin, PA-C    Allergies  Allergen Reactions  . Tape     rash  . Neomycin Rash    Patient Active Problem List   Diagnosis Date Noted  . S/P gastric bypass 12/18/2014  . 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 Pure hypercholesterolemia 12/15/2013  . Vitamin D deficiency 12/15/2013  . Morbid obesity ([place]) 03/21/2011    Past  Medical History:  Diagnosis Date  . Hx of laparoscopic gastric banding   . Hyperlipemia   . Hypertension    was taken of HTN meds by PCP "years ago"  . Morbid obesity with BMI of 45.0-49.9, adult ([place])   . OCD (obsessive compulsive disorder) 04/2016  . Varicose veins     Past Surgical History:  Procedure Laterality Date  . CESAREAN SECTION N/A 01/18/2016   Procedure: CESAREAN SECTION;  Surgeon: Everett Graff, MD;  Location: [place];  Service: Obstetrics;  Laterality: N/A;  . COLONOSCOPY WITH PROPOFOL N/A 11/24/2014   Procedure: COLONOSCOPY WITH PROPOFOL;  Surgeon: Milus Banister, MD;  Location: [facility];  Service: Endoscopy;  Laterality: N/A;  . LAPAROSCOPIC GASTRIC BANDING  04/17/2011  . [place] RESECTION     2016  . WISDOM TOOTH EXTRACTION      Social History   Socioeconomic History  . Marital status: Single    Spouse name: Not on file  . Number of children: Not on file  . Years of education: Master's  . Highest education level: Not on file  Occupational History  . Occupation: Pharmacist, hospital- middle school  Social Needs  . Financial resource strain: Not on file  . Food insecurity    Worry: Not on file    Inability: Not on file  . Transportation needs    Medical:  Not on file    Non-medical: Not on file  Tobacco Use  . Smoking status: Never Smoker  . Smokeless tobacco: Never Used  Substance and Sexual Activity  . Alcohol use: No  . Drug use: No  . Sexual activity: Not on file  Lifestyle  . Physical activity    Days per week: Not on file    Minutes per session: Not on file  . Stress: Not on file  Relationships  . Social Herbalist on phone: Not on file    Gets together: Not on file    Attends religious service: Not on file    Active member of club or organization: Not on file    Attends meetings of clubs or organizations: Not on file    Relationship status: Not on file  . Intimate partner violence    Fear of current or  ex partner: Not on file    Emotionally abused: Not on file    Physically abused: Not on file    Forced sexual activity: Not on file  Other Topics Concern  . Not on file  Social History Narrative   Lives at home by herself.   Caffeine use: Soda occass [name])   Engaged    Family History  Problem Relation Age of Onset  . Diabetes Mother   . Heart disease Mother   . Arthritis Father        gout  . COPD Father        smoker  . Hyperlipidemia Father   . Cancer Father        stomach  . Aneurysm Sister 34       cerebral;   . COPD Paternal 11   . Heart disease Maternal Grandmother   . Heart disease Maternal Grandfather   . Neuropathy Neg Hx      Review of Systems  Constitutional: Negative.  Negative for chills and fever.  HENT: Negative.  Negative for congestion and sore throat.   Respiratory: Negative.  Negative for cough, hemoptysis and shortness of breath.   Cardiovascular: Positive for chest pain. Negative for palpitations.  Gastrointestinal: Negative.  Negative for abdominal pain, blood in stool, diarrhea, heartburn, nausea and vomiting.  Genitourinary: Negative.  Negative for dysuria and hematuria.  Musculoskeletal: Negative for back pain and neck pain.  Skin: Negative.  Negative for rash.  Neurological: Negative.  Negative for dizziness and headaches.  All other systems reviewed and are negative.  Today's Vitals   11/19/18 1620  BP: 130/86  Pulse: 74  Resp: 16  Temp: 98.4 F (36.9 C)  TempSrc: Oral  SpO2: 99%  Weight: (!) 354 lb 9.6 oz (160.8 kg)  Height: 5\' 6"  (1.676 m)   Body mass index is 57.23 kg/m.   Physical Exam Vitals signs reviewed.  Constitutional:      Appearance: Normal appearance. She is obese.  HENT:     Head: Normocephalic.  Eyes:     Extraocular Movements: Extraocular movements intact.     Conjunctiva/sclera: Conjunctivae normal.     Pupils: Pupils are equal, round, and reactive to light.  Neck:     Musculoskeletal: Normal range  of motion and neck supple.  Cardiovascular:     Rate and Rhythm: Normal rate and regular rhythm.     Pulses: Normal pulses.     Heart sounds: Normal heart sounds.  Pulmonary:     Effort: Pulmonary effort is normal.     Breath sounds: Normal breath sounds.  Chest:  Chest wall: No tenderness.  Abdominal:     Palpations: Abdomen is soft.     Tenderness: There is no abdominal tenderness.  Musculoskeletal: Normal range of motion.     Right lower leg: No edema.     Left lower leg: No edema.  Skin:    General: Skin is warm and dry.     Capillary Refill: Capillary refill takes less than 2 seconds.  Neurological:     General: No focal deficit present.     Mental Status: She is alert and oriented to person, place, and time.  Psychiatric:        Mood and Affect: Mood normal.        Behavior: Behavior normal.     EKG: Normal sinus rhythm with ventricular rate of 66/min.  No acute ischemic changes.  Normal EKG ASSESSMENT & PLAN: [place] was seen today for chest pain and headache.  Diagnoses and all orders for this visit:  Chest pain, unspecified type -     EKG 12-Lead   Clinically stable.  No major cardiac risk factors.  Normal EKG.  Doubt cardiac etiology.  Most likely musculoskeletal chest pain.  Unlikely PE.   Patient Instructions       If you have lab work done today you will be contacted with your lab results within the next 2 weeks.  If you have not heard from [place] then please contact us. The fastest way to get your results is to register for My Chart.   IF you received an x-ray today, you will receive an invoice from [facility]. Please contact [facility] at [phone] with questions or concerns regarding your invoice.   IF you received labwork today, you will receive an invoice from [facility]. Please contact [facility] at [phone] with questions or concerns regarding your invoice.   Our billing staff will not be able to assist you with questions  regarding bills from these companies.  You will be contacted with the lab results as soon as they are available. The fastest way to get your results is to activate your My Chart account. Instructions are located on the last page of this paperwork. If you have not heard from [place] regarding the results in 2 weeks, please contact this office.     Nonspecific Chest Pain Chest pain can be caused by many different conditions. Some causes of chest pain can be life-threatening. These will require treatment right away. Serious causes of chest pain include:  Heart attack.  A tear in the body's main blood vessel.  Redness and swelling (inflammation) around your heart.  Blood clot in your lungs. Other causes of chest pain may not be so serious. These include:  Heartburn.  Anxiety or stress.  Damage to bones or muscles in your chest.  Lung infections. Chest pain can feel like:  Pain or discomfort in your chest.  Crushing, pressure, aching, or squeezing pain.  Burning or tingling.  Dull or sharp pain that is worse when you move, cough, or take a deep breath.  Pain or discomfort that is also felt in your back, neck, jaw, shoulder, or arm, or pain that spreads to any of these areas. It is hard to know whether your pain is caused by something that is serious or something that is not so serious. So it is important to see your doctor right away if you have chest pain. Follow these instructions at home: Medicines  Take over-the-counter and prescription medicines only as told by your doctor.  If you were prescribed an antibiotic medicine, take it as told by your doctor. Do not stop taking the antibiotic even if you start to feel better. Lifestyle   Rest as told by your doctor.  Do not use any products that contain nicotine or tobacco, such as cigarettes, e-cigarettes, and chewing tobacco. If you need help quitting, ask your doctor.  Do not drink alcohol.  Make lifestyle changes as told  by your doctor. These may include: ? Getting regular exercise. Ask your doctor what activities are safe for you. ? Eating a heart-healthy diet. A diet and nutrition specialist (dietitian) can help you to learn healthy eating options. ? Staying at a healthy weight. ? Treating diabetes or high blood pressure, if needed. ? Lowering your stress. Activities such as yoga and relaxation techniques can help. General instructions  Pay attention to any changes in your symptoms. Tell your doctor about them or any new symptoms.  Avoid any activities that cause chest pain.  Keep all follow-up visits as told by your doctor. This is important. You may need more testing if your chest pain does not go away. Contact a doctor if:  Your chest pain does not go away.  You feel depressed.  You have a fever. Get help right away if:  Your chest pain is worse.  You have a cough that gets worse, or you cough up blood.  You have very bad (severe) pain in your belly (abdomen).  You pass out (faint).  You have either of these for no clear reason: ? Sudden chest discomfort. ? Sudden discomfort in your arms, back, neck, or jaw.  You have shortness of breath at any time.  You suddenly start to sweat, or your skin gets clammy.  You feel sick to your stomach (nauseous).  You throw up (vomit).  You suddenly feel lightheaded or dizzy.  You feel very weak or tired.  Your heart starts to beat fast, or it feels like it is skipping beats. These symptoms may be an emergency. Do not wait to see if the symptoms will go away. Get medical help right away. Call your local emergency services (911 in the [place] Do not drive yourself to the hospital. Summary  Chest pain can be caused by many different conditions. The cause may be serious and need treatment right away. If you have chest pain, see your doctor right away.  Follow your doctor's instructions for taking medicines and making lifestyle changes.  Keep  all follow-up visits as told by your doctor. This includes visits for any further testing if your chest pain does not go away.  Be sure to know the signs that show that your condition has become worse. Get help right away if you have these symptoms. This information is not intended to replace advice given to you by your health care provider. Make sure you discuss any questions you have with your health care provider. Document Released: 06/26/2007 Document Revised: 07/10/2017 Document Reviewed: 07/10/2017 Elsevier Patient Education  2020 [employer]      Agustina Caroli, MD [facility]

## 2019-01-04 ENCOUNTER — Emergency Department
Admission: EM | Admit: 2019-01-04 | Discharge: 2019-01-04 | Disposition: A | Discharge status: Home / Self Care | Payer: BC Managed Care – PPO | Attending: Emergency Medicine | Admitting: Emergency Medicine

## 2019-01-04 ENCOUNTER — Encounter: Payer: Self-pay | Admitting: Emergency Medicine

## 2019-01-04 ENCOUNTER — Other Ambulatory Visit: Payer: Self-pay

## 2019-01-04 ENCOUNTER — Emergency Department: Payer: BC Managed Care – PPO

## 2019-01-04 DIAGNOSIS — Z79899 Other long term (current) drug therapy: Secondary | ICD-10-CM | POA: Diagnosis not present

## 2019-01-04 DIAGNOSIS — M79605 Pain in left leg: Secondary | ICD-10-CM

## 2019-01-04 DIAGNOSIS — M79652 Pain in left thigh: Secondary | ICD-10-CM | POA: Diagnosis not present

## 2019-01-04 DIAGNOSIS — I10 Essential (primary) hypertension: Secondary | ICD-10-CM | POA: Insufficient documentation

## 2019-01-04 DIAGNOSIS — Z975 Presence of (intrauterine) contraceptive device: Secondary | ICD-10-CM | POA: Insufficient documentation

## 2019-01-04 NOTE — ED Notes (Signed)
See triage note  Presents with pain to left upper leg   States pain is mainly to anterior   And moves into knee

## 2019-01-04 NOTE — ED Provider Notes (Signed)
[facility] Emergency Department Provider Note  ____________________________________________   First MD Initiated Contact with Patient 01/04/19 0710     (approximate)  I have reviewed the triage vital signs and the nursing notes.   HISTORY  Chief Complaint Leg Pain   HPI Kendra Nguyen is a 38 y.o. female presents to the ED with complaint of left lower extremity pain for the last 2 weeks.  Patient states that the pain is intermittent and worsens with walking.  Patient states for the last 2 days leg pain has become more frequent and stays to mainly the anterior portion of her thigh.  Patient has been taking Tylenol.  She is very anxious stating that her mother recently passed from a DVT in her leg.  She states her doctor advised her to come to the ED to be evaluated for DVT.  Patient denies any recent trauma to her leg, traveling or previous DVT.  Patient is non-smoker.  She rates her pain as 7 out of 10.      Past Medical History:  Diagnosis Date  . Hx of laparoscopic gastric banding   . Hyperlipemia   . Hypertension    was taken of HTN meds by PCP "years ago"  . Morbid obesity with BMI of 45.0-49.9, adult ([place])   . OCD (obsessive compulsive disorder) 04/2016  . Varicose veins     Patient Active Problem List   Diagnosis Date Noted  . S/P gastric bypass 12/18/2014  . 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 Pure hypercholesterolemia 12/15/2013  . Vitamin D deficiency 12/15/2013  . Morbid obesity ([place]) 03/21/2011    Past Surgical History:  Procedure Laterality Date  . CESAREAN SECTION N/A 01/18/2016   Procedure: CESAREAN SECTION;  Surgeon: Everett Graff, MD;  Location: [place];  Service: Obstetrics;  Laterality: N/A;  . COLONOSCOPY WITH PROPOFOL N/A 11/24/2014   Procedure: COLONOSCOPY WITH PROPOFOL;  Surgeon: Milus Banister, MD;  Location: [facility];  Service: Endoscopy;  Laterality: N/A;  . LAPAROSCOPIC  GASTRIC BANDING  04/17/2011  . [place] RESECTION     2016  . WISDOM TOOTH EXTRACTION      Prior to Admission medications   Medication Sig Start Date End Date Taking? Authorizing Provider  ALPRAZolam Duanne Moron) 0.5 MG tablet Take 0.5 mg by mouth at bedtime as needed for anxiety.    [provider]  FLUoxetine (PROZAC) 40 MG capsule Take 40 mg by mouth daily.    [provider]  levonorgestrel (MIRENA, 52 MG,) 20 MCG/24HR IUD 1 each by Intrauterine route once.    [provider]    Allergies Tape and Neomycin  Family History  Problem Relation Age of Onset  . Diabetes Mother   . Heart disease Mother   . Arthritis Father        gout  . COPD Father        smoker  . Hyperlipidemia Father   . Cancer Father        stomach  . Aneurysm Sister 34       cerebral;   . COPD Paternal 66   . Heart disease Maternal Grandmother   . Heart disease Maternal Grandfather   . Neuropathy Neg Hx     Social History Social History   Tobacco Use  . Smoking status: Never Smoker  . Smokeless tobacco: Never Used  Substance Use Topics  . Alcohol use: Yes    Comment: occ  . Drug use: No  Review of Systems Constitutional: No fever/chills Eyes: No visual changes. Cardiovascular: Denies chest pain. Respiratory: Denies shortness of breath. Musculoskeletal: Left lower extremity pain. Skin: Negative for rash. Neurological: Negative for headaches, focal weakness or numbness. ___________________________________________   PHYSICAL EXAM:  VITAL SIGNS: ED Triage Vitals  Enc Vitals Group     BP 01/04/19 0330 (!) 152/87     Pulse Rate 01/04/19 0330 83     Resp 01/04/19 0330 18     Temp 01/04/19 0330 99.1 F (37.3 C)     Temp Source 01/04/19 0330 Oral     SpO2 01/04/19 0330 99 %     Weight 01/04/19 0331 (!) 356 lb 9.6 oz (161.8 kg)     Height 01/04/19 0331 5\' 6"  (1.676 m)     Head Circumference --      Peak Flow --      Pain Score 01/04/19 0331  7     Pain Loc --      Pain Edu? --      Excl. in [place]? --     Constitutional: Alert and oriented. Well appearing and in no acute distress.  Morbidly obese. Eyes: Conjunctivae are normal.  Head: Atraumatic. Neck: No stridor.   Cardiovascular: Normal rate, regular rhythm. Grossly normal heart sounds.  Good peripheral circulation. Respiratory: Normal respiratory effort.  No retractions. Lungs CTAB. Musculoskeletal: Examination of the left lower extremity there is no warmth or erythema noted on exam.  There is diffuse tenderness on palpation.  No gross edema is noted in comparison with the right lower extremity.  Skin is intact and no discoloration noted.  No effusion noted to the left knee.  Patient was able to ambulate at the time of discharge. Neurologic:  Normal speech and language. No gross focal neurologic deficits are appreciated.  Skin:  Skin is warm, dry and intact. No rash noted. Psychiatric: Mood and affect are normal. Speech and behavior are normal.  ____________________________________________   LABS (all labs ordered are listed, but only abnormal results are displayed)  Labs Reviewed - No data to display   RADIOLOGY  Official radiology report(s): US Venous Img Lower Unilateral Left  Result Date: 01/04/2019 CLINICAL DATA:  Initial evaluation for acute left leg pain. EXAM: LEFT LOWER EXTREMITY VENOUS DOPPLER ULTRASOUND TECHNIQUE: Gray-scale sonography with graded compression, as well as color Doppler and duplex ultrasound were performed to evaluate the lower extremity deep venous systems from the level of the common femoral vein and including the common femoral, femoral, profunda femoral, popliteal and calf veins including the posterior tibial, peroneal and gastrocnemius veins when visible. The superficial great saphenous vein was also interrogated. Spectral Doppler was utilized to evaluate flow at rest and with distal augmentation maneuvers in the common femoral, femoral and  popliteal veins. COMPARISON:  None. FINDINGS: Contralateral Common Femoral Vein: Respiratory phasicity is normal and symmetric with the symptomatic side. No evidence of thrombus. Normal compressibility. Common Femoral Vein: No evidence of thrombus. Normal compressibility, respiratory phasicity and response to augmentation. Saphenofemoral Junction: No evidence of thrombus. Normal compressibility and flow on color Doppler imaging. Profunda Femoral Vein: No evidence of thrombus. Normal compressibility and flow on color Doppler imaging. Femoral Vein: No evidence of thrombus. Normal compressibility, respiratory phasicity and response to augmentation. Popliteal Vein: No evidence of thrombus. Normal compressibility, respiratory phasicity and response to augmentation. Calf Veins: No evidence of thrombus. Normal compressibility and flow on color Doppler imaging. Superficial Great Saphenous Vein: No evidence of thrombus. Normal compressibility. Venous Reflux:  None. Other  Findings:  None. IMPRESSION: No evidence of deep venous thrombosis. Electronically Signed   By: Jeannine Boga M.D.   On: 01/04/2019 04:29    ____________________________________________   PROCEDURES  Procedure(s) performed (including Critical Care):  Procedures   ____________________________________________   INITIAL IMPRESSION / ASSESSMENT AND PLAN / ED COURSE  As part of my medical decision making, I reviewed the following data within the electronic MEDICAL RECORD NUMBER Notes from prior ED visits and [place] Controlled Substance Database  38 year old female presents with intermittent left lower extremity pain for 2 weeks worsening over the last 2 days.  Patient denies any trauma, recent travel or history of DVT.  She is anxious and that her mother recently passed away from a DVT in her leg.  She was advised by her PCP to have this evaluated.  Patient ultrasound was negative for DVT and patient was reassured.  In looking through her  records she has had gastric banding and gastric sleeve resection.  She is advised to continue taking the Tylenol as needed for her leg and also warm moist compresses to her leg as needed for discomfort.  She is to follow-up with her PCP if any continued problems. ____________________________________________   FINAL CLINICAL IMPRESSION(S) / ED DIAGNOSES  Final diagnoses:  Left leg pain     ED Discharge Orders    None       Note:  This document was prepared using Dragon voice recognition software and may include unintentional dictation errors.    Johnn Hai, PA-C 01/04/19 0827    Nena Polio, MD 01/04/19 [phone]

## 2019-01-04 NOTE — ED Triage Notes (Addendum)
Patient with complaint of intermittent left lower and upper leg pain times two weeks. Patient states that the pain in her upper leg has become more consistent over the past two days. Patient states her md had her to come to be evaluated for a DVT. Patient states that her mother recently passed away from a DVT in her leg.

## 2019-01-04 NOTE — Discharge Instructions (Addendum)
Follow-up with your primary care provider if any continued problems.  You may use warm moist compresses to your leg as needed for discomfort.  Continue taking Tylenol as needed for pain.  Your ultrasound today does not show any blood clots to your leg.

## 2019-01-13 ENCOUNTER — Ambulatory Visit (INDEPENDENT_AMBULATORY_CARE_PROVIDER_SITE_OTHER): Payer: BC Managed Care – PPO

## 2019-01-13 ENCOUNTER — Ambulatory Visit: Payer: BC Managed Care – PPO | Admitting: Family Medicine

## 2019-01-13 ENCOUNTER — Other Ambulatory Visit: Payer: Self-pay

## 2019-01-13 ENCOUNTER — Encounter: Payer: Self-pay | Admitting: Family Medicine

## 2019-01-13 VITALS — BP 131/84 | HR 78 | Temp 98.2°F | Ht 66.0 in | Wt 369.2 lb

## 2019-01-13 DIAGNOSIS — M79676 Pain in unspecified toe(s): Secondary | ICD-10-CM

## 2019-01-13 DIAGNOSIS — M79674 Pain in right toe(s): Secondary | ICD-10-CM

## 2019-01-13 DIAGNOSIS — S92511A Displaced fracture of proximal phalanx of right lesser toe(s), initial encounter for closed fracture: Secondary | ICD-10-CM | POA: Diagnosis not present

## 2019-01-13 NOTE — Patient Instructions (Addendum)
  Elevate or apply ice if swollen or painful  Wear a stiff soled shoe the postop shoe that we ordered.  Tylenol 500 mg 2 pills 3 times daily and/or ibuprofen 200 mg 3 pills 3 times daily if needed for pain  Return if any problems or concerns  Plan to see Dr. Mitchel Honour or me in about 1 month to make sure everything is doing well.    If you have lab work done today you will be contacted with your lab results within the next 2 weeks.  If you have not heard from [place] then please contact us. The fastest way to get your results is to register for My Chart.   IF you received an x-ray today, you will receive an invoice from [facility]. Please contact [facility] at [phone] with questions or concerns regarding your invoice.   IF you received labwork today, you will receive an invoice from [place]. Please contact [facility] at [phone] with questions or concerns regarding your invoice.   Our billing staff will not be able to assist you with questions regarding bills from these companies.  You will be contacted with the lab results as soon as they are available. The fastest way to get your results is to activate your My Chart account. Instructions are located on the last page of this paperwork. If you have not heard from [place] regarding the results in 2 weeks, please contact this office.

## 2019-01-13 NOTE — Progress Notes (Signed)
Patient ID: Kendra Nguyen, female    DOB: 08/14/1980  Age: 38 y.o. MRN: [id number]  Chief Complaint  Patient presents with  . right pinky toe    1.5 hr ago stubbed on Door     Subjective:   Patient stubbed her toe on the bathroom door this morning a couple of hours ago.  It is very painful.  She is able to walk on it.  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  Had a fracture of her ankle before but no other bone problems.  She just had her Mirena removed on Monday and is not pregnant.  Current allergies, medications, problem list, past/family and social histories reviewed.  Objective:  BP 131/84   Pulse 78   Temp 98.2 F (36.8 C) (Temporal)   Ht 5\' 6"  (1.676 m)   Wt (!) 369 lb 3.2 oz (167.5 kg)   SpO2 97%   BMI 59.59 kg/m   Pleasant lady, morbidly obese.  Toe has no gross evidence of deformity.  She is not tender along the metatarsal.  She is tender all along the MTP joint as well as the proximal phalanx of the fifth toe.  Assessment & Plan:   Assessment: 1. Closed fracture of proximal phalanx of lesser toe of right foot, physeal involvement unspecified, initial encounter   2. Pain of fifth toe       Plan: Check x-ray. Patient ID: Kendra Nguyen, female    DOB: August 07, 1980  Age: 38 y.o. MRN: [id number]  Chief Complaint  Patient presents with  . right pinky toe    1.5 hr ago stubbed on Door     Subjective:   See above  Current allergies, medications, problem list, past/family and social histories reviewed.  Objective:  BP 131/84   Pulse 78   Temp 98.2 F (36.8 C) (Temporal)   Ht 5\' 6"  (1.676 m)   Wt (!) 369 lb 3.2 oz (167.5 kg)   SpO2 97%   BMI 59.59 kg/m   See above  Assessment & Plan:   Assessment: 1. Closed fracture of proximal phalanx of lesser toe of right foot, physeal involvement unspecified, initial encounter   2. Pain of fifth toe       Plan: X-ray IMPRESSION: Nondisplaced fracture within the distal portion of the  proximal phalanx of the fifth toe. No displaced fracture. No joint dislocation. Orders Placed This Encounter  Procedures  . Post-op shoe  . DG Toe 5th Right    Standing Status:   Future    Number of Occurrences:   1    Standing Expiration Date:   01/13/2020    Order Specific Question:   Reason for Exam (SYMPTOM  OR DIAGNOSIS REQUIRED)    Answer:   pain, stubbed toe, prox phalynx tender but not deformed    Order Specific Question:   Is the patient pregnant?    Answer:   No    Order Specific Question:   Preferred imaging location?    Answer:   External    No orders of the defined types were placed in this encounter.        Patient Instructions    Elevate or apply ice if swollen or painful  Wear a stiff soled shoe the postop shoe that we ordered.  Tylenol 500 mg 2 pills 3 times daily and/or ibuprofen 200 mg 3 pills 3 times daily if needed for pain  Return if any problems or concerns  Plan to see Dr. Mitchel Honour or me in about 1 month to make sure everything is doing well.    If you have lab work done today you will be contacted with your lab results within the next 2 weeks.  If you have not heard from [place] then please contact us. The fastest way to get your results is to register for My Chart.   IF you received an x-ray today, you will receive an invoice from [facility]. Please contact [facility] at [phone] with questions or concerns regarding your invoice.   IF you received labwork today, you will receive an invoice from [place]. Please contact [facility] at [phone] with questions or concerns regarding your invoice.   Our billing staff will not be able to assist you with questions regarding bills from these companies.  You will be contacted with the lab results as soon as they are available. The fastest way to get your results is to activate your My Chart account. Instructions are located on the last page of this paperwork. If you have not heard  from [place] regarding the results in 2 weeks, please contact this office.         Return in about 1 year (around 01/13/2020).   Ruben Reason, MD 01/13/2019  Orders Placed This Encounter  Procedures  . Post-op shoe  . DG Toe 5th Right    Standing Status:   Future    Number of Occurrences:   1    Standing Expiration Date:   01/13/2020    Order Specific Question:   Reason for Exam (SYMPTOM  OR DIAGNOSIS REQUIRED)    Answer:   pain, stubbed toe, prox phalynx tender but not deformed    Order Specific Question:   Is the patient pregnant?    Answer:   No    Order Specific Question:   Preferred imaging location?    Answer:   External    No orders of the defined types were placed in this encounter.        Patient Instructions    Elevate or apply ice if swollen or painful  Wear a stiff soled shoe the postop shoe that we ordered.  Tylenol 500 mg 2 pills 3 times daily and/or ibuprofen 200 mg 3 pills 3 times daily if needed for pain  Return if any problems or concerns  Plan to see Dr. Mitchel Honour or me in about 1 month to make sure everything is doing well.    If you have lab work done today you will be contacted with your lab results within the next 2 weeks.  If you have not heard from [place] then please contact us. The fastest way to get your results is to register for My Chart.   IF you received an x-ray today, you will receive an invoice from [facility]. Please contact [facility] at [phone] with questions or concerns regarding your invoice.   IF you received labwork today, you will receive an invoice from [place]. Please contact [facility] at [phone] with questions or concerns regarding your invoice.   Our billing staff will not be able to assist you with questions regarding bills from these companies.  You will be contacted with the lab results as soon as they are available. The fastest way to get your results is to activate your My Chart account.  Instructions are located on the last page of this paperwork. If you have not heard from [place] regarding the results in 2 weeks, please contact  this office.         Return in about 1 year (around 01/13/2020).   Ruben Reason, MD 01/13/2019

## 2019-02-11 ENCOUNTER — Encounter: Payer: Self-pay | Admitting: Emergency Medicine

## 2019-02-11 ENCOUNTER — Ambulatory Visit: Payer: BC Managed Care – PPO | Admitting: Emergency Medicine

## 2019-02-11 ENCOUNTER — Other Ambulatory Visit: Payer: Self-pay

## 2019-02-11 VITALS — BP 120/81 | HR 76 | Temp 98.3°F | Resp 16 | Ht 66.0 in | Wt 368.0 lb

## 2019-02-11 DIAGNOSIS — S92504D Nondisplaced unspecified fracture of right lesser toe(s), subsequent encounter for fracture with routine healing: Secondary | ICD-10-CM

## 2019-02-11 DIAGNOSIS — Z111 Encounter for screening for respiratory tuberculosis: Secondary | ICD-10-CM

## 2019-02-11 DIAGNOSIS — Z23 Encounter for immunization: Secondary | ICD-10-CM

## 2019-02-11 NOTE — Patient Instructions (Addendum)
   If you have lab work done today you will be contacted with your lab results within the next 2 weeks.  If you have not heard from us then please contact us. The fastest way to get your results is to register for My Chart.   IF you received an x-ray today, you will receive an invoice from [facility]. Please contact [place] [facility] at [phone] with questions or concerns regarding your invoice.   IF you received labwork today, you will receive an invoice from [facility]. Please contact [facility] at [phone] with questions or concerns regarding your invoice.   Our billing staff will not be able to assist you with questions regarding bills from these companies.  You will be contacted with the lab results as soon as they are available. The fastest way to get your results is to activate your My Chart account. Instructions are located on the last page of this paperwork. If you have not heard from us regarding the results in 2 weeks, please contact this office.      Toe Fracture A toe fracture is a break in one of the toe bones (phalanges). This may happen if you:  Drop a heavy object on your toe.  Stub your toe.  Twist your toe.  Exercise the same way too much. What are the signs or symptoms? The main symptoms are swelling and pain in the toe. You may also have:  Bruising.  Stiffness.  Numbness.  A change in the way the toe looks.  Broken bones that poke through the skin.  Blood under the toenail. How is this treated? Treatments may include:  Taping the broken toe to a toe that is next to it (buddy taping).  Wearing a shoe that has a wide, rigid sole to protect the toe and to limit its movement.  Wearing a cast.  Surgery. This may be needed if the: ? Pieces of broken bone are out of place. ? Bone pokes through the skin.  Physical therapy. Follow these instructions at home: If you have a shoe:  Wear the shoe as told by your doctor. Remove it  only as told by your doctor.  Loosen the shoe if your toes tingle, become numb, or turn cold and blue.  Keep the shoe clean and dry. If you have a cast:  Do not put pressure on any part of the cast until it is fully hardened. This may take a few hours.  Do not stick anything inside the cast to scratch your skin.  Check the skin around the cast every day. Tell your doctor about any concerns.  You may put lotion on dry skin around the edges of the cast.  Do not put lotion on the skin under the cast.  Keep the cast clean and dry. Bathing  Do not take baths, swim, or use a hot tub until your doctor says it is okay. Ask your doctor if you can take showers.  If the shoe or cast is not waterproof: ? Do not let it get wet. ? Cover it with a watertight covering when you take a bath or a shower. Activity  Do not use your foot to support your body weight until your doctor says it is okay.  Use crutches as told by your doctor.  Ask your doctor what activities are safe for you during recovery.  Avoid activities as told by your doctor.  Do exercises as told by your doctor or therapist. Driving  Do not   drive or use heavy machinery while taking pain medicine.  Do not drive while wearing a cast on a foot that you use for driving. Managing pain, stiffness, and swelling   Put ice on the injured area if told by your doctor: ? Put ice in a plastic bag. ? Place a towel between your skin and the bag.  If you have a shoe, remove it as told by your doctor.  If you have a cast, place a towel between your cast and the bag. ? Leave the ice on for 20 minutes, 2-3 times per day.  Raise (elevate) the injured area above the level of your heart while you are sitting or lying down. General instructions  If your toe was taped to a toe that is next to it, follow your doctor's instructions for changing the gauze and tape. Change it more often: ? If the gauze and tape get wet. If this happens, dry  the space between the toes. ? If the gauze and tape are too tight and they cause your toe to become pale or to lose feeling (go numb).  If your doctor did not give you a protective shoe, wear sturdy shoes that support your foot. Your shoes should not: ? Pinch your toes. ? Fit tightly against your toes.  Do not use any tobacco products, including cigarettes, chewing tobacco, or e-cigarettes. These can delay bone healing. If you need help quitting, ask your doctor.  Take medicines only as told by your doctor.  Keep all follow-up visits as told by your doctor. This is important. Contact a doctor if:  Your pain medicine is not helping.  You have a fever.  You notice a bad smell coming from your cast. Get help right away if:  You lose feeling (have numbness) in your toe or foot, and it is getting worse.  Your toe or your foot tingles.  Your toe or your foot gets cold or turns blue.  You have redness or swelling in your toe or foot, and it is getting worse.  You have very bad pain. Summary  A toe fracture is a break in one of the toe bones.  Use ice and raise your foot. This will help lessen pain and swelling.  Use crutches as told by your doctor. This information is not intended to replace advice given to you by your health care provider. Make sure you discuss any questions you have with your health care provider. Document Revised: 03/13/2017 Document Reviewed: 02/18/2017 Elsevier Patient Education  2020 [employer]  

## 2019-02-11 NOTE — Progress Notes (Signed)
Kendra Nguyen 39 y.o.   Chief Complaint  Patient presents with  . FRACTURE  TOE    RIGHT 5TH follow up    HISTORY OF PRESENT ILLNESS: This is a 39 y.o. female here for follow-up of right fifth toe fracture sustained 4 weeks ago.  Doing well and no complaints. Also needs blood test for tuberculosis screening and requesting flu vaccine.  HPI   Prior to Admission medications   Medication Sig Start Date End Date Taking? Authorizing Provider  ALPRAZolam Kendra Nguyen) 0.5 MG tablet Take 0.5 mg by mouth at bedtime as needed for anxiety.   Yes [provider]  Cholecalciferol (VITAMIN D3) 1.25 MG (50000 UT) CAPS Take 1 capsule by mouth once a week. 09/17/18  Yes [provider]  FLUoxetine (PROZAC) 40 MG capsule Take 40 mg by mouth daily.   Yes [provider]    Allergies  Allergen Reactions  . Tape     rash  . Neomycin Rash    Patient Active Problem List   Diagnosis Date Noted  . S/P gastric bypass 12/18/2014  . 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 Pure hypercholesterolemia 12/15/2013  . Vitamin D deficiency 12/15/2013  . Morbid obesity ([place]) 03/21/2011    Past Medical History:  Diagnosis Date  . Hx of laparoscopic gastric banding   . Hyperlipemia   . Hypertension    was taken of HTN meds by PCP "years ago"  . Morbid obesity with BMI of 45.0-49.9, adult ([place])   . OCD (obsessive compulsive disorder) 04/2016  . Varicose veins     Past Surgical History:  Procedure Laterality Date  . CESAREAN SECTION N/A 01/18/2016   Procedure: CESAREAN SECTION;  Surgeon: Kendra Graff, MD;  Location: [place];  Service: Obstetrics;  Laterality: N/A;  . COLONOSCOPY WITH PROPOFOL N/A 11/24/2014   Procedure: COLONOSCOPY WITH PROPOFOL;  Surgeon: Kendra Banister, MD;  Location: [facility];  Service: Endoscopy;  Laterality: N/A;  . LAPAROSCOPIC GASTRIC BANDING  04/17/2011  . [name] RESECTION     2016  .  WISDOM TOOTH EXTRACTION      Social History   Socioeconomic History  . Marital status: Single    Spouse name: Not on file  . Number of children: Not on file  . Years of education: Master's  . Highest education level: Not on file  Occupational History  . Occupation: Pharmacist, hospital- middle school  Tobacco Use  . Smoking status: Never Smoker  . Smokeless tobacco: Never Used  Substance and Sexual Activity  . Alcohol use: Yes    Comment: occ  . Drug use: No  . Sexual activity: Not on file  Other Topics Concern  . Not on file  Social History Narrative   Lives at home by herself.   Caffeine use: Soda occass Kendra Nguyen)   Engaged   Social Determinants of Health   Financial Resource Strain:   . Difficulty of Paying Living Expenses: Not on file  Food Insecurity:   . Worried About Charity fundraiser in the Last Year: Not on file  . Ran Out of Food in the Last Year: Not on file  Transportation Needs:   . Lack of Transportation (Medical): Not on file  . Lack of Transportation (Non-Medical): Not on file  Physical Activity:   . Days of Exercise per Week: Not on file  . Minutes of Exercise per Session: Not on file  Stress:   . Feeling of Stress : Not on file  Social Connections:   . Frequency of Communication with Friends and Family: Not on file  . Frequency of Social Gatherings with Friends and Family: Not on file  . Attends Religious Services: Not on file  . Active Member of Clubs or Organizations: Not on file  . Attends Archivist Meetings: Not on file  . Marital Status: Not on file  Intimate Partner Violence:   . Fear of Current or Ex-Partner: Not on file  . Emotionally Abused: Not on file  . Physically Abused: Not on file  . Sexually Abused: Not on file    Family History  Problem Relation Age of Onset  . Diabetes Mother   . Heart disease Mother   . Arthritis Father        gout  . COPD Father        smoker  . Hyperlipidemia Father   . Cancer Father        stomach   . Aneurysm Sister 34       cerebral;   . COPD Paternal 18   . Heart disease Maternal Grandmother   . Heart disease Maternal Grandfather   . Neuropathy Neg Hx      Review of Systems  Constitutional: Negative.  Negative for chills and fever.  HENT: Negative.  Negative for congestion and sore throat.   Respiratory: Negative.  Negative for cough and shortness of breath.   Cardiovascular: Negative.  Negative for chest pain and palpitations.  Gastrointestinal: Negative.  Negative for abdominal pain, diarrhea, nausea and vomiting.  Genitourinary: Negative.  Negative for dysuria and hematuria.  Musculoskeletal: Negative.  Negative for back pain, myalgias and neck pain.  Skin: Negative.  Negative for rash.  Neurological: Negative for dizziness and headaches.  All other systems reviewed and are negative.  Today's Vitals   02/11/19 1632  BP: 120/81  Pulse: 76  Resp: 16  Temp: 98.3 F (36.8 C)  TempSrc: Temporal  SpO2: 98%  Weight: (!) 368 lb (166.9 kg)  Height: 5\' 6"  (1.676 m)   Body mass index is 59.4 kg/m.   Physical Exam Vitals reviewed.  Constitutional:      Appearance: She is obese.  HENT:     Head: Normocephalic.  Eyes:     Extraocular Movements: Extraocular movements intact.     Pupils: Pupils are equal, round, and reactive to light.  Cardiovascular:     Rate and Rhythm: Normal rate.  Pulmonary:     Effort: Pulmonary effort is normal.  Musculoskeletal:     Cervical back: Normal range of motion.     Comments: Right foot: Fifth toe: No swelling or ecchymosis.  No significant tenderness.  Healing well.  Skin:    General: Skin is warm and dry.  Neurological:     General: No focal deficit present.     Mental Status: She is alert and oriented to person, place, and time.  Psychiatric:        Mood and Affect: Mood normal.        Behavior: Behavior normal.      ASSESSMENT & PLAN: Kendra Nguyen was seen today for fracture  toe.  Diagnoses and all orders for this  visit:  Closed nondisplaced fracture of phalanx of lesser toe of right foot with routine healing, unspecified phalanx, subsequent encounter  Screening for tuberculosis -     QuantiFERON-TB Gold Plus  Flu vaccine need -     Flu Vaccine QUAD 36+ mos IM    Patient Instructions  If you have lab work done today you will be contacted with your lab results within the next 2 weeks.  If you have not heard from [place] then please contact us. The fastest way to get your results is to register for My Chart.   IF you received an x-ray today, you will receive an invoice from [facility]. Please contact [facility] at [phone] with questions or concerns regarding your invoice.   IF you received labwork today, you will receive an invoice from [facility]. Please contact [facility] at [phone] with questions or concerns regarding your invoice.   Our billing staff will not be able to assist you with questions regarding bills from these companies.  You will be contacted with the lab results as soon as they are available. The fastest way to get your results is to activate your My Chart account. Instructions are located on the last page of this paperwork. If you have not heard from [place] regarding the results in 2 weeks, please contact this office.     Toe Fracture A toe fracture is a break in one of the toe bones (phalanges). This may happen if you:  Drop a heavy object on your toe.  Stub your toe.  Twist your toe.  Exercise the same way too much. What are the signs or symptoms? The main symptoms are swelling and pain in the toe. You may also have:  Bruising.  Stiffness.  Numbness.  A change in the way the toe looks.  Broken bones that poke through the skin.  Blood under the toenail. How is this treated? Treatments may include:  Taping the broken toe to a toe that is next to it (buddy taping).  Wearing a shoe that has a wide, rigid sole to protect the toe  and to limit its movement.  Wearing a cast.  Surgery. This may be needed if the: ? Pieces of broken bone are out of place. ? Bone pokes through the skin.  Physical therapy. Follow these instructions at home: If you have a shoe:  Wear the shoe as told by your doctor. Remove it only as told by your doctor.  Loosen the shoe if your toes tingle, become numb, or turn cold and blue.  Keep the shoe clean and dry. If you have a cast:  Do not put pressure on any part of the cast until it is fully hardened. This may take a few hours.  Do not stick anything inside the cast to scratch your skin.  Check the skin around the cast every day. Tell your doctor about any concerns.  You may put lotion on dry skin around the edges of the cast.  Do not put lotion on the skin under the cast.  Keep the cast clean and dry. Bathing  Do not take baths, swim, or use a hot tub until your doctor says it is okay. Ask your doctor if you can take showers.  If the shoe or cast is not waterproof: ? Do not let it get wet. ? Cover it with a watertight covering when you take a bath or a shower. Activity  Do not use your foot to support your body weight until your doctor says it is okay.  Use crutches as told by your doctor.  Ask your doctor what activities are safe for you during recovery.  Avoid activities as told by your doctor.  Do exercises as told by your doctor or therapist. Driving  Do not drive or use heavy  machinery while taking pain medicine.  Do not drive while wearing a cast on a foot that you use for driving. Managing pain, stiffness, and swelling   Put ice on the injured area if told by your doctor: ? Put ice in a plastic bag. ? Place a towel between your skin and the bag.  If you have a shoe, remove it as told by your doctor.  If you have a cast, place a towel between your cast and the bag. ? Leave the ice on for 20 minutes, 2-3 times per day.  Raise (elevate) the injured  area above the level of your heart while you are sitting or lying down. General instructions  If your toe was taped to a toe that is next to it, follow your doctor's instructions for changing the gauze and tape. Change it more often: ? If the gauze and tape get wet. If this happens, dry the space between the toes. ? If the gauze and tape are too tight and they cause your toe to become pale or to lose feeling (go numb).  If your doctor did not give you a protective shoe, wear sturdy shoes that support your foot. Your shoes should not: ? Pinch your toes. ? Fit tightly against your toes.  Do not use any tobacco products, including cigarettes, chewing tobacco, or e-cigarettes. These can delay bone healing. If you need help quitting, ask your doctor.  Take medicines only as told by your doctor.  Keep all follow-up visits as told by your doctor. This is important. Contact a doctor if:  Your pain medicine is not helping.  You have a fever.  You notice a bad smell coming from your cast. Get help right away if:  You lose feeling (have numbness) in your toe or foot, and it is getting worse.  Your toe or your foot tingles.  Your toe or your foot gets cold or turns blue.  You have redness or swelling in your toe or foot, and it is getting worse.  You have very bad pain. Summary  A toe fracture is a break in one of the toe bones.  Use ice and raise your foot. This will help lessen pain and swelling.  Use crutches as told by your doctor. This information is not intended to replace advice given to you by your health care provider. Make sure you discuss any questions you have with your health care provider. Document Revised: 03/13/2017 Document Reviewed: 02/18/2017 Elsevier Patient Education  2020 Elsevier Inc.      Agustina Caroli, MD [facility]

## 2019-02-14 LAB — QUANTIFERON-TB GOLD PLUS
QuantiFERON Mitogen Value: >10 IU/mL
QuantiFERON Nil Value: 0.03 IU/mL
QuantiFERON TB1 Ag Value: 0.03 IU/mL
QuantiFERON TB2 Ag Value: 0.03 IU/mL
QuantiFERON-TB Gold Plus: NEGATIVE

## 2019-03-21 ENCOUNTER — Ambulatory Visit: Payer: BC Managed Care – PPO | Attending: Internal Medicine

## 2019-03-21 DIAGNOSIS — Z23 Encounter for immunization: Secondary | ICD-10-CM | POA: Insufficient documentation

## 2019-03-21 NOTE — Progress Notes (Signed)
   Covid-19 Vaccination Clinic  Name:  Kendra Nguyen    MRN: [id number] DOB: [birth year]  03/21/2019  Kendra Nguyen was observed post Covid-19 immunization for 15 minutes without incidence. She was provided with Vaccine Information Sheet and instruction to access the V-Safe system.   Kendra Nguyen was instructed to call 911 with any severe reactions post vaccine: Marland Kitchen Difficulty breathing  . Swelling of your face and throat  . A fast heartbeat  . A bad rash all over your body  . Dizziness and weakness    Immunizations Administered    Name Date Dose VIS Date Route   Pfizer COVID-19 Vaccine 03/21/2019  2:39 PM 0.3 mL 01/01/2019 Intramuscular   Manufacturer: [place]   Lot: [id number]   [place]: [id number]

## 2019-04-05 ENCOUNTER — Encounter: Payer: Self-pay | Admitting: Emergency Medicine

## 2019-04-05 ENCOUNTER — Other Ambulatory Visit: Payer: Self-pay

## 2019-04-05 ENCOUNTER — Telehealth (INDEPENDENT_AMBULATORY_CARE_PROVIDER_SITE_OTHER): Payer: BC Managed Care – PPO | Admitting: Emergency Medicine

## 2019-04-05 VITALS — Ht 66.0 in | Wt 353.6 lb

## 2019-04-05 DIAGNOSIS — U071 COVID-19: Secondary | ICD-10-CM | POA: Diagnosis not present

## 2019-04-05 DIAGNOSIS — R05 Cough: Secondary | ICD-10-CM | POA: Diagnosis not present

## 2019-04-05 DIAGNOSIS — R6889 Other general symptoms and signs: Secondary | ICD-10-CM

## 2019-04-05 NOTE — Progress Notes (Signed)
Telemedicine Encounter- SOAP NOTE Established Patient MyChart video conference This video telephone encounter was conducted with the patient's (or proxy's) verbal consent via video audio telecommunications: yes/no: Yes Patient was instructed to have this encounter in a suitably private space; and to only have persons present to whom they give permission to participate. In addition, patient identity was confirmed by use of name plus two identifiers (DOB and address).  I discussed the limitations, risks, security and privacy concerns of performing an evaluation and management service by telephone and the availability of in person appointments. I also discussed with the patient that there may be a patient responsible charge related to this service. The patient expressed understanding and agreed to proceed.  I spent a total of TIME; 0 MIN TO 60 MIN: 15 minutes talking with the patient or their proxy.  Chief Complaint  Patient presents with  . Cough    productive with yellow, brown and green mucus denies fever - per patient started Thursday   . loss of taste    loss of smell and taste with other symptoms - pt was tested [facility] on Saturday at [facility] is a 39 y.o. female established patient. Telephone visit today complaining of flulike symptoms that started 5 days ago. Tested positive for Covid last Saturday.  No complications.  Doing well at home.  Husband and 63-year-old son also displaying symptoms.  They will be tested today.  She thinks 81-year-old son brought it home from daycare center. Doing well.  Denies difficulty breathing or high fevers.  Able to eat and drink well.  HPI   Patient Active Problem List   Diagnosis Date Noted  . S/P gastric bypass 12/18/2014  . 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 Pure hypercholesterolemia 12/15/2013  . Vitamin D deficiency 12/15/2013  . Morbid obesity ([place]) 03/21/2011    Past  Medical History:  Diagnosis Date  . Hx of laparoscopic gastric banding   . Hyperlipemia   . Hypertension    was taken of HTN meds by PCP "years ago"  . Morbid obesity with BMI of 45.0-49.9, adult ([place])   . OCD (obsessive compulsive disorder) 04/2016  . Varicose veins     Current Outpatient Medications  Medication Sig Dispense Refill  . ALPRAZolam (XANAX) 0.5 MG tablet Take 0.5 mg by mouth at bedtime as needed for anxiety.    . Cholecalciferol (VITAMIN D3) 1.25 MG (50000 UT) CAPS Take 1 capsule by mouth once a week.    Marland Kitchen FLUoxetine (PROZAC) 40 MG capsule Take 40 mg by mouth daily.     No current facility-administered medications for this visit.    Allergies  Allergen Reactions  . Tape     rash  . Neomycin Rash    Social History   Socioeconomic History  . Marital status: Married    Spouse name: Not on file  . Number of children: Not on file  . Years of education: Master's  . Highest education level: Not on file  Occupational History  . Occupation: Pharmacist, hospital- middle school  Tobacco Use  . Smoking status: Never Smoker  . Smokeless tobacco: Never Used  Substance and Sexual Activity  . Alcohol use: Yes    Comment: occ  . Drug use: No  . Sexual activity: Not on file  Other Topics Concern  . Not on file  Social History Narrative   Lives at home by herself.   Caffeine use:  Soda occass Kendra Nguyen)   Engaged   Social Determinants of Health   Financial Resource Strain:   . Difficulty of Paying Living Expenses:   Food Insecurity:   . Worried About Charity fundraiser in the Last Year:   . Arboriculturist in the Last Year:   Transportation Needs:   . Film/video editor (Medical):   Marland Kitchen Lack of Transportation (Non-Medical):   Physical Activity:   . Days of Exercise per Week:   . Minutes of Exercise per Session:   Stress:   . Feeling of Stress :   Social Connections:   . Frequency of Communication with Friends and Family:   . Frequency of Social Gatherings with Friends  and Family:   . Attends Religious Services:   . Active Member of Clubs or Organizations:   . Attends Archivist Meetings:   Marland Kitchen Marital Status:   Intimate Partner Violence:   . Fear of Current or Ex-Partner:   . Emotionally Abused:   Marland Kitchen Physically Abused:   . Sexually Abused:     Review of Systems  Constitutional: Positive for chills, fever and malaise/fatigue.  HENT: Positive for congestion, ear pain, sinus pain and sore throat.   Respiratory: Positive for cough. Negative for hemoptysis, shortness of breath and wheezing.   Cardiovascular: Negative.  Negative for chest pain and palpitations.  Gastrointestinal: Negative.  Negative for abdominal pain, blood in stool, diarrhea, melena, nausea and vomiting.  Genitourinary: Negative.  Negative for dysuria and hematuria.  Musculoskeletal: Positive for myalgias.  Skin: Negative.   Neurological: Negative.  Negative for dizziness and headaches.  Endo/Heme/Allergies: Negative.   All other systems reviewed and are negative.   Objective  Alert and oriented x3 in no apparent respiratory distress. Vitals as reported by the patient: Today's Vitals   04/05/19 0948  Weight: (!) 353 lb 9.6 oz (160.4 kg)  Height: 5\' 6"  (1.676 m)    There are no diagnoses linked to this encounter. [name] was seen today for cough and loss of taste.  Diagnoses and all orders for this visit:  COVID-19 virus infection  Flu-like symptoms  Clinically stable.  No red flag signs or symptoms.  No complications.  ED precautions given. Covid instructions given. Advised to contact the office if no better or worse during the next several days.   I discussed the assessment and treatment plan with the patient. The patient was provided an opportunity to ask questions and all were answered. The patient agreed with the plan and demonstrated an understanding of the instructions.   The patient was advised to call back or seek an in-person evaluation if the  symptoms worsen or if the condition fails to improve as anticipated.  I provided 15 minutes of non-face-to-face time during this encounter.  Horald Pollen, MD  Primary Care at [facility]

## 2019-04-12 ENCOUNTER — Telehealth (INDEPENDENT_AMBULATORY_CARE_PROVIDER_SITE_OTHER): Payer: BC Managed Care – PPO | Admitting: Emergency Medicine

## 2019-04-12 ENCOUNTER — Encounter: Payer: Self-pay | Admitting: Emergency Medicine

## 2019-04-12 ENCOUNTER — Other Ambulatory Visit: Payer: Self-pay

## 2019-04-12 VITALS — Ht 66.0 in | Wt 354.8 lb

## 2019-04-12 DIAGNOSIS — U071 COVID-19: Secondary | ICD-10-CM | POA: Diagnosis not present

## 2019-04-12 NOTE — Patient Instructions (Signed)
° ° ° °  If you have lab work done today you will be contacted with your lab results within the next 2 weeks.  If you have not heard from us then please contact us. The fastest way to get your results is to register for My Chart. ° ° °IF you received an x-ray today, you will receive an invoice from [facility]. Please contact [facility] at [phone] with questions or concerns regarding your invoice.  ° °IF you received labwork today, you will receive an invoice from [facility]. Please contact [facility] at [phone] with questions or concerns regarding your invoice.  ° °Our billing staff will not be able to assist you with questions regarding bills from these companies. ° °You will be contacted with the lab results as soon as they are available. The fastest way to get your results is to activate your My Chart account. Instructions are located on the last page of this paperwork. If you have not heard from us regarding the results in 2 weeks, please contact this office. °  ° ° ° °

## 2019-04-12 NOTE — Progress Notes (Signed)
Telemedicine Encounter- SOAP NOTE Established Patient MyChart video encounter  this video telephone encounter was conducted with the patient's (or proxy's) verbal consent via video audio telecommunications: yes/no: Yes Patient was instructed to have this encounter in a suitably private space; and to only have persons present to whom they give permission to participate. In addition, patient identity was confirmed by use of name plus two identifiers (DOB and address).  I discussed the limitations, risks, security and privacy concerns of performing an evaluation and management service by telephone and the availability of in person appointments. I also discussed with the patient that there may be a patient responsible charge related to this service. The patient expressed understanding and agreed to proceed.  I spent a total of TIME; 0 MIN TO 60 MIN: 15 minutes talking with the patient or their proxy.  Chief Complaint  Patient presents with  . covid 19 dx on 04/03/19  . Nasal Congestion    more congestion and feels like somthing on throat. going on the last couple of nights    Subjective   Kendra Nguyen is a 39 y.o. female established patient. Telephone visit today for follow-up of Covid infection.  Doing well without complications but still has chest congestion with mucus that is hard to bring up.  No other complaints or medical concerns today.  HPI   Patient Active Problem List   Diagnosis Date Noted  . COVID-19 virus infection 04/05/2019  . S/P gastric bypass 12/18/2014  . 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 Pure hypercholesterolemia 12/15/2013  . Vitamin D deficiency 12/15/2013  . Morbid obesity ([place]) 03/21/2011    Past Medical History:  Diagnosis Date  . Hx of laparoscopic gastric banding   . Hyperlipemia   . Hypertension    was taken of HTN meds by PCP "years ago"  . Morbid obesity with BMI of 45.0-49.9, adult ([place])   . OCD (obsessive  compulsive disorder) 04/2016  . Varicose veins     Current Outpatient Medications  Medication Sig Dispense Refill  . ALPRAZolam (XANAX) 0.5 MG tablet Take 0.5 mg by mouth at bedtime as needed for anxiety.    Marland Kitchen FLUoxetine (PROZAC) 20 MG tablet Take 60 mg by mouth daily.    Marland Kitchen FLUoxetine (PROZAC) 40 MG capsule Take 40 mg by mouth daily.    . fluticasone (FLONASE) 50 MCG/ACT nasal spray Place into both nostrils daily.    Marland Kitchen loratadine (CLARITIN) 10 MG tablet Take 10 mg by mouth daily.    . metFORMIN (GLUCOPHAGE-XR) 500 MG 24 hr tablet Take 500 mg by mouth daily.    . Pseudoephedrine-guaiFENesin (MUCINEX D PO) Take by mouth.    . Cholecalciferol (VITAMIN D3) 1.25 MG (50000 UT) CAPS Take 1 capsule by mouth once a week.     No current facility-administered medications for this visit.    Allergies  Allergen Reactions  . Tape     rash  . Neomycin Rash    Social History   Socioeconomic History  . Marital status: Married    Spouse name: Not on file  . Number of children: Not on file  . Years of education: Master's  . Highest education level: Not on file  Occupational History  . Occupation: Pharmacist, hospital- middle school  Tobacco Use  . Smoking status: Never Smoker  . Smokeless tobacco: Never Used  Substance and Sexual Activity  . Alcohol use: Yes    Comment: occ  . Drug use: No  . Sexual activity:  Not on file  Other Topics Concern  . Not on file  Social History Narrative   Lives at home by herself.   Caffeine use: Soda occass Kendra Nguyen)   Engaged   Social Determinants of Health   Financial Resource Strain:   . Difficulty of Paying Living Expenses:   Food Insecurity:   . Worried About Charity fundraiser in the Last Year:   . Arboriculturist in the Last Year:   Transportation Needs:   . Film/video editor (Medical):   Marland Kitchen Lack of Transportation (Non-Medical):   Physical Activity:   . Days of Exercise per Week:   . Minutes of Exercise per Session:   Stress:   . Feeling of  Stress :   Social Connections:   . Frequency of Communication with Friends and Family:   . Frequency of Social Gatherings with Friends and Family:   . Attends Religious Services:   . Active Member of Clubs or Organizations:   . Attends Archivist Meetings:   Marland Kitchen Marital Status:   Intimate Partner Violence:   . Fear of Current or Ex-Partner:   . Emotionally Abused:   Marland Kitchen Physically Abused:   . Sexually Abused:     Review of Systems  Constitutional: Negative.  Negative for chills and fever.  HENT: Positive for congestion.   Respiratory: Positive for cough. Negative for hemoptysis, sputum production, shortness of breath and wheezing.   Cardiovascular: Negative.  Negative for chest pain and palpitations.  Gastrointestinal: Negative.  Negative for abdominal pain, blood in stool, diarrhea, melena, nausea and vomiting.  Genitourinary: Negative.  Negative for hematuria.  Musculoskeletal: Negative for myalgias.  Skin: Negative.  Negative for rash.  Neurological: Negative.  Negative for dizziness and headaches.  Endo/Heme/Allergies: Negative.   All other systems reviewed and are negative.   Objective  Alert and oriented x3 in no apparent respiratory distress. Vitals as reported by the patient: Today's Vitals   04/12/19 1300  Weight: (!) 354 lb 12.8 oz (160.9 kg)  Height: 5\' 6"  (1.676 m)    There are no diagnoses linked to this encounter. Kendra Nguyen was seen today for covid 19 dx on 04/03/19 and nasal congestion.  Diagnoses and all orders for this visit:  COVID-19 virus infection   Clinically stable.  No red flag signs or symptoms.  Progressing well and as expected. Work note provided. Advised to contact the office if no better or worse during the next several days.  I discussed the assessment and treatment plan with the patient. The patient was provided an opportunity to ask questions and all were answered. The patient agreed with the plan and demonstrated an understanding  of the instructions.   The patient was advised to call back or seek an in-person evaluation if the symptoms worsen or if the condition fails to improve as anticipated.  I provided 15 minutes of non-face-to-face time during this encounter.  Horald Pollen, MD  Primary Care at [facility]

## 2019-04-13 ENCOUNTER — Ambulatory Visit: Payer: BC Managed Care – PPO | Attending: Internal Medicine

## 2019-04-13 DIAGNOSIS — Z23 Encounter for immunization: Secondary | ICD-10-CM

## 2019-04-13 NOTE — Progress Notes (Signed)
   Covid-19 Vaccination Clinic  Name:  Kendra Nguyen    MRN: [id number] DOB: 01/17/1981  04/13/2019  [name] was observed post Covid-19 immunization for 15 minutes without incident. She was provided with Vaccine Information Sheet and instruction to access the V-Safe system.   [name] was instructed to call 911 with any severe reactions post vaccine: Marland Kitchen Difficulty breathing  . Swelling of face and throat  . A fast heartbeat  . A bad rash all over body  . Dizziness and weakness   Immunizations Administered    Name Date Dose VIS Date Route   Pfizer COVID-19 Vaccine 04/13/2019  9:47 AM 0.3 mL 01/01/2019 Intramuscular   Manufacturer: [place]   Lot: [id number]   [place]: [id number]

## 2019-04-14 ENCOUNTER — Telehealth (INDEPENDENT_AMBULATORY_CARE_PROVIDER_SITE_OTHER): Payer: BC Managed Care – PPO | Admitting: Emergency Medicine

## 2019-04-14 ENCOUNTER — Other Ambulatory Visit: Payer: Self-pay

## 2019-04-14 DIAGNOSIS — U071 COVID-19: Secondary | ICD-10-CM

## 2019-04-14 NOTE — Progress Notes (Signed)
Pt has Covid 19.  Took the second vaccine yesterday.  Pain under the stomach. cradle in a feteal position.   Hurts to cough.    This started yesterday, with loose stool. Urine is a very light yellow.   4:50 PM.  Tried to connect with the patient but no answer.

## 2019-04-15 ENCOUNTER — Encounter: Payer: Self-pay | Admitting: Emergency Medicine

## 2019-05-10 ENCOUNTER — Telehealth (INDEPENDENT_AMBULATORY_CARE_PROVIDER_SITE_OTHER): Payer: BC Managed Care – PPO | Admitting: Emergency Medicine

## 2019-05-10 ENCOUNTER — Encounter: Payer: Self-pay | Admitting: Emergency Medicine

## 2019-05-10 ENCOUNTER — Other Ambulatory Visit: Payer: Self-pay

## 2019-05-10 VITALS — Ht 66.0 in | Wt 355.0 lb

## 2019-05-10 DIAGNOSIS — A09 Infectious gastroenteritis and colitis, unspecified: Secondary | ICD-10-CM

## 2019-05-10 DIAGNOSIS — K529 Noninfective gastroenteritis and colitis, unspecified: Secondary | ICD-10-CM | POA: Diagnosis not present

## 2019-05-10 DIAGNOSIS — R112 Nausea with vomiting, unspecified: Secondary | ICD-10-CM | POA: Diagnosis not present

## 2019-05-10 NOTE — Patient Instructions (Signed)
° ° ° °  If you have lab work done today you will be contacted with your lab results within the next 2 weeks.  If you have not heard from us then please contact us. The fastest way to get your results is to register for My Chart. ° ° °IF you received an x-ray today, you will receive an invoice from [facility]. Please contact [facility] at [phone] with questions or concerns regarding your invoice.  ° °IF you received labwork today, you will receive an invoice from [facility]. Please contact [facility] at [phone] with questions or concerns regarding your invoice.  ° °Our billing staff will not be able to assist you with questions regarding bills from these companies. ° °You will be contacted with the lab results as soon as they are available. The fastest way to get your results is to activate your My Chart account. Instructions are located on the last page of this paperwork. If you have not heard from us regarding the results in 2 weeks, please contact this office. °  ° ° ° °

## 2019-05-10 NOTE — Progress Notes (Signed)
Telemedicine Encounter- SOAP NOTE Established Patient MyChart video conference This video telephone encounter was conducted with the patient's (or proxy's) verbal consent video via audio telecommunications: yes/no: Yes Patient was instructed to have this encounter in a suitably private space; and to only have persons present to whom they give permission to participate. In addition, patient identity was confirmed by use of name plus two identifiers (DOB and address).  I discussed the limitations, risks, security and privacy concerns of performing an evaluation and management service by telephone and the availability of in person appointments. I also discussed with the patient that there may be a patient responsible charge related to this service. The patient expressed understanding and agreed to proceed.  I spent a total of TIME; 0 MIN TO 60 MIN: 15 minutes talking with the patient or their proxy.  Chief Complaint  Patient presents with  . Diarrhea    per patient started yesterday afternoon with nausea/vomiting  ---CALL PATIENT'S PHONE  . Headache    stomach cramping, per pt son's daycare has stomach virus there    Subjective   Kendra Nguyen is a 39 y.o. female established patient. Telephone visit today complaining of nausea vomiting, abdominal cramping and watery diarrhea that started 24 hours ago.  69-year-old son with similar symptoms.  Feeling better and tolerating liquids without vomiting. Denies hematemesis or rectal bleeding.  No fever or chills.  No difficulty breathing.  No other significant symptoms.  Husband also sick with similar symptoms.  HPI   Patient Active Problem List   Diagnosis Date Noted  . COVID-19 virus infection 04/05/2019  . S/P gastric bypass 12/18/2014  . 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 Pure hypercholesterolemia 12/15/2013  . Vitamin D deficiency 12/15/2013  . Morbid obesity ([place]) 03/21/2011    Past Medical History:    Diagnosis Date  . Hx of laparoscopic gastric banding   . Hyperlipemia   . Hypertension    was taken of HTN meds by PCP "years ago"  . Morbid obesity with BMI of 45.0-49.9, adult ([place])   . OCD (obsessive compulsive disorder) 04/2016  . Varicose veins     Current Outpatient Medications  Medication Sig Dispense Refill  . ALPRAZolam (XANAX) 0.5 MG tablet Take 0.5 mg by mouth at bedtime as needed for anxiety.    Marland Kitchen FLUoxetine (PROZAC) 20 MG tablet Take 60 mg by mouth daily.    Marland Kitchen FLUoxetine (PROZAC) 40 MG capsule Take 40 mg by mouth daily.    Marland Kitchen loratadine (CLARITIN) 10 MG tablet Take 10 mg by mouth daily.    . metFORMIN (GLUCOPHAGE-XR) 500 MG 24 hr tablet Take 500 mg by mouth daily.    . Cholecalciferol (VITAMIN D3) 1.25 MG (50000 UT) CAPS Take 1 capsule by mouth once a week.    . fluticasone (FLONASE) 50 MCG/ACT nasal spray Place into both nostrils daily.     No current facility-administered medications for this visit.    Allergies  Allergen Reactions  . Tape     rash  . Neomycin Rash    Social History   Socioeconomic History  . Marital status: Married    Spouse name: Not on file  . Number of children: Not on file  . Years of education: Master's  . Highest education level: Not on file  Occupational History  . Occupation: Pharmacist, hospital- middle school  Tobacco Use  . Smoking status: Never Smoker  . Smokeless tobacco: Never Used  Substance and Sexual Activity  . Alcohol  use: Yes    Comment: occ  . Drug use: No  . Sexual activity: Not on file  Other Topics Concern  . Not on file  Social History Narrative   Lives at home by herself.   Caffeine use: Soda occass [name])   Engaged   Social Determinants of Health   Financial Resource Strain:   . Difficulty of Paying Living Expenses:   Food Insecurity:   . Worried About Charity fundraiser in the Last Year:   . Arboriculturist in the Last Year:   Transportation Needs:   . Film/video editor (Medical):   Marland Kitchen Lack of  Transportation (Non-Medical):   Physical Activity:   . Days of Exercise per Week:   . Minutes of Exercise per Session:   Stress:   . Feeling of Stress :   Social Connections:   . Frequency of Communication with Friends and Family:   . Frequency of Social Gatherings with Friends and Family:   . Attends Religious Services:   . Active Member of Clubs or Organizations:   . Attends Archivist Meetings:   Marland Kitchen Marital Status:   Intimate Partner Violence:   . Fear of Current or Ex-Partner:   . Emotionally Abused:   Marland Kitchen Physically Abused:   . Sexually Abused:     Review of Systems  Constitutional: Negative.  Negative for chills and fever.  HENT: Negative.  Negative for congestion and sore throat.   Respiratory: Negative.  Negative for cough and shortness of breath.   Cardiovascular: Negative.  Negative for chest pain and palpitations.  Gastrointestinal: Positive for abdominal pain, diarrhea, nausea and vomiting. Negative for blood in stool, heartburn and melena.  Genitourinary: Negative.  Negative for dysuria and hematuria.  Musculoskeletal: Negative.  Negative for back pain, myalgias and neck pain.  Skin: Negative.  Negative for rash.  Neurological: Negative for dizziness and headaches.  All other systems reviewed and are negative.   Objective  Alert and oriented x3 no apparent respiratory distress. Vitals as reported by the patient: Today's Vitals   05/10/19 1551  Weight: (!) 355 lb (161 kg)  Height: 5\' 6"  (1.676 m)    There are no diagnoses linked to this encounter. [name] was seen today for diarrhea and headache.  Diagnoses and all orders for this visit:  Acute gastroenteritis  Non-intractable vomiting with nausea, unspecified vomiting type  Diarrhea of infectious origin  Clinically stable.  No red flag signs or symptoms.  Running its course.  Tolerating oral liquids well. Clear liquid diet and advance to brat diet as tolerated.  Advised to contact the office  if no better or worse during the next several days.   I discussed the assessment and treatment plan with the patient. The patient was provided an opportunity to ask questions and all were answered. The patient agreed with the plan and demonstrated an understanding of the instructions.   The patient was advised to call back or seek an in-person evaluation if the symptoms worsen or if the condition fails to improve as anticipated.  I provided 15 minutes of non-face-to-face time during this encounter.  Horald Pollen, MD  Primary Care at [facility]

## 2019-08-24 ENCOUNTER — Ambulatory Visit: Payer: BC Managed Care – PPO | Admitting: Emergency Medicine

## 2019-08-25 ENCOUNTER — Encounter: Payer: Self-pay | Admitting: Emergency Medicine

## 2019-10-26 ENCOUNTER — Ambulatory Visit: Payer: BC Managed Care – PPO | Admitting: Emergency Medicine

## 2019-10-27 ENCOUNTER — Encounter: Payer: Self-pay | Admitting: Emergency Medicine

## 2019-10-28 ENCOUNTER — Ambulatory Visit (INDEPENDENT_AMBULATORY_CARE_PROVIDER_SITE_OTHER): Payer: BC Managed Care – PPO | Admitting: Emergency Medicine

## 2019-10-28 ENCOUNTER — Other Ambulatory Visit: Payer: Self-pay

## 2019-10-28 ENCOUNTER — Encounter: Payer: Self-pay | Admitting: Emergency Medicine

## 2019-10-28 VITALS — BP 119/79 | HR 70 | Temp 97.6°F | Resp 16 | Ht 66.0 in | Wt 361.0 lb

## 2019-10-28 DIAGNOSIS — R21 Rash and other nonspecific skin eruption: Secondary | ICD-10-CM

## 2019-10-28 DIAGNOSIS — Z23 Encounter for immunization: Secondary | ICD-10-CM | POA: Diagnosis not present

## 2019-10-28 DIAGNOSIS — L239 Allergic contact dermatitis, unspecified cause: Secondary | ICD-10-CM | POA: Diagnosis not present

## 2019-10-28 DIAGNOSIS — Z6841 Body Mass Index (BMI) 40.0 and over, adult: Secondary | ICD-10-CM | POA: Diagnosis not present

## 2019-10-28 MED ORDER — PREDNISONE 20 MG PO TABS: 40.0000 mg | ORAL_TABLET | Freq: Every day | ORAL | 0 refills | Status: AC

## 2019-10-28 NOTE — Patient Instructions (Addendum)
If you have lab work done today you will be contacted with your lab results within the next 2 weeks.  If you have not heard from [place] then please contact us. The fastest way to get your results is to register for My Chart.   IF you received an x-ray today, you will receive an invoice from [facility]. Please contact [facility] at [phone] with questions or concerns regarding your invoice.   IF you received labwork today, you will receive an invoice from [facility] [place] [facility]. Please contact [facility] at [phone] with questions or concerns regarding your invoice.   Our billing staff will not be able to assist you with questions regarding bills from these companies.  You will be contacted with the lab results as soon as they are available. The fastest way to get your results is to activate your My Chart account. Instructions are located on the last page of this paperwork. If you have not heard from [place] regarding the results in 2 weeks, please contact this office.     Rash, Adult  A rash is a change in the color of your skin. A rash can also change the way your skin feels. There are many different conditions and factors that can cause a rash. Follow these instructions at home: The goal of treatment is to stop the itching and keep the rash from spreading. Watch for any changes in your symptoms. Let your doctor know about them. Follow these instructions to help with your condition: Medicine Take or apply over-the-counter and prescription medicines only as told by your doctor. These may include medicines:  To treat red or swollen skin (corticosteroid creams).  To treat itching.  To treat an allergy (oral antihistamines).  To treat very bad symptoms (oral corticosteroids).  Skin care  Put cool cloths (compresses) on the affected areas.  Do not scratch or rub your skin.  Avoid covering the rash. Make sure that the rash is exposed to air as much as possible. Managing  itching and discomfort  Avoid hot showers or baths. These can make itching worse. A cold shower may help.  Try taking a bath with: ? Epsom salts. You can get these at your local pharmacy or grocery store. Follow the instructions on the package. ? Baking soda. Pour a small amount into the bath as told by your doctor. ? Colloidal oatmeal. You can get this at your local pharmacy or grocery store. Follow the instructions on the package.  Try putting baking soda paste onto your skin. Stir water into baking soda until it gets like a paste.  Try putting on a lotion that relieves itchiness (calamine lotion).  Keep cool and out of the sun. Sweating and being hot can make itching worse. General instructions   Rest as needed.  Drink enough fluid to keep your pee (urine) pale yellow.  Wear loose-fitting clothing.  Avoid scented soaps, detergents, and perfumes. Use gentle soaps, detergents, perfumes, and other cosmetic products.  Avoid anything that causes your rash. Keep a journal to help track what causes your rash. Write down: ? What you eat. ? What cosmetic products you use. ? What you drink. ? What you wear. This includes jewelry.  Keep all follow-up visits as told by your doctor. This is important. Contact a doctor if:  You sweat at night.  You lose weight.  You pee (urinate) more than normal.  You pee less than normal, or you notice that your pee is a darker color than  normal.  You feel weak.  You throw up (vomit).  Your skin or the whites of your eyes look yellow (jaundice).  Your skin: ? Tingles. ? Is numb.  Your rash: ? Does not go away after a few days. ? Gets worse.  You are: ? More thirsty than normal. ? More tired than normal.  You have: ? New symptoms. ? Pain in your belly (abdomen). ? A fever. ? Watery poop (diarrhea). Get help right away if:  You have a fever and your symptoms suddenly get worse.  You start to feel mixed up (confused).  You  have a very bad headache or a stiff neck.  You have very bad joint pains or stiffness.  You have jerky movements that you cannot control (seizure).  Your rash covers all or most of your body. The rash may or may not be painful.  You have blisters that: ? Are on top of the rash. ? Grow larger. ? Grow together. ? Are painful. ? Are inside your nose or mouth.  You have a rash that: ? Looks like purple pinprick-sized spots all over your body. ? Has a "bull's eye" or looks like a target. ? Is red and painful, causes your skin to peel, and is not from being in the sun too long. Summary  A rash is a change in the color of your skin. A rash can also change the way your skin feels.  The goal of treatment is to stop the itching and keep the rash from spreading.  Take or apply over-the-counter and prescription medicines only as told by your doctor.  Contact a doctor if you have new symptoms or symptoms that get worse.  Keep all follow-up visits as told by your doctor. This is important. This information is not intended to replace advice given to you by your health care provider. Make sure you discuss any questions you have with your health care provider. Document Revised: 05/01/2018 Document Reviewed: 08/11/2017 Elsevier Patient Education  [place].

## 2019-10-28 NOTE — Progress Notes (Signed)
Kendra Nguyen 39 y.o.   Chief Complaint  Patient presents with  . Rash    HISTORY OF PRESENT ILLNESS: This is a 39 y.o. female complaining of itchy rash intermittent for the past 2 weeks.  Unknown trigger. No associated symptoms. No new medications. No over-the-counter medications. Fully vaccinated against Covid.  HPI   Prior to Admission medications   Medication Sig Start Date End Date Taking? Authorizing Provider  ALPRAZolam Duanne Moron) 0.5 MG tablet Take 0.5 mg by mouth at bedtime as needed for anxiety.    [provider]  Cholecalciferol (VITAMIN D3) 1.25 MG (50000 UT) CAPS Take 1 capsule by mouth once a week. 09/17/18   [provider]  FLUoxetine (PROZAC) 20 MG tablet Take 60 mg by mouth daily. 01/27/19   [provider]  FLUoxetine (PROZAC) 40 MG capsule Take 40 mg by mouth daily.    [provider]  fluticasone (FLONASE) 50 MCG/ACT nasal spray Place into both nostrils daily.    [provider]  loratadine (CLARITIN) 10 MG tablet Take 10 mg by mouth daily.    [provider]  metFORMIN (GLUCOPHAGE-XR) 500 MG 24 hr tablet Take 500 mg by mouth daily. 03/26/19   [provider]    Allergies  Allergen Reactions  . Tape     rash  . Neomycin Rash    Patient Active Problem List   Diagnosis Date Noted  . COVID-19 virus infection 04/05/2019  . S/P gastric bypass 12/18/2014  . 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 Pure hypercholesterolemia 12/15/2013  . Vitamin D deficiency 12/15/2013  . Morbid obesity ([place]) 03/21/2011    Past Medical History:  Diagnosis Date  . Hx of laparoscopic gastric banding   . Hyperlipemia   . Hypertension    was taken of HTN meds by PCP "years ago"  . Morbid obesity with BMI of 45.0-49.9, adult ([place])   . OCD (obsessive compulsive disorder) 04/2016  . Varicose veins     Past Surgical History:  Procedure Laterality Date  . CESAREAN SECTION N/A 01/18/2016    Procedure: CESAREAN SECTION;  Surgeon: Everett Graff, MD;  Location: [facility];  Service: Obstetrics;  Laterality: N/A;  . COLONOSCOPY WITH PROPOFOL N/A 11/24/2014   Procedure: COLONOSCOPY WITH PROPOFOL;  Surgeon: Milus Banister, MD;  Location: [facility];  Service: Endoscopy;  Laterality: N/A;  . LAPAROSCOPIC GASTRIC BANDING  04/17/2011  . Agency RESECTION     2016  . WISDOM TOOTH EXTRACTION      Social History   Socioeconomic History  . Marital status: Married    Spouse name: Not on file  . Number of children: Not on file  . Years of education: Master's  . Highest education level: Not on file  Occupational History  . Occupation: Pharmacist, hospital- middle school  Tobacco Use  . Smoking status: Never Smoker  . Smokeless tobacco: Never Used  Vaping Use  . Vaping Use: Never used  Substance and Sexual Activity  . Alcohol use: Yes    Comment: occ  . Drug use: No  . Sexual activity: Not on file  Other Topics Concern  . Not on file  Social History Narrative   Lives at home by herself.   Caffeine use: Soda occass [name])   Engaged   Social Determinants of Health   Financial Resource Strain:   . Difficulty of Paying Living Expenses: Not on file  Food Insecurity:   . Worried About Charity fundraiser in  the Last Year: Not on file  . Ran Out of Food in the Last Year: Not on file  Transportation Needs:   . Lack of Transportation (Medical): Not on file  . Lack of Transportation (Non-Medical): Not on file  Physical Activity:   . Days of Exercise per Week: Not on file  . Minutes of Exercise per Session: Not on file  Stress:   . Feeling of Stress : Not on file  Social Connections:   . Frequency of Communication with Friends and Family: Not on file  . Frequency of Social Gatherings with Friends and Family: Not on file  . Attends Religious Services: Not on file  . Active Member of Clubs or Organizations: Not on file  . Attends Archivist Meetings:  Not on file  . Marital Status: Not on file  Intimate Partner Violence:   . Fear of Current or Ex-Partner: Not on file  . Emotionally Abused: Not on file  . Physically Abused: Not on file  . Sexually Abused: Not on file    Family History  Problem Relation Age of Onset  . Diabetes Mother   . Heart disease Mother   . Arthritis Father        gout  . COPD Father        smoker  . Hyperlipidemia Father   . Cancer Father        stomach  . Aneurysm Sister 34       cerebral;   . COPD Paternal 34   . Heart disease Maternal Grandmother   . Heart disease Maternal Grandfather   . Neuropathy Neg Hx      Review of Systems  Constitutional: Negative.  Negative for chills and fever.  HENT: Negative.  Negative for congestion and sore throat.   Respiratory: Negative.  Negative for cough and shortness of breath.   Cardiovascular: Negative.  Negative for chest pain and palpitations.  Gastrointestinal: Negative.  Negative for abdominal pain, diarrhea, nausea and vomiting.  Genitourinary: Negative.  Negative for hematuria.  Musculoskeletal: Negative.  Negative for back pain, joint pain, myalgias and neck pain.  Skin: Positive for itching and rash.  Neurological: Negative.  Negative for dizziness and headaches.  All other systems reviewed and are negative.  Today's Vitals   10/28/19 1505  BP: 119/79  Pulse: 70  Resp: 16  Temp: 97.6 F (36.4 C)  TempSrc: Temporal  SpO2: 98%  Weight: (!) 361 lb (163.7 kg)  Height: 5\' 6"  (1.676 m)   Body mass index is 58.27 kg/m.   Physical Exam Vitals reviewed.  Constitutional:      Appearance: She is obese.  HENT:     Head: Normocephalic.  Eyes:     Extraocular Movements: Extraocular movements intact.     Pupils: Pupils are equal, round, and reactive to light.  Cardiovascular:     Rate and Rhythm: Normal rate.     Heart sounds: Normal heart sounds.  Pulmonary:     Effort: Pulmonary effort is normal.     Breath sounds: Normal breath  sounds.  Musculoskeletal:     Cervical back: Normal range of motion.  Skin:    General: Skin is warm and dry.     Capillary Refill: Capillary refill takes less than 2 seconds.     Findings: Rash (Mild maculopapular rash to trunk and upper extremities) present.  Neurological:     General: No focal deficit present.     Mental Status: She is alert and oriented to  person, place, and time.  Psychiatric:        Mood and Affect: Mood normal.        Behavior: Behavior normal.    A total of 30 minutes was spent with the patient, greater than 50% of which was in counseling/coordination of care regarding differential diagnosis of rash many different possible triggers, treatment including medications, morbid obesity, education regarding diet and nutrition, review of most recent office visit notes, review of most recent blood work results, documentation, prognosis and need for follow-up.   ASSESSMENT & PLAN: Lindamarie was seen today for rash.  Diagnoses and all orders for this visit:  Rash and nonspecific skin eruption -     predniSONE (DELTASONE) 20 MG tablet; Take 2 tablets (40 mg total) by mouth daily with breakfast for 5 days.  Allergic dermatitis -     predniSONE (DELTASONE) 20 MG tablet; Take 2 tablets (40 mg total) by mouth daily with breakfast for 5 days.  Morbid obesity ([place])  Body mass index (BMI) of 50-59.9 in adult [facility])    Patient Instructions       If you have lab work done today you will be contacted with your lab results within the next 2 weeks.  If you have not heard from [place] then please contact us. The fastest way to get your results is to register for My Chart.   IF you received an x-ray today, you will receive an invoice from [facility]. Please contact [facility] at [phone] with questions or concerns regarding your invoice.   IF you received labwork today, you will receive an invoice from [facility]. Please contact [facility] at [phone] with  questions or concerns regarding your invoice.   Our billing staff will not be able to assist you with questions regarding bills from these companies.  You will be contacted with the lab results as soon as they are available. The fastest way to get your results is to activate your My Chart account. Instructions are located on the last page of this paperwork. If you have not heard from [place] regarding the results in 2 weeks, please contact this office.     Rash, Adult  A rash is a change in the color of your skin. A rash can also change the way your skin feels. There are many different conditions and factors that can cause a rash. Follow these instructions at home: The goal of treatment is to stop the itching and keep the rash from spreading. Watch for any changes in your symptoms. Let your doctor know about them. Follow these instructions to help with your condition: Medicine Take or apply over-the-counter and prescription medicines only as told by your doctor. These may include medicines:  To treat red or swollen skin (corticosteroid creams).  To treat itching.  To treat an allergy (oral antihistamines).  To treat very bad symptoms (oral corticosteroids).  Skin care  Put cool cloths (compresses) on the affected areas.  Do not scratch or rub your skin.  Avoid covering the rash. Make sure that the rash is exposed to air as much as possible. Managing itching and discomfort  Avoid hot showers or baths. These can make itching worse. A cold shower may help.  Try taking a bath with: ? Epsom salts. You can get these at your local pharmacy or grocery store. Follow the instructions on the package. ? Baking soda. Pour a small amount into the bath as told by your doctor. ? Colloidal oatmeal. You can get this  at your local pharmacy or grocery store. Follow the instructions on the package.  Try putting baking soda paste onto your skin. Stir water into baking soda until it gets like a  paste.  Try putting on a lotion that relieves itchiness (calamine lotion).  Keep cool and out of the sun. Sweating and being hot can make itching worse. General instructions   Rest as needed.  Drink enough fluid to keep your pee (urine) pale yellow.  Wear loose-fitting clothing.  Avoid scented soaps, detergents, and perfumes. Use gentle soaps, detergents, perfumes, and other cosmetic products.  Avoid anything that causes your rash. Keep a journal to help track what causes your rash. Write down: ? What you eat. ? What cosmetic products you use. ? What you drink. ? What you wear. This includes jewelry.  Keep all follow-up visits as told by your doctor. This is important. Contact a doctor if:  You sweat at night.  You lose weight.  You pee (urinate) more than normal.  You pee less than normal, or you notice that your pee is a darker color than normal.  You feel weak.  You throw up (vomit).  Your skin or the whites of your eyes look yellow (jaundice).  Your skin: ? Tingles. ? Is numb.  Your rash: ? Does not go away after a few days. ? Gets worse.  You are: ? More thirsty than normal. ? More tired than normal.  You have: ? New symptoms. ? Pain in your belly (abdomen). ? A fever. ? Watery poop (diarrhea). Get help right away if:  You have a fever and your symptoms suddenly get worse.  You start to feel mixed up (confused).  You have a very bad headache or a stiff neck.  You have very bad joint pains or stiffness.  You have jerky movements that you cannot control (seizure).  Your rash covers all or most of your body. The rash may or may not be painful.  You have blisters that: ? Are on top of the rash. ? Grow larger. ? Grow together. ? Are painful. ? Are inside your nose or mouth.  You have a rash that: ? Looks like purple pinprick-sized spots all over your body. ? Has a "bull's eye" or looks like a target. ? Is red and painful, causes your  skin to peel, and is not from being in the sun too long. Summary  A rash is a change in the color of your skin. A rash can also change the way your skin feels.  The goal of treatment is to stop the itching and keep the rash from spreading.  Take or apply over-the-counter and prescription medicines only as told by your doctor.  Contact a doctor if you have new symptoms or symptoms that get worse.  Keep all follow-up visits as told by your doctor. This is important. This information is not intended to replace advice given to you by your health care provider. Make sure you discuss any questions you have with your health care provider. Document Revised: 05/01/2018 Document Reviewed: 08/11/2017 Elsevier Patient Education  2020 Elsevier Inc.      Agustina Caroli, MD [facility]

## 2020-01-31 ENCOUNTER — Telehealth: Payer: BC Managed Care – PPO | Admitting: Emergency Medicine

## 2020-01-31 ENCOUNTER — Other Ambulatory Visit: Payer: Self-pay

## 2020-01-31 ENCOUNTER — Encounter: Payer: Self-pay | Admitting: Emergency Medicine

## 2020-01-31 VITALS — Ht 66.0 in | Wt 365.0 lb

## 2020-01-31 DIAGNOSIS — R6889 Other general symptoms and signs: Secondary | ICD-10-CM | POA: Diagnosis not present

## 2020-01-31 DIAGNOSIS — Z20822 Contact with and (suspected) exposure to covid-19: Secondary | ICD-10-CM | POA: Diagnosis not present

## 2020-01-31 DIAGNOSIS — Z6841 Body Mass Index (BMI) 40.0 and over, adult: Secondary | ICD-10-CM | POA: Diagnosis not present

## 2020-01-31 NOTE — Progress Notes (Signed)
Telemedicine Encounter- SOAP NOTE Established Patient Patient: Home  Provider: Office     This telephone encounter was conducted with the patient's (or proxy's) verbal consent via audio telecommunications: yes/no: Yes Patient was instructed to have this encounter in a suitably private space; and to only have persons present to whom they give permission to participate. In addition, patient identity was confirmed by use of name plus two identifiers (DOB and address).  I discussed the limitations, risks, security and privacy concerns of performing an evaluation and management service by telephone and the availability of in person appointments. I also discussed with the patient that there may be a patient responsible charge related to this service. The patient expressed understanding and agreed to proceed.  I spent a total of TIME; 0 MIN TO 60 MIN: 20 minutes talking with the patient or their proxy.  Chief Complaint  Patient presents with  . Cough    Productive with yellow mucus and sorethroat. Per patient it started last Thursday. Covid test at home with negative results  . Headache    Started last Thursday  . Vomiting    Subjective   Kendra Nguyen is a 40 y.o. female established patient. Telephone visit today complaining of flulike symptoms with sore throat and cough that started 4 days ago.  Slowly and progressively getting better.  Today she feels about 50% better.  Had exposure to COVID at work. COVID home test was negative.  73-year-old son with similar symptoms.  Patient had COVID in March 2021 in between vaccines. Denies any difficulty breathing, chest pain, or any other associated symptoms.  Able to eat and drink.  Not presently vomiting.  Denies abdominal pain or diarrhea.  No other complaints or medical concerns today.  HPI   Patient Active Problem List   Diagnosis Date Noted  . COVID-19 virus infection 04/05/2019  . S/P gastric bypass 12/18/2014  . Hereditary and  idiopathic peripheral neuropathy 06/07/2014  . Pure hypercholesterolemia 12/15/2013  . Vitamin D deficiency 12/15/2013  . Morbid obesity ([place]) 03/21/2011    Past Medical History:  Diagnosis Date  . Hx of laparoscopic gastric banding   . Hyperlipemia   . Hypertension    was taken of HTN meds by PCP "years ago"  . Morbid obesity with BMI of 45.0-49.9, adult ([place])   . OCD (obsessive compulsive disorder) 04/2016  . Varicose veins     Current Outpatient Medications  Medication Sig Dispense Refill  . ALPRAZolam (XANAX) 0.5 MG tablet Take 0.5 mg by mouth at bedtime as needed for anxiety.    . Cholecalciferol (VITAMIN D3) 1.25 MG (50000 UT) CAPS Take 1 capsule by mouth once a week.    Marland Kitchen FLUoxetine (PROZAC) 20 MG tablet Take 60 mg by mouth daily.    Marland Kitchen FLUoxetine (PROZAC) 40 MG capsule Take 40 mg by mouth daily.    . fluticasone (FLONASE) 50 MCG/ACT nasal spray Place into both nostrils daily.    Marland Kitchen loratadine (CLARITIN) 10 MG tablet Take 10 mg by mouth daily.    . metFORMIN (GLUCOPHAGE-XR) 500 MG 24 hr tablet Take 500 mg by mouth daily.     No current facility-administered medications for this visit.    Allergies  Allergen Reactions  . Tape     rash  . Neomycin Rash  . Other Itching and Rash    Social History   Socioeconomic History  . Marital status: Married    Spouse name: Not on file  . Number of children:  Not on file  . Years of education: Master's  . Highest education level: Not on file  Occupational History  . Occupation: Pharmacist, hospital- middle school  Tobacco Use  . Smoking status: Never Smoker  . Smokeless tobacco: Never Used  Vaping Use  . Vaping Use: Never used  Substance and Sexual Activity  . Alcohol use: Yes    Comment: occ  . Drug use: No  . Sexual activity: Not on file  Other Topics Concern  . Not on file  Social History Narrative   Lives at home by herself.   Caffeine use: Soda occass [name])   Engaged   Social Determinants of Health   Financial  Resource Strain: Not on file  Food Insecurity: Not on file  Transportation Needs: Not on file  Physical Activity: Not on file  Stress: Not on file  Social Connections: Not on file  Intimate Partner Violence: Not on file    Review of Systems  Constitutional: Negative.  Negative for chills and fever.  HENT: Positive for sore throat. Negative for congestion.   Respiratory: Positive for cough. Negative for hemoptysis, sputum production, shortness of breath and wheezing.   Cardiovascular: Negative.  Negative for chest pain and palpitations.  Gastrointestinal: Negative.  Negative for abdominal pain, diarrhea, nausea and vomiting.  Genitourinary: Negative.  Negative for dysuria and hematuria.  Musculoskeletal: Negative.  Negative for back pain, myalgias and neck pain.  Skin: Negative.  Negative for rash.  Neurological: Negative.  Negative for dizziness and headaches.  All other systems reviewed and are negative.   Objective   Vitals as reported by the patient: Today's Vitals   01/31/20 0907  Weight: (!) 365 lb (165.6 kg)  Height: 5\' 6"  (1.676 m)    There are no diagnoses linked to this encounter.  Raysa was seen today for cough, headache and vomiting.  Diagnoses and all orders for this visit:  Flu-like symptoms  Body mass index (BMI) of 50-59.9 in adult [facility])  Suspected COVID-19 virus infection  Exposure to COVID-19 virus    Clinically stable.  No red flag signs or symptoms.  COVID advice given. Advised to get tested for COVID.  ED precautions given. Advised to stay home until symptoms are all gone. Advised to contact the office if no better or worse during the next several days.  I discussed the assessment and treatment plan with the patient. The patient was provided an opportunity to ask questions and all were answered. The patient agreed with the plan and demonstrated an understanding of the instructions.   The patient was advised to call back or seek an in-person  evaluation if the symptoms worsen or if the condition fails to improve as anticipated.  I provided 20 minutes of non-face-to-face time during this encounter.  Horald Pollen, MD  [facility] at [facility]

## 2020-01-31 NOTE — Patient Instructions (Signed)
° ° ° °  If you have lab work done today you will be contacted with your lab results within the next 2 weeks.  If you have not heard from us then please contact us. The fastest way to get your results is to register for My Chart. ° ° °IF you received an x-ray today, you will receive an invoice from [facility]. Please contact  Radiology at [phone] with questions or concerns regarding your invoice.  ° °IF you received labwork today, you will receive an invoice from [facility]. Please contact [facility] at [phone] with questions or concerns regarding your invoice.  ° °Our billing staff will not be able to assist you with questions regarding bills from these companies. ° °You will be contacted with the lab results as soon as they are available. The fastest way to get your results is to activate your My Chart account. Instructions are located on the last page of this paperwork. If you have not heard from us regarding the results in 2 weeks, please contact this office. °  ° ° ° °

## 2020-05-24 ENCOUNTER — Other Ambulatory Visit: Payer: Self-pay

## 2020-05-24 ENCOUNTER — Telehealth: Payer: Self-pay | Admitting: Emergency Medicine

## 2020-05-24 ENCOUNTER — Ambulatory Visit: Payer: BC Managed Care – PPO | Admitting: Emergency Medicine

## 2020-05-24 ENCOUNTER — Encounter: Payer: Self-pay | Admitting: Emergency Medicine

## 2020-05-24 ENCOUNTER — Other Ambulatory Visit: Payer: Self-pay | Admitting: Emergency Medicine

## 2020-05-24 DIAGNOSIS — E1165 Type 2 diabetes mellitus with hyperglycemia: Secondary | ICD-10-CM

## 2020-05-24 DIAGNOSIS — Z6841 Body Mass Index (BMI) 40.0 and over, adult: Secondary | ICD-10-CM | POA: Diagnosis not present

## 2020-05-24 DIAGNOSIS — E785 Hyperlipidemia, unspecified: Secondary | ICD-10-CM

## 2020-05-24 DIAGNOSIS — M7989 Other specified soft tissue disorders: Secondary | ICD-10-CM | POA: Insufficient documentation

## 2020-05-24 DIAGNOSIS — R7303 Prediabetes: Secondary | ICD-10-CM | POA: Diagnosis not present

## 2020-05-24 DIAGNOSIS — Z8742 Personal history of other diseases of the female genital tract: Secondary | ICD-10-CM

## 2020-05-24 LAB — TSH: TSH: 1.67 u[IU]/mL (ref 0.35–4.50)

## 2020-05-24 LAB — LIPID PANEL
Cholesterol: 219 mg/dL — ABNORMAL HIGH (ref 0–200)
HDL: 50.7 mg/dL (ref >39.00)
LDL Cholesterol: 152 mg/dL — ABNORMAL HIGH (ref 0–99)
NonHDL: 168
Total CHOL/HDL Ratio: 4
Triglycerides: 82 mg/dL (ref 0.0–149.0)
VLDL: 16.4 mg/dL (ref 0.0–40.0)

## 2020-05-24 LAB — CBC WITH DIFFERENTIAL/PLATELET
Basophils Absolute: 0 10*3/uL (ref 0.0–0.1)
Basophils Relative: 0.6 % (ref 0.0–3.0)
Eosinophils Absolute: 0.1 10*3/uL (ref 0.0–0.7)
Eosinophils Relative: 2.1 % (ref 0.0–5.0)
HCT: 36.9 % (ref 36.0–46.0)
Hemoglobin: 12.6 g/dL (ref 12.0–15.0)
Lymphocytes Relative: 23 % (ref 12.0–46.0)
Lymphs Abs: 1.5 10*3/uL (ref 0.7–4.0)
MCHC: 34.1 g/dL (ref 30.0–36.0)
MCV: 84.3 fl (ref 78.0–100.0)
Monocytes Absolute: 0.4 10*3/uL (ref 0.1–1.0)
Monocytes Relative: 5.6 % (ref 3.0–12.0)
Neutro Abs: 4.4 10*3/uL (ref 1.4–7.7)
Neutrophils Relative %: 68.7 % (ref 43.0–77.0)
Platelets: 273 10*3/uL (ref 150.0–400.0)
RBC: 4.38 Mil/uL (ref 3.87–5.11)
RDW: 13.5 % (ref 11.5–15.5)
WBC: 6.4 10*3/uL (ref 4.0–10.5)

## 2020-05-24 LAB — HEMOGLOBIN A1C: Hgb A1c MFr Bld: 6.5 % (ref 4.6–6.5)

## 2020-05-24 LAB — COMPREHENSIVE METABOLIC PANEL
ALT: 11 U/L (ref 0–35)
AST: 13 U/L (ref 0–37)
Albumin: 3.7 g/dL (ref 3.5–5.2)
Alkaline Phosphatase: 70 U/L (ref 39–117)
BUN: 7 mg/dL (ref 6–23)
CO2: 27 mEq/L (ref 19–32)
Calcium: 8.7 mg/dL (ref 8.4–10.5)
Chloride: 105 mEq/L (ref 96–112)
Creatinine, Ser: 0.58 mg/dL (ref 0.40–1.20)
GFR: 113.84 mL/min (ref >60.00)
Glucose, Bld: 150 mg/dL — ABNORMAL HIGH (ref 70–99)
Potassium: 3.5 mEq/L (ref 3.5–5.1)
Sodium: 138 mEq/L (ref 135–145)
Total Bilirubin: 0.8 mg/dL (ref 0.2–1.2)
Total Protein: 6.6 g/dL (ref 6.0–8.3)

## 2020-05-24 MED ORDER — OZEMPIC (0.25 OR 0.5 MG/DOSE) 2 MG/1.5ML ~~LOC~~ SOPN: 0.5000 mg | PEN_INJECTOR | SUBCUTANEOUS | 5 refills | Status: DC

## 2020-05-24 MED ORDER — ROSUVASTATIN CALCIUM 10 MG PO TABS: 10.0000 mg | ORAL_TABLET | Freq: Every day | ORAL | 3 refills | Status: DC

## 2020-05-24 NOTE — Patient Instructions (Signed)
Prediabetes Eating Plan Prediabetes is a condition that causes blood sugar (glucose) levels to be higher than normal. This increases the risk for developing type 2 diabetes (type 2 diabetes mellitus). Working with a health care provider or nutrition specialist (dietitian) to make diet and lifestyle changes can help prevent the onset of diabetes. These changes may help you:  Control your blood glucose levels.  Improve your cholesterol levels.  Manage your blood pressure. What are tips for following this plan? Reading food labels  Read food labels to check the amount of fat, salt (sodium), and sugar in prepackaged foods. Avoid foods that have: ? Saturated fats. ? Trans fats. ? Added sugars.  Avoid foods that have more than 300 milligrams (mg) of sodium per serving. Limit your sodium intake to less than 2,300 mg each day. Shopping  Avoid buying pre-made and processed foods.  Avoid buying drinks with added sugar. Cooking  Cook with olive oil. Do not use butter, lard, or ghee.  Bake, broil, grill, steam, or boil foods. Avoid frying. Meal planning  Work with your dietitian to create an eating plan that is right for you. This may include tracking how many calories you take in each day. Use a food diary, notebook, or mobile application to track what you eat at each meal.  Consider following a Mediterranean diet. This includes: ? Eating several servings of fresh fruits and vegetables each day. ? Eating fish at least twice a week. ? Eating one serving each day of whole grains, beans, nuts, and seeds. ? Using olive oil instead of other fats. ? Limiting alcohol. ? Limiting red meat. ? Using nonfat or low-fat dairy products.  Consider following a plant-based diet. This includes dietary choices that focus on eating mostly vegetables and fruit, grains, beans, nuts, and seeds.  If you have high blood pressure, you may need to limit your sodium intake or follow a diet such as the DASH  (Dietary Approaches to Stop Hypertension) eating plan. The DASH diet aims to lower high blood pressure.   Lifestyle  Set weight loss goals with help from your health care team. It is recommended that most people with prediabetes lose 7% of their body weight.  Exercise for at least 30 minutes 5 or more days a week.  Attend a support group or seek support from a mental health counselor.  Take over-the-counter and prescription medicines only as told by your health care provider. What foods are recommended? Fruits Berries. Bananas. Apples. Oranges. Grapes. Papaya. Mango. Pomegranate. Kiwi. Grapefruit. Cherries. Vegetables Lettuce. Spinach. Peas. Beets. Cauliflower. Cabbage. Broccoli. Carrots. Tomatoes. Squash. Eggplant. Herbs. Peppers. Onions. Cucumbers. Brussels sprouts. Grains Whole grains, such as whole-wheat or whole-grain breads, crackers, cereals, and pasta. Unsweetened oatmeal. Bulgur. Barley. Quinoa. Brown rice. Corn or whole-wheat flour tortillas or taco shells. Meats and other proteins Seafood. Poultry without skin. Lean cuts of pork and beef. Tofu. Eggs. Nuts. Beans. Dairy Low-fat or fat-free dairy products, such as yogurt, cottage cheese, and cheese. Beverages Water. Tea. Coffee. Sugar-free or diet soda. Seltzer water. Low-fat or nonfat milk. Milk alternatives, such as soy or almond milk. Fats and oils Olive oil. Canola oil. Sunflower oil. Grapeseed oil. Avocado. Walnuts. Sweets and desserts Sugar-free or low-fat pudding. Sugar-free or low-fat ice cream and other frozen treats. Seasonings and condiments Herbs. Sodium-free spices. Mustard. Relish. Low-salt, low-sugar ketchup. Low-salt, low-sugar barbecue sauce. Low-fat or fat-free mayonnaise. The items listed above may not be a complete list of recommended foods and beverages. Contact a dietitian for more   information. What foods are not recommended? Fruits Fruits canned with syrup. Vegetables Canned vegetables. Frozen  vegetables with butter or cream sauce. Grains Refined white flour and flour products, such as bread, pasta, snack foods, and cereals. Meats and other proteins Fatty cuts of meat. Poultry with skin. Breaded or fried meat. Processed meats. Dairy Full-fat yogurt, cheese, or milk. Beverages Sweetened drinks, such as iced tea and soda. Fats and oils Butter. Lard. Ghee. Sweets and desserts Baked goods, such as cake, cupcakes, pastries, cookies, and cheesecake. Seasonings and condiments Spice mixes with added salt. Ketchup. Barbecue sauce. Mayonnaise. The items listed above may not be a complete list of foods and beverages that are not recommended. Contact a dietitian for more information. Where to find more information  American Diabetes Association: www.diabetes.org Summary  You may need to make diet and lifestyle changes to help prevent the onset of diabetes. These changes can help you control blood sugar, improve cholesterol levels, and manage blood pressure.  Set weight loss goals with help from your health care team. It is recommended that most people with prediabetes lose 7% of their body weight.  Consider following a Mediterranean diet. This includes eating plenty of fresh fruits and vegetables, whole grains, beans, nuts, seeds, fish, and low-fat dairy, and using olive oil instead of other fats. This information is not intended to replace advice given to you by your health care provider. Make sure you discuss any questions you have with your health care provider. Document Revised: 04/08/2019 Document Reviewed: 04/08/2019 Elsevier Patient Education  2021 Elsevier Inc.  

## 2020-05-24 NOTE — Assessment & Plan Note (Signed)
No history of DVT.  Ultrasound in the past have been negative x3. Ultrasound of left lower extremity ordered today.

## 2020-05-24 NOTE — Telephone Encounter (Signed)
Blood results discussed with patient. A1c in diabetic range. Elevated cholesterol. Ozempic started along with metformin. Will add rosuvastatin 10 mg daily. Follow-up in 3 months.

## 2020-05-24 NOTE — Progress Notes (Signed)
Kendra Nguyen 40 y.o.   Chief Complaint  Patient presents with  . Leg Swelling    Left   . Medication Problem    Per patient she wants to discuss Ozempic   Lab Results  Component Value Date   HGBA1C 5.5 08/26/2017    HISTORY OF PRESENT ILLNESS: This is a 40 y.o. female here with 2 concerns: 1.  Recently saw a fertility doctor.  A1c was up to 6.1.  Patient probably has PCOS and insulin resistance.  Recommended to start GLP treatment.  Inquiring about Ozempic.  Recently had dose of metformin increased to 2000 mg/day. 2.  Chronic left leg swelling.  Has had multiple negative ultrasounds in the past several years.  Has history of chronic venous insufficiency. Also has history of morbid obesity.  Needs referral to medical weight management clinic.  No other complaints or medical concerns today.  HPI   Prior to Admission medications   Medication Sig Start Date End Date Taking? Authorizing Provider  ALPRAZolam Duanne Moron) 0.5 MG tablet Take 0.5 mg by mouth at bedtime as needed for anxiety. Patient not taking: Reported on 01/31/2020    [provider]  Cholecalciferol (VITAMIN D3) 1.25 MG (50000 UT) CAPS Take 1 capsule by mouth once a week. 09/17/18   [provider]  FLUoxetine (PROZAC) 20 MG tablet Take 60 mg by mouth daily. 01/27/19   [provider]  FLUoxetine (PROZAC) 40 MG capsule Take 40 mg by mouth daily. Patient not taking: Reported on 01/31/2020    [provider]  fluticasone (FLONASE) 50 MCG/ACT nasal spray Place into both nostrils daily. Patient not taking: Reported on 01/31/2020    [provider]  loratadine (CLARITIN) 10 MG tablet Take 10 mg by mouth daily. Patient not taking: Reported on 01/31/2020    [provider]  metFORMIN (GLUCOPHAGE-XR) 500 MG 24 hr tablet Take 500 mg by mouth daily. 03/26/19   [provider]    Allergies  Allergen Reactions  . Tape     rash  . Neomycin Rash  . Other Itching and Rash     Patient Active Problem List   Diagnosis Date Noted  . S/P gastric bypass 12/18/2014  . 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 Pure hypercholesterolemia 12/15/2013  . Vitamin D deficiency 12/15/2013  . Morbid obesity ([place]) 03/21/2011    Past Medical History:  Diagnosis Date  . Hx of laparoscopic gastric banding   . Hyperlipemia   . Hypertension    was taken of HTN meds by PCP "years ago"  . Morbid obesity with BMI of 45.0-49.9, adult ([place])   . OCD (obsessive compulsive disorder) 04/2016  . Varicose veins     Past Surgical History:  Procedure Laterality Date  . CESAREAN SECTION N/A 01/18/2016   Procedure: CESAREAN SECTION;  Surgeon: Everett Graff, MD;  Location: [facility];  Service: Obstetrics;  Laterality: N/A;  . CESAREAN SECTION N/A    Phreesia 01/28/2020  . COLONOSCOPY WITH PROPOFOL N/A 11/24/2014   Procedure: COLONOSCOPY WITH PROPOFOL;  Surgeon: Milus Banister, MD;  Location: [facility];  Service: Endoscopy;  Laterality: N/A;  . LAPAROSCOPIC GASTRIC BANDING  04/17/2011  . [place] RESECTION     2016  . WISDOM TOOTH EXTRACTION      Social History   Socioeconomic History  . Marital status: Married    Spouse name: Not on file  . Number of children: Not on file  . Years of education: Master's  .  Highest education level: Not on file  Occupational History  . Occupation: Pharmacist, hospital- middle school  Tobacco Use  . Smoking status: Never Smoker  . Smokeless tobacco: Never Used  Vaping Use  . Vaping Use: Never used  Substance and Sexual Activity  . Alcohol use: Yes    Comment: occ  . Drug use: No  . Sexual activity: Not on file  Other Topics Concern  . Not on file  Social History Narrative   Lives at home by herself.   Caffeine use: Soda occass [name])   Engaged   Social Determinants of Health   Financial Resource Strain: Not on file  Food Insecurity: Not on file  Transportation Needs: Not on file   Physical Activity: Not on file  Stress: Not on file  Social Connections: Not on file  Intimate Partner Violence: Not on file    Family History  Problem Relation Age of Onset  . Diabetes Mother   . Heart disease Mother   . Arthritis Father        gout  . COPD Father        smoker  . Hyperlipidemia Father   . Cancer Father        stomach  . Aneurysm Sister 34       cerebral;   . COPD Paternal 64   . Heart disease Maternal Grandmother   . Heart disease Maternal Grandfather   . Neuropathy Neg Hx      Review of Systems  Constitutional: Negative.  Negative for chills and fever.  HENT: Negative.  Negative for congestion and sore throat.   Respiratory: Negative.  Negative for cough and shortness of breath.   Cardiovascular: Positive for leg swelling. Negative for chest pain and palpitations.  Gastrointestinal: Negative for abdominal pain, diarrhea, nausea and vomiting.  Genitourinary: Negative.  Negative for dysuria and hematuria.  Skin: Negative.  Negative for rash.  Neurological: Negative.  Negative for dizziness and headaches.  All other systems reviewed and are negative.   Today's Vitals   05/24/20 0834  BP: 110/88  Pulse: 85  Temp: 98.3 F (36.8 C)  TempSrc: Oral  SpO2: 98%  Weight: (!) 379 lb (171.9 kg)  Height: 5\' 6"  (1.676 m)   Body mass index is 61.17 kg/m.  Physical Exam Vitals reviewed.  Constitutional:      Appearance: She is obese.  HENT:     Head: Normocephalic.  Eyes:     Extraocular Movements: Extraocular movements intact.     Conjunctiva/sclera: Conjunctivae normal.     Pupils: Pupils are equal, round, and reactive to light.  Cardiovascular:     Rate and Rhythm: Normal rate and regular rhythm.     Pulses: Normal pulses.     Heart sounds: Normal heart sounds.  Pulmonary:     Effort: Pulmonary effort is normal.     Breath sounds: Normal breath sounds.  Musculoskeletal:     Cervical back: Normal range of motion and neck supple.      Comments: Lower extremities: Very obese.  Hard to tell a difference between the 2 legs.  No tenderness.  Neurovascularly intact.  Skin:    General: Skin is warm and dry.     Capillary Refill: Capillary refill takes less than 2 seconds.  Neurological:     General: No focal deficit present.     Mental Status: She is alert and oriented to person, place, and time.  Psychiatric:        Mood and Affect: Mood  normal.        Behavior: Behavior normal.      ASSESSMENT & PLAN: A total of 30 minutes was spent with the patient and counseling/coordination of care regarding morbid obesity and need to establish herself with medical weight management clinic, education on nutrition, role of GLP's and treatment of diabetes/prediabetes and weight loss, review of all medications in particular metformin and Ozempic as a new medication, cardiovascular risks associated with morbid obesity and prediabetes, review of most recent office visit notes, review of most recent blood work results, need of ultrasound of left lower extremity to rule out DVT, prognosis, documentation, and need for follow-up.  Morbid obesity ([place]) Diet and nutrition discussed.  Referred to medical weight management clinic. GLP treatment with Ozempic started.  Prediabetes Metformin recently increased to 2000 mg daily.  GLP treatment with Ozempic started today.  Diet and nutrition discussed.  Left leg swelling No history of DVT.  Ultrasound in the past have been negative x3. Ultrasound of left lower extremity ordered today.  Zyriah was seen today for leg swelling and medication problem.  Diagnoses and all orders for this visit:  Morbid obesity ([place]) -     Amb Ref to Medical Weight Management -     CBC with Differential/Platelet -     Comprehensive metabolic panel -     Hemoglobin A1c -     Lipid panel -     TSH -     Semaglutide,0.25 or 0.5MG /DOS, (OZEMPIC, 0.25 OR 0.5 MG/DOSE,) 2 MG/1.5ML SOPN; Inject 0.5 mg into the skin once a  week.  Prediabetes -     CBC with Differential/Platelet -     Comprehensive metabolic panel -     Hemoglobin A1c -     Lipid panel -     TSH -     Semaglutide,0.25 or 0.5MG /DOS, (OZEMPIC, 0.25 OR 0.5 MG/DOSE,) 2 MG/1.5ML SOPN; Inject 0.5 mg into the skin once a week.  History of PCOS  Left leg swelling -     VAS [place] LOWER EXTREMITY VENOUS (DVT); Future    Patient Instructions   Prediabetes Eating Plan Prediabetes is a condition that causes blood sugar (glucose) levels to be higher than normal. This increases the risk for developing type 2 diabetes (type 2 diabetes mellitus). Working with a health care provider or nutrition specialist (dietitian) to make diet and lifestyle changes can help prevent the onset of diabetes. These changes may help you:  Control your blood glucose levels.  Improve your cholesterol levels.  Manage your blood pressure. What are tips for following this plan? Reading food labels  Read food labels to check the amount of fat, salt (sodium), and sugar in prepackaged foods. Avoid foods that have: ? Saturated fats. ? Trans fats. ? Added sugars.  Avoid foods that have more than 300 milligrams (mg) of sodium per serving. Limit your sodium intake to less than 2,300 mg each day. Shopping  Avoid buying pre-made and processed foods.  Avoid buying drinks with added sugar. Cooking  Cook with olive oil. Do not use butter, lard, or ghee.  Bake, broil, grill, steam, or boil foods. Avoid frying. Meal planning  Work with your dietitian to create an eating plan that is right for you. This may include tracking how many calories you take in each day. Use a food diary, notebook, or mobile application to track what you eat at each meal.  Consider following a Mediterranean diet. This includes: ? Eating several servings of fresh fruits  and vegetables each day. ? Eating fish at least twice a week. ? Eating one serving each day of whole grains, beans, nuts, and  seeds. ? Using olive oil instead of other fats. ? Limiting alcohol. ? Limiting red meat. ? Using nonfat or low-fat dairy products.  Consider following a plant-based diet. This includes dietary choices that focus on eating mostly vegetables and fruit, grains, beans, nuts, and seeds.  If you have high blood pressure, you may need to limit your sodium intake or follow a diet such as the DASH (Dietary Approaches to Stop Hypertension) eating plan. The DASH diet aims to lower high blood pressure.   Lifestyle  Set weight loss goals with help from your health care team. It is recommended that most people with prediabetes lose 7% of their body weight.  Exercise for at least 30 minutes 5 or more days a week.  Attend a support group or seek support from a mental health counselor.  Take over-the-counter and prescription medicines only as told by your health care provider. What foods are recommended? Fruits Berries. Bananas. Apples. Oranges. Grapes. Papaya. Mango. Pomegranate. Kiwi. Grapefruit. Cherries. Vegetables Lettuce. Spinach. Peas. Beets. Cauliflower. Cabbage. Broccoli. Carrots. Tomatoes. Squash. Eggplant. Herbs. Peppers. Onions. Cucumbers. Brussels sprouts. Grains Whole grains, such as whole-wheat or whole-grain breads, crackers, cereals, and pasta. Unsweetened oatmeal. Bulgur. Barley. Quinoa. Brown rice. Corn or whole-wheat flour tortillas or taco shells. Meats and other proteins Seafood. Poultry without skin. Lean cuts of pork and beef. Tofu. Eggs. Nuts. Beans. Dairy Low-fat or fat-free dairy products, such as yogurt, cottage cheese, and cheese. Beverages Water. Tea. Coffee. Sugar-free or diet soda. Seltzer water. Low-fat or nonfat milk. Milk alternatives, such as soy or almond milk. Fats and oils Olive oil. Canola oil. Sunflower oil. Grapeseed oil. Avocado. Walnuts. Sweets and desserts Sugar-free or low-fat pudding. Sugar-free or low-fat ice cream and other frozen treats. Seasonings  and condiments Herbs. Sodium-free spices. Mustard. Relish. Low-salt, low-sugar ketchup. Low-salt, low-sugar barbecue sauce. Low-fat or fat-free mayonnaise. The items listed above may not be a complete list of recommended foods and beverages. Contact a dietitian for more information. What foods are not recommended? Fruits Fruits canned with syrup. Vegetables Canned vegetables. Frozen vegetables with butter or cream sauce. Grains Refined white flour and flour products, such as bread, pasta, snack foods, and cereals. Meats and other proteins Fatty cuts of meat. Poultry with skin. Breaded or fried meat. Processed meats. Dairy Full-fat yogurt, cheese, or milk. Beverages Sweetened drinks, such as iced tea and soda. Fats and oils Butter. Lard. Ghee. Sweets and desserts Baked goods, such as cake, cupcakes, pastries, cookies, and cheesecake. Seasonings and condiments Spice mixes with added salt. Ketchup. Barbecue sauce. Mayonnaise. The items listed above may not be a complete list of foods and beverages that are not recommended. Contact a dietitian for more information. Where to find more information  American Diabetes Association: www.diabetes.org Summary  You may need to make diet and lifestyle changes to help prevent the onset of diabetes. These changes can help you control blood sugar, improve cholesterol levels, and manage blood pressure.  Set weight loss goals with help from your health care team. It is recommended that most people with prediabetes lose 7% of their body weight.  Consider following a Mediterranean diet. This includes eating plenty of fresh fruits and vegetables, whole grains, beans, nuts, seeds, fish, and low-fat dairy, and using olive oil instead of other fats. This information is not intended to replace advice given to you by your  health care provider. Make sure you discuss any questions you have with your health care provider. Document Revised: 04/08/2019 Document  Reviewed: 04/08/2019 Elsevier Patient Education  2021 [place], MD [facility]

## 2020-05-24 NOTE — Telephone Encounter (Signed)
Blood results discussed with patient. 

## 2020-05-24 NOTE — Assessment & Plan Note (Signed)
Diet and nutrition discussed.  Referred to medical weight management clinic. GLP treatment with Ozempic started.

## 2020-05-24 NOTE — Assessment & Plan Note (Addendum)
Metformin recently increased to 2000 mg daily.  GLP treatment with Ozempic started today.  Diet and nutrition discussed.

## 2020-06-16 ENCOUNTER — Ambulatory Visit (HOSPITAL_COMMUNITY): Payer: BC Managed Care – PPO | Attending: Emergency Medicine

## 2020-07-27 ENCOUNTER — Ambulatory Visit: Payer: BC Managed Care – PPO | Admitting: Emergency Medicine

## 2020-07-27 ENCOUNTER — Encounter: Payer: Self-pay | Admitting: Emergency Medicine

## 2020-07-27 ENCOUNTER — Other Ambulatory Visit: Payer: Self-pay

## 2020-07-27 DIAGNOSIS — E1165 Type 2 diabetes mellitus with hyperglycemia: Secondary | ICD-10-CM | POA: Diagnosis not present

## 2020-07-27 DIAGNOSIS — E785 Hyperlipidemia, unspecified: Secondary | ICD-10-CM

## 2020-07-27 NOTE — Progress Notes (Signed)
Kendra Nguyen 40 y.o.   Chief Complaint  Patient presents with   Follow-up    HISTORY OF PRESENT ILLNESS: This is a 39 y.o. female here for follow-up of visit from 05/24/2020. Morbid obesity and borderline diabetes.  Presently on metformin 1000 mg twice a day. Started on Ozempic 0.5 mg weekly during the last visit.  Gets occasional abdominal cramping but overall tolerating it well. Just started the medication 2 weeks ago.  Was referred to medical weight management clinic and has appointment for next week. No other complaints or medical concerns today.  HPI   Prior to Admission medications   Medication Sig Start Date End Date Taking? Authorizing Provider  ARIPiprazole (ABILIFY) 5 MG tablet Take 5 mg by mouth daily.   Yes [provider]  Cholecalciferol (VITAMIN D3) 1.25 MG (50000 UT) CAPS Take 1 capsule by mouth once a week. 09/17/18  Yes [provider]  FLUoxetine (PROZAC) 20 MG tablet Take 60 mg by mouth daily. 01/27/19  Yes [provider]  rosuvastatin (CRESTOR) 10 MG tablet Take 1 tablet (10 mg total) by mouth daily. 05/24/20  Yes Kendra Nguyen, Kendra Bloomer, MD  Semaglutide,0.25 or 0.5MG /DOS, (OZEMPIC, 0.25 OR 0.5 MG/DOSE,) 2 MG/1.5ML SOPN Inject 0.5 mg into the skin once a week. 05/24/20  Yes Kendra Nguyen, Kendra Bloomer, MD  metFORMIN (GLUCOPHAGE-XR) 500 MG 24 hr tablet Take 500 mg by mouth daily. Patient not taking: Reported on 07/27/2020 03/26/19   [provider]    Allergies  Allergen Reactions   Tape     rash   Neomycin Rash   Other Itching and Rash    Patient Active Problem List   Diagnosis Date Noted   Left leg swelling 05/24/2020   History of PCOS 05/24/2020   Prediabetes 05/24/2020   S/P gastric bypass 12/18/2014   Hereditary and idiopathic peripheral neuropathy 06/07/2014   Pure hypercholesterolemia 12/15/2013   Vitamin D deficiency 12/15/2013   Morbid obesity ([place]) 03/21/2011    Past Medical History:  Diagnosis Date   Hx of  laparoscopic gastric banding    Hyperlipemia    Hypertension    was taken of HTN meds by PCP "years ago"   Morbid obesity with BMI of 45.0-49.9, adult ([place])    OCD (obsessive compulsive disorder) 04/2016   Varicose veins     Past Surgical History:  Procedure Laterality Date   CESAREAN SECTION N/A 01/18/2016   Procedure: CESAREAN SECTION;  Surgeon: Kendra Graff, MD;  Location: [facility];  Service: Obstetrics;  Laterality: N/A;   CESAREAN SECTION N/A    Phreesia 01/28/2020   COLONOSCOPY WITH PROPOFOL N/A 11/24/2014   Procedure: COLONOSCOPY WITH PROPOFOL;  Surgeon: Kendra Banister, MD;  Location: [facility];  Service: Endoscopy;  Laterality: N/A;   LAPAROSCOPIC GASTRIC BANDING  04/17/2011   LAPAROSCOPIC GASTRIC SLEEVE RESECTION     2016   WISDOM TOOTH EXTRACTION      Social History   Socioeconomic History   Marital status: Married    Spouse name: Not on file   Number of children: Not on file   Years of education: Master's   Highest education level: Not on file  Occupational History   Occupation: Pharmacist, hospital- middle school  Tobacco Use   Smoking status: Never   Smokeless tobacco: Never  Vaping Use   Vaping Use: Never used  Substance and Sexual Activity   Alcohol use: Yes    Comment: occ   Drug use: No   Sexual activity: Not on file  Other Topics Concern   Not on file  Social History Narrative   Lives at home by herself.   Caffeine use: Soda occass Kendra Nguyen)   Engaged   Social Determinants of Health   Financial Resource Strain: Not on file  Food Insecurity: Not on file  Transportation Needs: Not on file  Physical Activity: Not on file  Stress: Not on file  Social Connections: Not on file  Intimate Partner Violence: Not on file    Family History  Problem Relation Age of Onset   Diabetes Mother    Heart disease Mother    Arthritis Father        gout   COPD Father        smoker   Hyperlipidemia Father    Cancer Father        stomach   Aneurysm  Sister 49       cerebral;    COPD Paternal Aunt    Heart disease Maternal Grandmother    Heart disease Maternal Grandfather    Neuropathy Neg Hx      Review of Systems  Constitutional: Negative.  Negative for chills and fever.  HENT: Negative.  Negative for congestion and sore throat.   Respiratory: Negative.  Negative for cough and shortness of breath.   Cardiovascular: Negative.  Negative for chest pain and palpitations.  Gastrointestinal:  Positive for abdominal pain (Occasional cramps). Negative for nausea and vomiting.  Genitourinary: Negative.   Skin: Negative.  Negative for rash.  Neurological:  Negative for dizziness and headaches.  All other systems reviewed and are negative.  Today's Vitals   07/27/20 1627  BP: 122/76  Pulse: 85  SpO2: 97%  Weight: (!) 379 lb (171.9 kg)  Height: 5\' 6"  (1.676 m)   Body mass index is 61.17 kg/m. Wt Readings from Last 3 Encounters:  07/27/20 (!) 379 lb (171.9 kg)  05/24/20 (!) 379 lb (171.9 kg)  01/31/20 (!) 365 lb (165.6 kg)    Physical Exam Vitals reviewed.  Constitutional:      Appearance: Normal appearance. She is obese.  HENT:     Head: Normocephalic.  Eyes:     Extraocular Movements: Extraocular movements intact.     Pupils: Pupils are equal, round, and reactive to light.  Cardiovascular:     Rate and Rhythm: Normal rate.  Pulmonary:     Effort: Pulmonary effort is normal.  Musculoskeletal:        General: Normal range of motion.     Cervical back: Normal range of motion.  Skin:    General: Skin is warm and dry.     Capillary Refill: Capillary refill takes less than 2 seconds.  Neurological:     General: No focal deficit present.     Mental Status: She is alert and oriented to person, place, and time.  Psychiatric:        Mood and Affect: Mood normal.        Behavior: Behavior normal.     ASSESSMENT & PLAN: Clinically stable.  Tolerating medications well. Continue metformin 1000 mg twice a day and weekly  Ozempic 0.5 mg subcu. Has appointment to medical weight management clinic next week. No medical concerns identified during this visit.  Kendra Nguyen was seen today for follow-up.  Diagnoses and all orders for this visit:  Morbid obesity ([place])  Type 2 diabetes mellitus with hyperglycemia, without long-term current use of insulin ([place])  Dyslipidemia  Patient Instructions  Diabetes Mellitus and Nutrition, Adult When you have diabetes,  or diabetes mellitus, it is very important to have healthy eating habits because your blood sugar (glucose) levels are greatly affected by what you eat and drink. Eating healthy foods in the right amounts, at about the same times every day, can help you: Control your blood glucose. Lower your risk of heart disease. Improve your blood pressure. Reach or maintain a healthy weight. What can affect my meal plan? Every person with diabetes is different, and each person has different needs for a meal plan. Your health care provider may recommend that you work with a dietitian to make a meal plan that is best for you. Your meal plan may vary depending on factors such as: The calories you need. The medicines you take. Your weight. Your blood glucose, blood pressure, and cholesterol levels. Your activity level. Other health conditions you have, such as heart or kidney disease. How do carbohydrates affect me? Carbohydrates, also called carbs, affect your blood glucose level more than any other type of food. Eating carbs naturally raises the amount of glucose in your blood. Carb counting is a method for keeping track of how many carbs you eat. Counting carbs is important to keep your blood glucose at a healthy level,especially if you use insulin or take certain oral diabetes medicines. It is important to know how many carbs you can safely have in each meal. This is different for every person. Your dietitian can help you calculate how manycarbs you should have at each meal  and for each snack. How does alcohol affect me? Alcohol can cause a sudden decrease in blood glucose (hypoglycemia), especially if you use insulin or take certain oral diabetes medicines. Hypoglycemia can be a life-threatening condition. Symptoms of hypoglycemia, such as sleepiness, dizziness, and confusion, are similar to symptoms of having too much alcohol. Do not drink alcohol if: Your health care provider tells you not to drink. You are pregnant, may be pregnant, or are planning to become pregnant. If you drink alcohol: Do not drink on an empty stomach. Limit how much you use to: 0-1 drink a day for women. 0-2 drinks a day for men. Be aware of how much alcohol is in your drink. In the [place] one drink equals one 12 oz bottle of beer (355 mL), one 5 oz glass of wine (148 mL), or one 1 oz glass of hard liquor (44 mL). Keep yourself hydrated with water, diet soda, or unsweetened iced tea. Keep in mind that regular soda, juice, and other mixers may contain a lot of sugar and must be counted as carbs. What are tips for following this plan?  Reading food labels Start by checking the serving size on the "Nutrition Facts" label of packaged foods and drinks. The amount of calories, carbs, fats, and other nutrients listed on the label is based on one serving of the item. Many items contain more than one serving per package. Check the total grams (g) of carbs in one serving. You can calculate the number of servings of carbs in one serving by dividing the total carbs by 15. For example, if a food has 30 g of total carbs per serving, it would be equal to 2 servings of carbs. Check the number of grams (g) of saturated fats and trans fats in one serving. Choose foods that have a low amount or none of these fats. Check the number of milligrams (mg) of salt (sodium) in one serving. Most people should limit total sodium intake to less than 2,300 mg per day.  Always check the nutrition information of foods  labeled as "low-fat" or "nonfat." These foods may be higher in added sugar or refined carbs and should be avoided. Talk to your dietitian to identify your daily goals for nutrients listed on the label. Shopping Avoid buying canned, pre-made, or processed foods. These foods tend to be high in fat, sodium, and added sugar. Shop around the outside edge of the grocery store. This is where you will most often find fresh fruits and vegetables, bulk grains, fresh meats, and fresh dairy. Cooking Use low-heat cooking methods, such as baking, instead of high-heat cooking methods like deep frying. Cook using healthy oils, such as olive, canola, or sunflower oil. Avoid cooking with butter, cream, or high-fat meats. Meal planning Eat meals and snacks regularly, preferably at the same times every day. Avoid going long periods of time without eating. Eat foods that are high in fiber, such as fresh fruits, vegetables, beans, and whole grains. Talk with your dietitian about how many servings of carbs you can eat at each meal. Eat 4-6 oz (112-168 g) of lean protein each day, such as lean meat, chicken, fish, eggs, or tofu. One ounce (oz) of lean protein is equal to: 1 oz (28 g) of meat, chicken, or fish. 1 egg.  cup (62 g) of tofu. Eat some foods each day that contain healthy fats, such as avocado, nuts, seeds, and fish. What foods should I eat? Fruits Berries. Apples. Oranges. Peaches. Apricots. Plums. Grapes. Mango. Papaya.Pomegranate. Kiwi. Cherries. Vegetables Lettuce. Spinach. Leafy greens, including kale, chard, collard greens, and mustard greens. Beets. Cauliflower. Cabbage. Broccoli. Carrots. Green beans.Tomatoes. Peppers. Onions. Cucumbers. Brussels sprouts. Grains Whole grains, such as whole-wheat or whole-grain bread, crackers, tortillas,cereal, and pasta. Unsweetened oatmeal. Quinoa. Brown or wild rice. Meats and other proteins Seafood. Poultry without skin. Lean cuts of poultry and beef. Tofu.  Nuts. Seeds. Dairy Low-fat or fat-free dairy products such as milk, yogurt, and cheese. The items listed above may not be a complete list of foods and beverages you can eat. Contact a dietitian for more information. What foods should I avoid? Fruits Fruits canned with syrup. Vegetables Canned vegetables. Frozen vegetables with butter or cream sauce. Grains Refined white flour and flour products such as bread, pasta, snack foods, andcereals. Avoid all processed foods. Meats and other proteins Fatty cuts of meat. Poultry with skin. Breaded or fried meats. Processed meat.Avoid saturated fats. Dairy Full-fat yogurt, cheese, or milk. Beverages Sweetened drinks, such as soda or iced tea. The items listed above may not be a complete list of foods and beverages you should avoid. Contact a dietitian for more information. Questions to ask a health care provider Do I need to meet with a diabetes educator? Do I need to meet with a dietitian? What number can I call if I have questions? When are the best times to check my blood glucose? Where to find more information: American Diabetes Association: diabetes.org Academy of Nutrition and Dietetics: www.eatright.[employer] of Diabetes and Digestive and Kidney Diseases: [place] Association of Diabetes Care and Education Specialists: www.diabeteseducator.org Summary It is important to have healthy eating habits because your blood sugar (glucose) levels are greatly affected by what you eat and drink. A healthy meal plan will help you control your blood glucose and maintain a healthy lifestyle. Your health care provider may recommend that you work with a dietitian to make a meal plan that is best for you. Keep in mind that carbohydrates (carbs) and alcohol have immediate effects  on your blood glucose levels. It is important to count carbs and to use alcohol carefully. This information is not intended to replace advice given to you  by your health care provider. Make sure you discuss any questions you have with your healthcare provider. Document Revised: 12/15/2018 Document Reviewed: 12/15/2018 Elsevier Patient Education  2021 [place], [place] [facility]

## 2020-07-27 NOTE — Patient Instructions (Signed)
Diabetes Mellitus and Nutrition, Adult When you have diabetes, or diabetes mellitus, it is very important to have healthy eating habits because your blood sugar (glucose) levels are greatly affected by what you eat and drink. Eating healthy foods in the right amounts, at about the same times every day, can help you:  Control your blood glucose.  Lower your risk of heart disease.  Improve your blood pressure.  Reach or maintain a healthy weight. What can affect my meal plan? Every person with diabetes is different, and each person has different needs for a meal plan. Your health care provider may recommend that you work with a dietitian to make a meal plan that is best for you. Your meal plan may vary depending on factors such as:  The calories you need.  The medicines you take.  Your weight.  Your blood glucose, blood pressure, and cholesterol levels.  Your activity level.  Other health conditions you have, such as heart or kidney disease. How do carbohydrates affect me? Carbohydrates, also called carbs, affect your blood glucose level more than any other type of food. Eating carbs naturally raises the amount of glucose in your blood. Carb counting is a method for keeping track of how many carbs you eat. Counting carbs is important to keep your blood glucose at a healthy level, especially if you use insulin or take certain oral diabetes medicines. It is important to know how many carbs you can safely have in each meal. This is different for every person. Your dietitian can help you calculate how many carbs you should have at each meal and for each snack. How does alcohol affect me? Alcohol can cause a sudden decrease in blood glucose (hypoglycemia), especially if you use insulin or take certain oral diabetes medicines. Hypoglycemia can be a life-threatening condition. Symptoms of hypoglycemia, such as sleepiness, dizziness, and confusion, are similar to symptoms of having too much  alcohol.  Do not drink alcohol if: ? Your health care provider tells you not to drink. ? You are pregnant, may be pregnant, or are planning to become pregnant.  If you drink alcohol: ? Do not drink on an empty stomach. ? Limit how much you use to:  0-1 drink a day for women.  0-2 drinks a day for men. ? Be aware of how much alcohol is in your drink. In the [place] one drink equals one 12 oz bottle of beer (355 mL), one 5 oz glass of wine (148 mL), or one 1 oz glass of hard liquor (44 mL). ? Keep yourself hydrated with water, diet soda, or unsweetened iced tea.  Keep in mind that regular soda, juice, and other mixers may contain a lot of sugar and must be counted as carbs. What are tips for following this plan? Reading food labels  Start by checking the serving size on the "Nutrition Facts" label of packaged foods and drinks. The amount of calories, carbs, fats, and other nutrients listed on the label is based on one serving of the item. Many items contain more than one serving per package.  Check the total grams (g) of carbs in one serving. You can calculate the number of servings of carbs in one serving by dividing the total carbs by 15. For example, if a food has 30 g of total carbs per serving, it would be equal to 2 servings of carbs.  Check the number of grams (g) of saturated fats and trans fats in one serving. Choose foods that have   a low amount or none of these fats.  Check the number of milligrams (mg) of salt (sodium) in one serving. Most people should limit total sodium intake to less than 2,300 mg per day.  Always check the nutrition information of foods labeled as "low-fat" or "nonfat." These foods may be higher in added sugar or refined carbs and should be avoided.  Talk to your dietitian to identify your daily goals for nutrients listed on the label. Shopping  Avoid buying canned, pre-made, or processed foods. These foods tend to be high in fat, sodium, and added  sugar.  Shop around the outside edge of the grocery store. This is where you will most often find fresh fruits and vegetables, bulk grains, fresh meats, and fresh dairy. Cooking  Use low-heat cooking methods, such as baking, instead of high-heat cooking methods like deep frying.  Cook using healthy oils, such as olive, canola, or sunflower oil.  Avoid cooking with butter, cream, or high-fat meats. Meal planning  Eat meals and snacks regularly, preferably at the same times every day. Avoid going long periods of time without eating.  Eat foods that are high in fiber, such as fresh fruits, vegetables, beans, and whole grains. Talk with your dietitian about how many servings of carbs you can eat at each meal.  Eat 4-6 oz (112-168 g) of lean protein each day, such as lean meat, chicken, fish, eggs, or tofu. One ounce (oz) of lean protein is equal to: ? 1 oz (28 g) of meat, chicken, or fish. ? 1 egg. ?  cup (62 g) of tofu.  Eat some foods each day that contain healthy fats, such as avocado, nuts, seeds, and fish.   What foods should I eat? Fruits Berries. Apples. Oranges. Peaches. Apricots. Plums. Grapes. Mango. Papaya. Pomegranate. Kiwi. Cherries. Vegetables Lettuce. Spinach. Leafy greens, including kale, chard, collard greens, and mustard greens. Beets. Cauliflower. Cabbage. Broccoli. Carrots. Green beans. Tomatoes. Peppers. Onions. Cucumbers. Brussels sprouts. Grains Whole grains, such as whole-wheat or whole-grain bread, crackers, tortillas, cereal, and pasta. Unsweetened oatmeal. Quinoa. Brown or wild rice. Meats and other proteins Seafood. Poultry without skin. Lean cuts of poultry and beef. Tofu. Nuts. Seeds. Dairy Low-fat or fat-free dairy products such as milk, yogurt, and cheese. The items listed above may not be a complete list of foods and beverages you can eat. Contact a dietitian for more information. What foods should I avoid? Fruits Fruits canned with  syrup. Vegetables Canned vegetables. Frozen vegetables with butter or cream sauce. Grains Refined white flour and flour products such as bread, pasta, snack foods, and cereals. Avoid all processed foods. Meats and other proteins Fatty cuts of meat. Poultry with skin. Breaded or fried meats. Processed meat. Avoid saturated fats. Dairy Full-fat yogurt, cheese, or milk. Beverages Sweetened drinks, such as soda or iced tea. The items listed above may not be a complete list of foods and beverages you should avoid. Contact a dietitian for more information. Questions to ask a health care provider  Do I need to meet with a diabetes educator?  Do I need to meet with a dietitian?  What number can I call if I have questions?  When are the best times to check my blood glucose? Where to find more information:  American Diabetes Association: diabetes.org  Academy of Nutrition and Dietetics: www.eatright.org  National Institute of Diabetes and Digestive and Kidney Diseases: www.niddk.nih.gov  Association of Diabetes Care and Education Specialists: www.diabeteseducator.org Summary  It is important to have healthy eating   habits because your blood sugar (glucose) levels are greatly affected by what you eat and drink.  A healthy meal plan will help you control your blood glucose and maintain a healthy lifestyle.  Your health care provider may recommend that you work with a dietitian to make a meal plan that is best for you.  Keep in mind that carbohydrates (carbs) and alcohol have immediate effects on your blood glucose levels. It is important to count carbs and to use alcohol carefully. This information is not intended to replace advice given to you by your health care provider. Make sure you discuss any questions you have with your health care provider. Document Revised: 12/15/2018 Document Reviewed: 12/15/2018 Elsevier Patient Education  2021 [employer] Inc[employer]  

## 2020-08-02 ENCOUNTER — Encounter (INDEPENDENT_AMBULATORY_CARE_PROVIDER_SITE_OTHER): Payer: Self-pay | Admitting: Bariatrics

## 2020-08-02 ENCOUNTER — Other Ambulatory Visit: Payer: Self-pay

## 2020-08-02 ENCOUNTER — Ambulatory Visit (INDEPENDENT_AMBULATORY_CARE_PROVIDER_SITE_OTHER): Payer: BC Managed Care – PPO | Admitting: Bariatrics

## 2020-08-02 VITALS — BP 131/77 | HR 75 | Temp 98.5°F | Ht 66.0 in | Wt 377.0 lb

## 2020-08-02 DIAGNOSIS — E282 Polycystic ovarian syndrome: Secondary | ICD-10-CM

## 2020-08-02 DIAGNOSIS — R5383 Other fatigue: Secondary | ICD-10-CM | POA: Diagnosis not present

## 2020-08-02 DIAGNOSIS — E669 Obesity, unspecified: Secondary | ICD-10-CM

## 2020-08-02 DIAGNOSIS — R0602 Shortness of breath: Secondary | ICD-10-CM

## 2020-08-02 DIAGNOSIS — E1169 Type 2 diabetes mellitus with other specified complication: Secondary | ICD-10-CM

## 2020-08-02 DIAGNOSIS — Z6841 Body Mass Index (BMI) 40.0 and over, adult: Secondary | ICD-10-CM

## 2020-08-02 DIAGNOSIS — Z9189 Other specified personal risk factors, not elsewhere classified: Secondary | ICD-10-CM | POA: Diagnosis not present

## 2020-08-02 DIAGNOSIS — Z1331 Encounter for screening for depression: Secondary | ICD-10-CM

## 2020-08-02 DIAGNOSIS — E7800 Pure hypercholesterolemia, unspecified: Secondary | ICD-10-CM

## 2020-08-02 DIAGNOSIS — Z9884 Bariatric surgery status: Secondary | ICD-10-CM

## 2020-08-02 DIAGNOSIS — Z0289 Encounter for other administrative examinations: Secondary | ICD-10-CM

## 2020-08-02 DIAGNOSIS — E559 Vitamin D deficiency, unspecified: Secondary | ICD-10-CM

## 2020-08-02 DIAGNOSIS — E538 Deficiency of other specified B group vitamins: Secondary | ICD-10-CM

## 2020-08-07 ENCOUNTER — Other Ambulatory Visit: Payer: Self-pay

## 2020-08-07 ENCOUNTER — Ambulatory Visit (INDEPENDENT_AMBULATORY_CARE_PROVIDER_SITE_OTHER): Payer: BC Managed Care – PPO | Admitting: Emergency Medicine

## 2020-08-07 ENCOUNTER — Encounter (INDEPENDENT_AMBULATORY_CARE_PROVIDER_SITE_OTHER): Payer: Self-pay | Admitting: Bariatrics

## 2020-08-07 DIAGNOSIS — J393 Upper respiratory tract hypersensitivity reaction, site unspecified: Secondary | ICD-10-CM | POA: Diagnosis not present

## 2020-08-07 NOTE — Progress Notes (Signed)
Dear Dr. Mitchel Honour,   Thank you for referring Kendra Nguyen to our clinic. The following note includes my evaluation and treatment recommendations.  Chief Complaint:   Kendra Nguyen (MR# [id number]) is a 40 y.o. female who presents for evaluation and treatment of obesity and related comorbidities. Current BMI is Body mass index is 60.85 kg/m. Kendra Nguyen has been struggling with her weight for many years and has been unsuccessful in either losing weight, maintaining weight loss, or reaching her healthy weight goal.  Kendra Nguyen is currently in the action stage of change and ready to dedicate time achieving and maintaining a healthier weight. Kendra Nguyen is interested in becoming our patient and working on intensive lifestyle modifications including (but not limited to) diet and exercise for weight loss.  Kendra Nguyen does like to cook but fatigue is an obstacle. She craves sweets and carbohydrates.  Kendra Nguyen's habits were reviewed today and are as follows: Her family eats meals together, she thinks her family will eat healthier with her, she struggles with family and or coworkers weight loss sabotage, her desired weight loss is 177 lbs, she has been heavy most of her life, she started gaining weight in 2020, her heaviest weight ever was 379.4 pounds, she has significant food cravings issues, she snacks frequently in the evenings, she skips meals frequently, she is frequently drinking liquids with calories, she frequently makes poor food choices, she frequently eats larger portions than normal, she has binge eating behaviors, and she struggles with emotional eating.  Depression Screen Kendra Nguyen's Food and Mood (modified PHQ-9) score was 21.  Depression screen PHQ 2/9 08/02/2020  Decreased Interest 2  Down, Depressed, Hopeless 3  PHQ - 2 Score 5  Altered sleeping 3  Tired, decreased energy 3  Change in appetite 2  Feeling bad or failure about yourself  2  Trouble concentrating 3  Moving  slowly or fidgety/restless 3  Suicidal thoughts 0  PHQ-9 Score 21  Difficult doing work/chores Very difficult   Subjective:   1. Other fatigue Kendra Nguyen denies daytime somnolence and admits to waking up still tired. Patent has a history of symptoms of daytime fatigue, morning fatigue, and morning headache. Gracey generally gets 6 or 7 hours of sleep per night, and states that she has poor sleep quality. Snoring is not present. Apneic episodes are not present. Epworth Sleepiness Score is 12.  2. SOB (shortness of breath) on exertion [place] notes increasing shortness of breath with exercising and seems to be worsening over time with weight gain. She notes getting out of breath sooner with activity than she used to. This has gotten worse recently. Annalaura denies shortness of breath at rest or orthopnea.  3. Vitamin D deficiency Kendra Nguyen is not taking a Vit D supplement.  4. Pure hypercholesterolemia She is taking Crestor.  5. S/P gastric bypass Kendra Nguyen has a history of gastric sleeve in 07/2014 at [facility]. Heaviest weight 379.4 lbs and lowest weight 270 lbs.  6. PCOS (polycystic ovarian syndrome) Diagnosed 1 year ago. She is taking Metformin.  7. Diabetes mellitus type 2 in obese (HCC) Kendra Nguyen's A1c was 6.5 on 05/2020. She is taking Metformin.  8. Vitamin B 12 deficiency Pt took B12 injections in the past but is not currently taking any medication.  9. At risk for activity intolerance Bret is at risk for exercise intolerance due to obesity.  Assessment/Plan:   1. Other fatigue Denim does feel that her weight is causing her energy to be lower than it should be.  Fatigue may be related to obesity, depression or many other causes. Labs will be ordered, and in the meanwhile, Kendra Nguyen will focus on self care including making healthy food choices, increasing physical activity and focusing on stress reduction. Check labs today.  - EKG 12-Lead - Vitamin B12 - Comprehensive metabolic  panel  2. SOB (shortness of breath) on exertion Kendra Nguyen notes increasing shortness of breath with exercising and seems to be worsening over time with weight gain. She notes getting out of breath sooner with activity than she used to. This has gotten worse recently. Kendra Nguyen denies shortness of breath at rest or orthopnea.  3. Vitamin D deficiency Low Vitamin D level contributes to fatigue and are associated with obesity, breast, and colon cancer. She agrees to follow-up for routine testing of Vitamin D, at least 2-3 times per year to avoid over-replacement. Check labs today.  - VITAMIN D 25 Hydroxy (Vit-D Deficiency, Fractures)  4. Pure hypercholesterolemia Continue Crestor. Cardiovascular risk and specific lipid/LDL goals reviewed.  We discussed several lifestyle modifications today and Kendra Nguyen will continue to work on diet, exercise and weight loss efforts. Orders and follow up as documented in patient record.   Counseling Intensive lifestyle modifications are the first line treatment for this issue. Dietary changes: Increase soluble fiber. Decrease simple carbohydrates. Exercise changes: Moderate to vigorous-intensity aerobic activity 150 minutes per week if tolerated. Lipid-lowering medications: see documented in medical record.  5. S/P gastric bypass Eat small frequent meals.  6. PCOS (polycystic ovarian syndrome) Continue current treatment plan. Intensive lifestyle modifications are first line treatment for this issue. We discussed several lifestyle modifications today and she will continue to work on diet, exercise and weight loss efforts. Orders and follow up as documented in patient record.  Counseling PCOS is a leading cause of menstrual irregularities and infertility. It is also associated with obesity, hirsutism (excessive hair growth on the face, chest, or back), and cardiovascular risk factors such as high cholesterol and insulin resistance. Insulin resistance appears to play  a central role.  Women with PCOS have been shown to have impaired appetite-regulating hormones. Metformin is one medication that can improve metabolic parameters.  Women with polycystic ovary syndrome (PCOS) have an increased risk for cardiovascular disease (CVD) - European Journal of Preventive Cardiology.  7. Diabetes mellitus type 2 in obese ([place]) Good blood sugar control is important to decrease the likelihood of diabetic complications such as nephropathy, neuropathy, limb loss, blindness, coronary artery disease, and death. Intensive lifestyle modification including diet, exercise and weight loss are the first line of treatment for diabetes.  Check labs today.  - Insulin, random  8. Vitamin B 12 deficiency The diagnosis was reviewed with the patient. Counseling provided today, see below. We will continue to monitor. Orders and follow up as documented in patient record.  Counseling The body needs vitamin B12: to make red blood cells; to make DNA; and to help the nerves work properly so they can carry messages from the brain to the body.  The main causes of vitamin B12 deficiency include dietary deficiency, digestive diseases, pernicious anemia, and having a surgery in which part of the stomach or small intestine is removed.  Certain medicines can make it harder for the body to absorb vitamin B12. These medicines include: heartburn medications; some antibiotics; some medications used to treat diabetes, gout, and high cholesterol.  In some cases, there are no symptoms of this condition. If the condition leads to anemia or nerve damage, various symptoms can occur, such as  weakness or fatigue, shortness of breath, and numbness or tingling in your hands and feet.   Treatment:  May include taking vitamin B12 supplements.  Avoid alcohol.  Eat lots of healthy foods that contain vitamin B12: Beef, pork, chicken, [place], and organ meats, such as liver.  Seafood: This includes clams, rainbow trout,  salmon, tuna, and haddock. Eggs.  Cereal and dairy products that are fortified: This means that vitamin B12 has been added to the food.  Check labs today.  - Vitamin B12  9. Depression screen Danaysha had a positive depression screening. Depression is commonly associated with obesity and often results in emotional eating behaviors. We will monitor this closely and work on CBT to help improve the non-hunger eating patterns. Referral to Psychology may be required if no improvement is seen as she continues in our clinic.  10. At risk for activity intolerance Kendra Nguyen was given approximately 15 minutes of exercise intolerance counseling today. She is 40 y.o. female and has risk factors exercise intolerance including obesity. We discussed intensive lifestyle modifications today with an emphasis on specific weight loss instructions and strategies. Kendra Nguyen will slowly increase activity as tolerated.  Repetitive spaced learning was employed today to elicit superior memory formation and behavioral change.  11. Class 3 severe obesity with serious comorbidity and body mass index (BMI) of 60.0 to 69.9 in adult, unspecified obesity type (HCC)  Kendra Nguyen is currently in the action stage of change and her goal is to continue with weight loss efforts. I recommend Raevyn begin the structured treatment plan as follows:  She has agreed to the Category 2 Plan + 100 calories.  Meal planning Intentional eating 05/24/2020 labs reviewed Will minimize meal sizes. No regular soda  Exercise goals: No exercise has been prescribed at this time.   Behavioral modification strategies: increasing lean protein intake, decreasing simple carbohydrates, increasing vegetables, increasing water intake, decreasing eating out, no skipping meals, meal planning and cooking strategies, keeping healthy foods in the home, and planning for success.  She was informed of the importance of frequent follow-up visits to maximize her success  with intensive lifestyle modifications for her multiple health conditions. She was informed we would discuss her lab results at her next visit unless there is a critical issue that needs to be addressed sooner. Kendra Nguyen agreed to keep her next visit at the agreed upon time to discuss these results.  Objective:   Blood pressure 131/77, pulse 75, temperature 98.5 F (36.9 C), height 5\' 6"  (1.676 m), weight (!) 377 lb (171 kg), last menstrual period 07/14/2020, SpO2 97 %. Body mass index is 60.85 kg/m.  EKG: Normal sinus rhythm, rate 79.  Indirect Calorimeter completed today shows a VO2 of 276 and a REE of 1918.  Her calculated basal metabolic rate is 4765 thus her basal metabolic rate is worse than expected.  General: Cooperative, alert, well developed, in no acute distress. HEENT: Conjunctivae and lids unremarkable. Cardiovascular: Regular rhythm.  Lungs: Normal work of breathing. Neurologic: No focal deficits.   Lab Results  Component Value Date   CREATININE 0.58 05/24/2020   BUN 7 05/24/2020   NA 138 05/24/2020   K 3.5 05/24/2020   CL 105 05/24/2020   CO2 27 05/24/2020   Lab Results  Component Value Date   ALT 11 05/24/2020   AST 13 05/24/2020   ALKPHOS 70 05/24/2020   BILITOT 0.8 05/24/2020   Lab Results  Component Value Date   HGBA1C 6.5 05/24/2020   HGBA1C 5.5 08/26/2017  HGBA1C 5.4 12/18/2014   HGBA1C 5.4 12/18/2014   HGBA1C 5.6 06/06/2014   No results found for: INSULIN Lab Results  Component Value Date   TSH 1.67 05/24/2020   Lab Results  Component Value Date   CHOL 219 (H) 05/24/2020   HDL 50.70 05/24/2020   LDLCALC 152 (H) 05/24/2020   TRIG 82.0 05/24/2020   CHOLHDL 4 05/24/2020   Lab Results  Component Value Date   WBC 6.4 05/24/2020   HGB 12.6 05/24/2020   HCT 36.9 05/24/2020   MCV 84.3 05/24/2020   PLT 273.0 05/24/2020    Attestation Statements:   Reviewed by clinician on day of visit: allergies, medications, problem list, medical history,  surgical history, family history, social history, and previous encounter notes.  Coral Ceo, CMA, am acting as Location manager for [employer], DO.  I have reviewed the above documentation for accuracy and completeness, and I agree with the above. Jearld Lesch, DO

## 2020-08-07 NOTE — Progress Notes (Signed)
Telemedicine Encounter- SOAP NOTE Established Patient Patient: Home  Provider: Office   Patient present only  This telephone encounter was conducted with the patient's (or proxy's) verbal consent via audio telecommunications: yes/no: Yes Patient was instructed to have this encounter in a suitably private space; and to only have persons present to whom they give permission to participate. In addition, patient identity was confirmed by use of name plus two identifiers (DOB and address).  I discussed the limitations, risks, security and privacy concerns of performing an evaluation and management service by telephone and the availability of in person appointments. I also discussed with the patient that there may be a patient responsible charge related to this service. The patient expressed understanding and agreed to proceed.  I spent a total of TIME; 0 MIN TO 60 MIN: 20 minutes talking with the patient or their proxy.  Chief complaint: Allergy symptoms  Subjective   Kendra Nguyen is a 40 y.o. female established patient. Telephone visit today complaining of upper respiratory symptoms including headache, cough, runny nose, and sinus congestion for several weeks.  Symptoms get worse when inside a certain building at work.  Symptoms get better at home or when she goes to a different location.  Denies fever or chills, denies difficulty breathing or wheezing. No other significant symptoms. No other complaints or medical concerns today.  HPI   Patient Active Problem List   Diagnosis Date Noted   Left leg swelling 05/24/2020   History of PCOS 05/24/2020   Prediabetes 05/24/2020   S/P gastric bypass 12/18/2014   Hereditary and idiopathic peripheral neuropathy 06/07/2014   Pure hypercholesterolemia 12/15/2013   Vitamin D deficiency 12/15/2013   Morbid obesity ([place]) 03/21/2011    Past Medical History:  Diagnosis Date   Anemia    Anxiety    Depression    Diabetes ([place])    Edema of  both lower extremities    Hx of laparoscopic gastric banding    Hyperlipemia    Hypertension    was taken of HTN meds by PCP "years ago"   Morbid obesity with BMI of 45.0-49.9, adult ([place])    Obesity    OCD (obsessive compulsive disorder) 04/2016   PCOS (polycystic ovarian syndrome)    Varicose veins    Vitamin D deficiency     Current Outpatient Medications  Medication Sig Dispense Refill   ARIPiprazole (ABILIFY) 5 MG tablet Take 5 mg by mouth.     FLUoxetine HCl 60 MG TABS Take 60 mg by mouth daily.     metFORMIN (GLUCOPHAGE) 1000 MG tablet Take 1,000 mg by mouth in the morning and at bedtime.     No current facility-administered medications for this visit.    Allergies  Allergen Reactions   Tape     rash   Neomycin Rash   Other Itching and Rash    Social History   Socioeconomic History   Marital status: Married    Spouse name: Not on file   Number of children: Not on file   Years of education: Master's   Highest education level: Not on file  Occupational History   Occupation: Pharmacist, hospital- middle school  Tobacco Use   Smoking status: Never   Smokeless tobacco: Never  Vaping Use   Vaping Use: Never used  Substance and Sexual Activity   Alcohol use: Yes    Comment: occ   Drug use: No   Sexual activity: Not on file  Other Topics Concern   Not on  file  Social History Narrative   Lives at home by herself.   Caffeine use: Soda occass [name])   Engaged   Social Determinants of Health   Financial Resource Strain: Not on file  Food Insecurity: Not on file  Transportation Needs: Not on file  Physical Activity: Not on file  Stress: Not on file  Social Connections: Not on file  Intimate Partner Violence: Not on file    Review of Systems  Constitutional: Negative.  Negative for chills and fever.  HENT:  Positive for congestion.   Eyes:  Negative for discharge and redness.  Respiratory:  Positive for cough.   Cardiovascular:  Negative for chest pain and  palpitations.  Gastrointestinal:  Negative for abdominal pain, diarrhea, nausea and vomiting.  Genitourinary: Negative.   Skin: Negative.  Negative for rash.  Neurological:  Positive for headaches.  All other systems reviewed and are negative.  Objective  Alert and oriented x3 in no apparent respiratory distress Vitals as reported by the patient: There were no vitals filed for this visit.  Diagnoses and all orders for this visit:  Hypersensitivity disorder of respiratory tract  Clinically stable.  However symptoms triggered by exposure to unknown allergen at a very specific place of work. Symptoms get much better when removed from disability. Recommend to change place of work to avoid dangerous exposure. Work note provided.   I discussed the assessment and treatment plan with the patient. The patient was provided an opportunity to ask questions and all were answered. The patient agreed with the plan and demonstrated an understanding of the instructions.   The patient was advised to call back or seek an in-person evaluation if the symptoms worsen or if the condition fails to improve as anticipated.  I provided 20 minutes of non-face-to-face time during this encounter.  Horald Pollen, MD  Primary Care at [facility]

## 2020-08-15 ENCOUNTER — Encounter (INDEPENDENT_AMBULATORY_CARE_PROVIDER_SITE_OTHER): Payer: Self-pay

## 2020-08-16 ENCOUNTER — Other Ambulatory Visit: Payer: Self-pay

## 2020-08-16 ENCOUNTER — Ambulatory Visit (INDEPENDENT_AMBULATORY_CARE_PROVIDER_SITE_OTHER): Payer: BC Managed Care – PPO | Admitting: Bariatrics

## 2020-08-16 ENCOUNTER — Encounter (INDEPENDENT_AMBULATORY_CARE_PROVIDER_SITE_OTHER): Payer: Self-pay | Admitting: Bariatrics

## 2020-08-16 VITALS — BP 117/89 | HR 101 | Temp 97.3°F | Ht 66.0 in | Wt 380.0 lb

## 2020-08-16 DIAGNOSIS — Z9884 Bariatric surgery status: Secondary | ICD-10-CM

## 2020-08-16 DIAGNOSIS — E669 Obesity, unspecified: Secondary | ICD-10-CM | POA: Diagnosis not present

## 2020-08-16 DIAGNOSIS — E8881 Metabolic syndrome: Secondary | ICD-10-CM

## 2020-08-16 DIAGNOSIS — E1169 Type 2 diabetes mellitus with other specified complication: Secondary | ICD-10-CM | POA: Diagnosis not present

## 2020-08-16 DIAGNOSIS — Z6841 Body Mass Index (BMI) 40.0 and over, adult: Secondary | ICD-10-CM

## 2020-08-16 DIAGNOSIS — K219 Gastro-esophageal reflux disease without esophagitis: Secondary | ICD-10-CM

## 2020-08-21 ENCOUNTER — Encounter (INDEPENDENT_AMBULATORY_CARE_PROVIDER_SITE_OTHER): Payer: Self-pay | Admitting: Bariatrics

## 2020-08-21 NOTE — Progress Notes (Signed)
Chief Complaint:   OBESITY Kendra Nguyen is here to discuss her progress with her obesity treatment plan along with follow-up of her obesity related diagnoses. Kendra Nguyen is on the Category 2 Plan and states she is following her eating plan approximately 0% of the time. Kendra Nguyen states she is doing cardio and cycling for 30-45 minutes 2-3 times per week.  Today's visit was #: 3 Starting weight: 377 lbs Starting date: 08/02/2020 Today's weight: 380 lbs Today's date:08/16/2020 Total lbs lost to date: 0 Total lbs lost since last in-office visit: 0  Interim History: Kendra Nguyen is up 3 lbs. She states that she did not start the plan due to not shopping. She is drinking more H2O.  Subjective:   1. Diabetes mellitus type 2 in obese (HCC) Kendra Nguyen is taking Metformin. Her last A1C level was 6.5.  2. S/P gastric bypass Kendra Nguyen has some restrictions.  Assessment/Plan:   1. Diabetes mellitus type 2 in obese [facility]) Tre[place]is will continue her medication. She will decrease sweets and starchy foods. Good blood sugar control is important to decrease the likelihood of diabetic complications such as nephropathy, neuropathy, limb loss, blindness, coronary artery disease, and death. Intensive lifestyle modification including diet, exercise and weight loss are the first line of treatment for diabetes.   2. S/P gastric bypass Kendra Nguyen will increase protein.  3. Obesity, current BMI 61.4 Kendra Nguyen is currently in the action stage of change. As such, her goal is to continue with weight loss efforts. She has agreed to the Category 2 Plan.   Kendra Nguyen will continue meal plan. She will be mindful eating. She will go to the store and shop. She will increase H2O and protein.  Exercise goals:  Walking and [place].  Behavioral modification strategies: increasing lean protein intake, decreasing simple carbohydrates, increasing vegetables, increasing water intake, decreasing eating out, no skipping meals, meal planning and  cooking strategies, keeping healthy foods in the home, and planning for success.  Kendra Nguyen has agreed to follow-up with our clinic in 2 weeks (fasting labs). She was informed of the importance of frequent follow-up visits to maximize her success with intensive lifestyle modifications for her multiple health conditions.   Objective:   Blood pressure 117/89, pulse (!) 101, temperature (!) 97.3 F (36.3 C), height '5\' 6"'$  (1.676 m), weight (!) 380 lb (172.4 kg), SpO2 98 %. Body mass index is 61.33 kg/m.  General: Cooperative, alert, well developed, in no acute distress. HEENT: Conjunctivae and lids unremarkable. Cardiovascular: Regular rhythm.  Lungs: Normal work of breathing. Neurologic: No focal deficits.   Lab Results  Component Value Date   CREATININE 0.58 05/24/2020   BUN 7 05/24/2020   NA 138 05/24/2020   K 3.5 05/24/2020   CL 105 05/24/2020   CO2 27 05/24/2020   Lab Results  Component Value Date   ALT 11 05/24/2020   AST 13 05/24/2020   ALKPHOS 70 05/24/2020   BILITOT 0.8 05/24/2020   Lab Results  Component Value Date   HGBA1C 6.5 05/24/2020   HGBA1C 5.5 08/26/2017   HGBA1C 5.4 12/18/2014   HGBA1C 5.4 12/18/2014   HGBA1C 5.6 06/06/2014   No results found for: INSULIN Lab Results  Component Value Date   TSH 1.67 05/24/2020   Lab Results  Component Value Date   CHOL 219 (H) 05/24/2020   HDL 50.70 05/24/2020   LDLCALC 152 (H) 05/24/2020   TRIG 82.0 05/24/2020   CHOLHDL 4 05/24/2020   Lab Results  Component Value Date   VD25OH 18 (  L) 04/25/2014   VD25OH 16 (L) 12/15/2013   Lab Results  Component Value Date   WBC 6.4 05/24/2020   HGB 12.6 05/24/2020   HCT 36.9 05/24/2020   MCV 84.3 05/24/2020   PLT 273.0 05/24/2020   No results found for: IRON, TIBC, FERRITIN  Attestation Statements:   Reviewed by clinician on day of visit: allergies, medications, problem list, medical history, surgical history, family history, social history, and previous encounter  notes.  Time spent on visit including pre-visit chart review and post-visit care and charting was 30 minutes.   I, Lizbeth Bark, RMA, am acting as Location manager for [employer], DO.   I have reviewed the above documentation for accuracy and completeness, and I agree with the above. Jearld Lesch, DO

## 2020-09-04 ENCOUNTER — Ambulatory Visit (INDEPENDENT_AMBULATORY_CARE_PROVIDER_SITE_OTHER): Payer: BC Managed Care – PPO | Admitting: Bariatrics

## 2020-09-07 ENCOUNTER — Encounter (INDEPENDENT_AMBULATORY_CARE_PROVIDER_SITE_OTHER): Payer: Self-pay

## 2020-09-11 ENCOUNTER — Telehealth (INDEPENDENT_AMBULATORY_CARE_PROVIDER_SITE_OTHER): Payer: BC Managed Care – PPO | Admitting: Bariatrics

## 2020-09-11 DIAGNOSIS — E669 Obesity, unspecified: Secondary | ICD-10-CM | POA: Diagnosis not present

## 2020-09-11 DIAGNOSIS — E7800 Pure hypercholesterolemia, unspecified: Secondary | ICD-10-CM

## 2020-09-11 DIAGNOSIS — Z6841 Body Mass Index (BMI) 40.0 and over, adult: Secondary | ICD-10-CM

## 2020-09-11 DIAGNOSIS — E1169 Type 2 diabetes mellitus with other specified complication: Secondary | ICD-10-CM

## 2020-09-12 NOTE — Progress Notes (Signed)
TeleHealth Visit:  Due to the COVID-19 pandemic, this visit was completed with telemedicine (audio/video) technology to reduce patient and provider exposure as well as to preserve personal protective equipment.   Kendra Nguyen has verbally consented to this TeleHealth visit. The patient is located at home, the provider is located at the [facility] office. The participants in this visit include the listed provider and patient. The visit was conducted today via video.  Chief Complaint: OBESITY Kendra Nguyen is here to discuss her progress with her obesity treatment plan along with follow-up of her obesity related diagnoses. Kendra Nguyen is on the Category 2 Plan and states she is following her eating plan approximately 30% of the time. Kendra Nguyen states she is walking for 15-20 minutes 2 times per week.  Today's visit was #: 4 Starting weight: 377 lbs Starting date: 08/02/2020  Interim History: Kendra Nguyen states that she has Covid 9 and requests a video visit. She states that she has gained 2-3 lbs.  Subjective:   1. Pure hypercholesterolemia Kendra Nguyen is currently not on medicaiton.  2. Diabetes mellitus type 2 in obese (HCC) Kendra Nguyen is currently taking Metformin.  Assessment/Plan:   1. Pure hypercholesterolemia Kendra Nguyen will continue with 0 trans fats. She will increase ratio of Omega 3 to Omega 6.  2. Diabetes mellitus type 2 in obese (HCC) Kendra Nguyen will continue Metformin. We discussed Mounjaro (side effects, risks, and benefits. ). She is taking Ozempic at this time, but will switch to [facility] at the next visit if patient desires. Discussed that  good blood sugar control is important to decrease the likelihood of diabetic complications such as nephropathy, neuropathy, limb loss, blindness, coronary artery disease, and death. Intensive lifestyle modification including diet, exercise and weight loss are the first line of treatment for diabetes.    3. Obesity, current BMI 61.4 Kendra Nguyen is  currently in the action stage of change. As such, her goal is to continue with weight loss efforts. She has agreed to the Category 2 Plan.   Kendra Nguyen will adhere to the plan (80-85%). She will increase protein and H2O.  Exercise goals: As is. Kendra Nguyen will resume walking when she is feeling better.  Behavioral modification strategies: increasing lean protein intake, decreasing simple carbohydrates, increasing vegetables, increasing water intake, decreasing eating out, no skipping meals, meal planning and cooking strategies, keeping healthy foods in the home, and planning for success.  Kendra Nguyen has agreed to follow-up with our clinic in 2-3 weeks. She was informed of the importance of frequent follow-up visits to maximize her success with intensive lifestyle modifications for her multiple health conditions.  Objective:   VITALS: Per patient if applicable, see vitals. GENERAL: Alert and in no acute distress. CARDIOPULMONARY: No increased WOB. Speaking in clear sentences.  PSYCH: Pleasant and cooperative. Speech normal rate and rhythm. Affect is appropriate. Insight and judgement are appropriate. Attention is focused, linear, and appropriate.  NEURO: Oriented as arrived to appointment on time with no prompting.   Lab Results  Component Value Date   CREATININE 0.58 05/24/2020   BUN 7 05/24/2020   NA 138 05/24/2020   K 3.5 05/24/2020   CL 105 05/24/2020   CO2 27 05/24/2020   Lab Results  Component Value Date   ALT 11 05/24/2020   AST 13 05/24/2020   ALKPHOS 70 05/24/2020   BILITOT 0.8 05/24/2020   Lab Results  Component Value Date   HGBA1C 6.5 05/24/2020   HGBA1C 5.5 08/26/2017   HGBA1C 5.4 12/18/2014   HGBA1C 5.4 12/18/2014  HGBA1C 5.6 06/06/2014   No results found for: INSULIN Lab Results  Component Value Date   TSH 1.67 05/24/2020   Lab Results  Component Value Date   CHOL 219 (H) 05/24/2020   HDL 50.70 05/24/2020   LDLCALC 152 (H) 05/24/2020   TRIG 82.0 05/24/2020    CHOLHDL 4 05/24/2020   Lab Results  Component Value Date   VD25OH 18 (L) 04/25/2014   VD25OH 16 (L) 12/15/2013   Lab Results  Component Value Date   WBC 6.4 05/24/2020   HGB 12.6 05/24/2020   HCT 36.9 05/24/2020   MCV 84.3 05/24/2020   PLT 273.0 05/24/2020   No results found for: IRON, TIBC, FERRITIN  Attestation Statements:   Reviewed by clinician on day of visit: allergies, medications, problem list, medical history, surgical history, family history, social history, and previous encounter notes.  Time spent on visit including pre-visit chart review and post-visit charting and care was 20 minutes.   I, Lizbeth Bark, RMA, am acting as Location manager for [employer], DO.   I have reviewed the above documentation for accuracy and completeness, and I agree with the above. Jearld Lesch, DO

## 2020-09-13 ENCOUNTER — Encounter (INDEPENDENT_AMBULATORY_CARE_PROVIDER_SITE_OTHER): Payer: Self-pay | Admitting: Bariatrics

## 2020-10-05 ENCOUNTER — Encounter (INDEPENDENT_AMBULATORY_CARE_PROVIDER_SITE_OTHER): Payer: Self-pay

## 2020-10-05 ENCOUNTER — Ambulatory Visit (INDEPENDENT_AMBULATORY_CARE_PROVIDER_SITE_OTHER): Payer: BC Managed Care – PPO | Admitting: Bariatrics

## 2020-10-31 ENCOUNTER — Ambulatory Visit: Payer: BC Managed Care – PPO | Admitting: Emergency Medicine

## 2020-10-31 ENCOUNTER — Other Ambulatory Visit: Payer: Self-pay

## 2020-10-31 ENCOUNTER — Encounter: Payer: Self-pay | Admitting: Emergency Medicine

## 2020-10-31 VITALS — BP 130/82 | HR 79 | Temp 98.3°F | Ht 66.0 in | Wt 379.0 lb

## 2020-10-31 DIAGNOSIS — Z23 Encounter for immunization: Secondary | ICD-10-CM

## 2020-10-31 DIAGNOSIS — E1165 Type 2 diabetes mellitus with hyperglycemia: Secondary | ICD-10-CM | POA: Diagnosis not present

## 2020-10-31 MED ORDER — RYBELSUS 7 MG PO TABS
7.0000 mg | ORAL_TABLET | Freq: Every day | ORAL | 3 refills | Status: AC
Start: 2020-10-31 — End: 2021-01-29

## 2020-10-31 MED ORDER — ROSUVASTATIN CALCIUM 10 MG PO TABS: 10.0000 mg | ORAL_TABLET | Freq: Every day | ORAL | 2 refills | Status: DC

## 2020-10-31 NOTE — Patient Instructions (Signed)
Diabetes Mellitus and Nutrition, Adult When you have diabetes, or diabetes mellitus, it is very important to have healthy eating habits because your blood sugar (glucose) levels are greatly affected by what you eat and drink. Eating healthy foods in the right amounts, at about the same times every day, can help you:  Control your blood glucose.  Lower your risk of heart disease.  Improve your blood pressure.  Reach or maintain a healthy weight. What can affect my meal plan? Every person with diabetes is different, and each person has different needs for a meal plan. Your health care provider may recommend that you work with a dietitian to make a meal plan that is best for you. Your meal plan may vary depending on factors such as:  The calories you need.  The medicines you take.  Your weight.  Your blood glucose, blood pressure, and cholesterol levels.  Your activity level.  Other health conditions you have, such as heart or kidney disease. How do carbohydrates affect me? Carbohydrates, also called carbs, affect your blood glucose level more than any other type of food. Eating carbs naturally raises the amount of glucose in your blood. Carb counting is a method for keeping track of how many carbs you eat. Counting carbs is important to keep your blood glucose at a healthy level, especially if you use insulin or take certain oral diabetes medicines. It is important to know how many carbs you can safely have in each meal. This is different for every person. Your dietitian can help you calculate how many carbs you should have at each meal and for each snack. How does alcohol affect me? Alcohol can cause a sudden decrease in blood glucose (hypoglycemia), especially if you use insulin or take certain oral diabetes medicines. Hypoglycemia can be a life-threatening condition. Symptoms of hypoglycemia, such as sleepiness, dizziness, and confusion, are similar to symptoms of having too much  alcohol.  Do not drink alcohol if: ? Your health care provider tells you not to drink. ? You are pregnant, may be pregnant, or are planning to become pregnant.  If you drink alcohol: ? Do not drink on an empty stomach. ? Limit how much you use to:  0-1 drink a day for women.  0-2 drinks a day for men. ? Be aware of how much alcohol is in your drink. In the [place] one drink equals one 12 oz bottle of beer (355 mL), one 5 oz glass of wine (148 mL), or one 1 oz glass of hard liquor (44 mL). ? Keep yourself hydrated with water, diet soda, or unsweetened iced tea.  Keep in mind that regular soda, juice, and other mixers may contain a lot of sugar and must be counted as carbs. What are tips for following this plan? Reading food labels  Start by checking the serving size on the "Nutrition Facts" label of packaged foods and drinks. The amount of calories, carbs, fats, and other nutrients listed on the label is based on one serving of the item. Many items contain more than one serving per package.  Check the total grams (g) of carbs in one serving. You can calculate the number of servings of carbs in one serving by dividing the total carbs by 15. For example, if a food has 30 g of total carbs per serving, it would be equal to 2 servings of carbs.  Check the number of grams (g) of saturated fats and trans fats in one serving. Choose foods that have   a low amount or none of these fats.  Check the number of milligrams (mg) of salt (sodium) in one serving. Most people should limit total sodium intake to less than 2,300 mg per day.  Always check the nutrition information of foods labeled as "low-fat" or "nonfat." These foods may be higher in added sugar or refined carbs and should be avoided.  Talk to your dietitian to identify your daily goals for nutrients listed on the label. Shopping  Avoid buying canned, pre-made, or processed foods. These foods tend to be high in fat, sodium, and added  sugar.  Shop around the outside edge of the grocery store. This is where you will most often find fresh fruits and vegetables, bulk grains, fresh meats, and fresh dairy. Cooking  Use low-heat cooking methods, such as baking, instead of high-heat cooking methods like deep frying.  Cook using healthy oils, such as olive, canola, or sunflower oil.  Avoid cooking with butter, cream, or high-fat meats. Meal planning  Eat meals and snacks regularly, preferably at the same times every day. Avoid going long periods of time without eating.  Eat foods that are high in fiber, such as fresh fruits, vegetables, beans, and whole grains. Talk with your dietitian about how many servings of carbs you can eat at each meal.  Eat 4-6 oz (112-168 g) of lean protein each day, such as lean meat, chicken, fish, eggs, or tofu. One ounce (oz) of lean protein is equal to: ? 1 oz (28 g) of meat, chicken, or fish. ? 1 egg. ?  cup (62 g) of tofu.  Eat some foods each day that contain healthy fats, such as avocado, nuts, seeds, and fish.   What foods should I eat? Fruits Berries. Apples. Oranges. Peaches. Apricots. Plums. Grapes. Mango. Papaya. Pomegranate. Kiwi. Cherries. Vegetables Lettuce. Spinach. Leafy greens, including kale, chard, collard greens, and mustard greens. Beets. Cauliflower. Cabbage. Broccoli. Carrots. Green beans. Tomatoes. Peppers. Onions. Cucumbers. Brussels sprouts. Grains Whole grains, such as whole-wheat or whole-grain bread, crackers, tortillas, cereal, and pasta. Unsweetened oatmeal. Quinoa. Brown or wild rice. Meats and other proteins Seafood. Poultry without skin. Lean cuts of poultry and beef. Tofu. Nuts. Seeds. Dairy Low-fat or fat-free dairy products such as milk, yogurt, and cheese. The items listed above may not be a complete list of foods and beverages you can eat. Contact a dietitian for more information. What foods should I avoid? Fruits Fruits canned with  syrup. Vegetables Canned vegetables. Frozen vegetables with butter or cream sauce. Grains Refined white flour and flour products such as bread, pasta, snack foods, and cereals. Avoid all processed foods. Meats and other proteins Fatty cuts of meat. Poultry with skin. Breaded or fried meats. Processed meat. Avoid saturated fats. Dairy Full-fat yogurt, cheese, or milk. Beverages Sweetened drinks, such as soda or iced tea. The items listed above may not be a complete list of foods and beverages you should avoid. Contact a dietitian for more information. Questions to ask a health care provider  Do I need to meet with a diabetes educator?  Do I need to meet with a dietitian?  What number can I call if I have questions?  When are the best times to check my blood glucose? Where to find more information:  American Diabetes Association: diabetes.org  Academy of Nutrition and Dietetics: www.eatright.org  National Institute of Diabetes and Digestive and Kidney Diseases: www.niddk.nih.gov  Association of Diabetes Care and Education Specialists: www.diabeteseducator.org Summary  It is important to have healthy eating   habits because your blood sugar (glucose) levels are greatly affected by what you eat and drink.  A healthy meal plan will help you control your blood glucose and maintain a healthy lifestyle.  Your health care provider may recommend that you work with a dietitian to make a meal plan that is best for you.  Keep in mind that carbohydrates (carbs) and alcohol have immediate effects on your blood glucose levels. It is important to count carbs and to use alcohol carefully. This information is not intended to replace advice given to you by your health care provider. Make sure you discuss any questions you have with your health care provider. Document Revised: 12/15/2018 Document Reviewed: 12/15/2018 Elsevier Patient Education  2021 [employer] Inc[employer]  

## 2020-10-31 NOTE — Assessment & Plan Note (Signed)
Has been inconsistent with medications.  Takes metformin erratically and discontinued Ozempic. Last A1c was 6.5.  We will start Rybelsus 7 mg daily. Diet and nutrition discussed. Follow-up in 3 months.

## 2020-10-31 NOTE — Progress Notes (Signed)
Kendra Nguyen 40 y.o.   Chief Complaint  Patient presents with   Medication Management    Pt would like to discuss ozempic medication, she states that she may want to try a pill form.    HISTORY OF PRESENT ILLNESS: This is a 40 y.o. female here for 60-month follow-up of diabetes and obesity. Still taking metformin.  Stopped Ozempic.  Does not like injectables. Went to weight management center but stopped going.  Using Herbalite diet which is working for her. No other complaints or medical concerns today.  HPI   Prior to Admission medications   Medication Sig Start Date End Date Taking? Authorizing Provider  ARIPiprazole (ABILIFY) 5 MG tablet Take 5 mg by mouth. 05/15/20  Yes [provider]  atomoxetine (STRATTERA) 40 MG capsule Take 40 mg by mouth every morning. 10/27/20  Yes [provider]  FLUoxetine HCl 60 MG TABS Take 60 mg by mouth daily. 07/28/19  Yes [provider]  metFORMIN (GLUCOPHAGE) 1000 MG tablet Take 1,000 mg by mouth in the morning and at bedtime. 05/23/20  Yes [provider]  rosuvastatin (CRESTOR) 10 MG tablet Take 10 mg by mouth at bedtime. 09/07/20  Yes [provider]    Allergies  Allergen Reactions   Tape     rash   Neomycin Rash   Other Itching and Rash    Patient Active Problem List   Diagnosis Date Noted   Left leg swelling 05/24/2020   History of PCOS 05/24/2020   Prediabetes 05/24/2020   S/P gastric bypass 12/18/2014   Hereditary and idiopathic peripheral neuropathy 06/07/2014   Pure hypercholesterolemia 12/15/2013   Vitamin D deficiency 12/15/2013   Morbid obesity ([place]) 03/21/2011    Past Medical History:  Diagnosis Date   Anemia    Anxiety    Depression    Diabetes ([place])    Edema of both lower extremities    Hx of laparoscopic gastric banding    Hyperlipemia    Hypertension    was taken of HTN meds by PCP "years ago"   Morbid obesity with BMI of 45.0-49.9, adult ([place])    Obesity     OCD (obsessive compulsive disorder) 04/2016   PCOS (polycystic ovarian syndrome)    Varicose veins    Vitamin D deficiency     Past Surgical History:  Procedure Laterality Date   CESAREAN SECTION N/A 01/18/2016   Procedure: CESAREAN SECTION;  Surgeon: Everett Graff, MD;  Location: [facility];  Service: Obstetrics;  Laterality: N/A;   CESAREAN SECTION N/A    Phreesia 01/28/2020   COLONOSCOPY WITH PROPOFOL N/A 11/24/2014   Procedure: COLONOSCOPY WITH PROPOFOL;  Surgeon: Milus Banister, MD;  Location: [facility];  Service: Endoscopy;  Laterality: N/A;   LAPAROSCOPIC GASTRIC BANDING  04/17/2011   LAPAROSCOPIC GASTRIC SLEEVE RESECTION     2016   WISDOM TOOTH EXTRACTION      Social History   Socioeconomic History   Marital status: Married    Spouse name: Not on file   Number of children: Not on file   Years of education: Master's   Highest education level: Master's degree (e.g., MA, MS, MEng, MEd, MSW, MBA)  Occupational History   Occupation: Pharmacist, hospital- middle school  Tobacco Use   Smoking status: Never   Smokeless tobacco: Never  Vaping Use   Vaping Use: Never used  Substance and Sexual Activity   Alcohol use: Yes    Comment: occ   Drug use: No   Sexual  activity: Not on file  Other Topics Concern   Not on file  Social History Narrative   Lives at home by herself.   Caffeine use: Soda occass [name])   Engaged   Social Determinants of Health   Financial Resource Strain: Low Risk    Difficulty of Paying Living Expenses: Not very hard  Food Insecurity: No Food Insecurity   Worried About Charity fundraiser in the Last Year: Never true   Arboriculturist in the Last Year: Never true  Transportation Needs: No Transportation Needs   Lack of Transportation (Medical): No   Lack of Transportation (Non-Medical): No  Physical Activity: Insufficiently Active   Days of Exercise per Week: 2 days   Minutes of Exercise per Session: 30 min  Stress: Stress Concern Present    Feeling of Stress : To some extent  Social Connections: Moderately Integrated   Frequency of Communication with Friends and Family: Once a week   Frequency of Social Gatherings with Friends and Family: Once a week   Attends Religious Services: More than 4 times per year   Active Member of [employer] or Organizations: Yes   Attends Music therapist: More than 4 times per year   Marital Status: Married  Human resources officer Violence: Not on file    Family History  Problem Relation Age of Onset   Diabetes Mother    Heart disease Mother    Obesity Mother    Arthritis Father        gout   COPD Father        smoker   Hyperlipidemia Father    Cancer Father        stomach   High blood pressure Father    Sleep apnea Father    Aneurysm Sister 14       cerebral;    Heart disease Maternal Grandmother    Heart disease Maternal Grandfather    COPD Paternal Aunt    Neuropathy Neg Hx      Review of Systems  Constitutional: Negative.  Negative for chills and fever.  HENT: Negative.  Negative for congestion and sore throat.   Respiratory: Negative.  Negative for cough and shortness of breath.   Cardiovascular: Negative.  Negative for chest pain and palpitations.  Gastrointestinal: Negative.  Negative for abdominal pain, diarrhea, nausea and vomiting.  Genitourinary: Negative.   Skin: Negative.  Negative for rash.  Neurological: Negative.  Negative for dizziness and headaches.  All other systems reviewed and are negative.   Physical Exam Vitals reviewed.  Constitutional:      Appearance: She is obese.  HENT:     Head: Normocephalic.  Eyes:     Extraocular Movements: Extraocular movements intact.     Pupils: Pupils are equal, round, and reactive to light.  Cardiovascular:     Rate and Rhythm: Normal rate and regular rhythm.     Pulses: Normal pulses.     Heart sounds: Normal heart sounds.  Pulmonary:     Effort: Pulmonary effort is normal.     Breath sounds: Normal breath  sounds.  Musculoskeletal:     Cervical back: No tenderness.  Skin:    General: Skin is warm and dry.     Capillary Refill: Capillary refill takes less than 2 seconds.  Neurological:     General: No focal deficit present.     Mental Status: She is alert and oriented to person, place, and time.  Psychiatric:  Mood and Affect: Mood normal.        Behavior: Behavior normal.     ASSESSMENT & PLAN: Problem List Items Addressed This Visit       Endocrine   Type 2 diabetes mellitus with hyperglycemia, without long-term current use of insulin ([place]) - Primary    Has been inconsistent with medications.  Takes metformin erratically and discontinued Ozempic. Last A1c was 6.5.  We will start Rybelsus 7 mg daily. Diet and nutrition discussed. Follow-up in 3 months.      Relevant Medications   Semaglutide (RYBELSUS) 7 MG TABS     Other   Morbid obesity (HCC)   Relevant Medications   atomoxetine (STRATTERA) 40 MG capsule   Semaglutide (RYBELSUS) 7 MG TABS   Other Visit Diagnoses     Need for influenza vaccination       Relevant Orders   Flu Vaccine QUAD 83mo+IM (Fluarix, Fluzone & Alfiuria Quad PF) (Completed)      Patient Instructions  Diabetes Mellitus and Nutrition, Adult When you have diabetes, or diabetes mellitus, it is very important to have healthy eating habits because your blood sugar (glucose) levels are greatly affected by what you eat and drink. Eating healthy foods in the right amounts, at about the same times every day, can help you: Control your blood glucose. Lower your risk of heart disease. Improve your blood pressure. Reach or maintain a healthy weight. What can affect my meal plan? Every person with diabetes is different, and each person has different needs for a meal plan. Your health care provider may recommend that you work with a dietitian to make a meal plan that is best for you. Your meal plan may vary depending on factors such as: The calories you  need. The medicines you take. Your weight. Your blood glucose, blood pressure, and cholesterol levels. Your activity level. Other health conditions you have, such as heart or kidney disease. How do carbohydrates affect me? Carbohydrates, also called carbs, affect your blood glucose level more than any other type of food. Eating carbs naturally raises the amount of glucose in your blood. Carb counting is a method for keeping track of how many carbs you eat. Counting carbs is important to keep your blood glucose at a healthy level, especially if you use insulin or take certain oral diabetes medicines. It is important to know how many carbs you can safely have in each meal. This is different for every person. Your dietitian can help you calculate how many carbs you should have at each meal and for each snack. How does alcohol affect me? Alcohol can cause a sudden decrease in blood glucose (hypoglycemia), especially if you use insulin or take certain oral diabetes medicines. Hypoglycemia can be a life-threatening condition. Symptoms of hypoglycemia, such as sleepiness, dizziness, and confusion, are similar to symptoms of having too much alcohol. Do not drink alcohol if: Your health care provider tells you not to drink. You are pregnant, may be pregnant, or are planning to become pregnant. If you drink alcohol: Do not drink on an empty stomach. Limit how much you use to: 0-1 drink a day for women. 0-2 drinks a day for men. Be aware of how much alcohol is in your drink. In the [place] one drink equals one 12 oz bottle of beer (355 mL), one 5 oz glass of wine (148 mL), or one 1 oz glass of hard liquor (44 mL). Keep yourself hydrated with water, diet soda, or unsweetened iced tea. Keep  in mind that regular soda, juice, and other mixers may contain a lot of sugar and must be counted as carbs. What are tips for following this plan? Reading food labels Start by checking the serving size on the "Nutrition  Facts" label of packaged foods and drinks. The amount of calories, carbs, fats, and other nutrients listed on the label is based on one serving of the item. Many items contain more than one serving per package. Check the total grams (g) of carbs in one serving. You can calculate the number of servings of carbs in one serving by dividing the total carbs by 15. For example, if a food has 30 g of total carbs per serving, it would be equal to 2 servings of carbs. Check the number of grams (g) of saturated fats and trans fats in one serving. Choose foods that have a low amount or none of these fats. Check the number of milligrams (mg) of salt (sodium) in one serving. Most people should limit total sodium intake to less than 2,300 mg per day. Always check the nutrition information of foods labeled as "low-fat" or "nonfat." These foods may be higher in added sugar or refined carbs and should be avoided. Talk to your dietitian to identify your daily goals for nutrients listed on the label. Shopping Avoid buying canned, pre-made, or processed foods. These foods tend to be high in fat, sodium, and added sugar. Shop around the outside edge of the grocery store. This is where you will most often find fresh fruits and vegetables, bulk grains, fresh meats, and fresh dairy. Cooking Use low-heat cooking methods, such as baking, instead of high-heat cooking methods like deep frying. Cook using healthy oils, such as olive, canola, or sunflower oil. Avoid cooking with butter, cream, or high-fat meats. Meal planning Eat meals and snacks regularly, preferably at the same times every day. Avoid going long periods of time without eating. Eat foods that are high in fiber, such as fresh fruits, vegetables, beans, and whole grains. Talk with your dietitian about how many servings of carbs you can eat at each meal. Eat 4-6 oz (112-168 g) of lean protein each day, such as lean meat, chicken, fish, eggs, or tofu. One ounce (oz)  of lean protein is equal to: 1 oz (28 g) of meat, chicken, or fish. 1 egg.  cup (62 g) of tofu. Eat some foods each day that contain healthy fats, such as avocado, nuts, seeds, and fish. What foods should I eat? Fruits Berries. Apples. Oranges. Peaches. Apricots. Plums. Grapes. Mango. Papaya. Pomegranate. Kiwi. Cherries. Vegetables Lettuce. Spinach. Leafy greens, including kale, chard, collard greens, and mustard greens. Beets. Cauliflower. Cabbage. Broccoli. Carrots. Green beans. Tomatoes. Peppers. Onions. Cucumbers. Brussels sprouts. Grains Whole grains, such as whole-wheat or whole-grain bread, crackers, tortillas, cereal, and pasta. Unsweetened oatmeal. Quinoa. Brown or wild rice. Meats and other proteins Seafood. Poultry without skin. Lean cuts of poultry and beef. Tofu. Nuts. Seeds. Dairy Low-fat or fat-free dairy products such as milk, yogurt, and cheese. The items listed above may not be a complete list of foods and beverages you can eat. Contact a dietitian for more information. What foods should I avoid? Fruits Fruits canned with syrup. Vegetables Canned vegetables. Frozen vegetables with butter or cream sauce. Grains Refined white flour and flour products such as bread, pasta, snack foods, and cereals. Avoid all processed foods. Meats and other proteins Fatty cuts of meat. Poultry with skin. Breaded or fried meats. Processed meat. Avoid saturated fats. Dairy  Full-fat yogurt, cheese, or milk. Beverages Sweetened drinks, such as soda or iced tea. The items listed above may not be a complete list of foods and beverages you should avoid. Contact a dietitian for more information. Questions to ask a health care provider Do I need to meet with a diabetes educator? Do I need to meet with a dietitian? What number can I call if I have questions? When are the best times to check my blood glucose? Where to find more information: American Diabetes Association:  diabetes.org Academy of Nutrition and Dietetics: www.eatright.[employer] of Diabetes and Digestive and Kidney Diseases: [employer] Association of Diabetes Care and Education Specialists: www.diabeteseducator.org Summary It is important to have healthy eating habits because your blood sugar (glucose) levels are greatly affected by what you eat and drink. A healthy meal plan will help you control your blood glucose and maintain a healthy lifestyle. Your health care provider may recommend that you work with a dietitian to make a meal plan that is best for you. Keep in mind that carbohydrates (carbs) and alcohol have immediate effects on your blood glucose levels. It is important to count carbs and to use alcohol carefully. This information is not intended to replace advice given to you by your health care provider. Make sure you discuss any questions you have with your health care provider. Document Revised: 12/15/2018 Document Reviewed: 12/15/2018 Elsevier Patient Education  2021 [place], MD [facility]

## 2021-03-13 LAB — OB RESULTS CONSOLE HEPATITIS B SURFACE ANTIGEN: Hepatitis B Surface Ag: NEGATIVE

## 2021-03-13 LAB — OB RESULTS CONSOLE ABO/RH: RH Type: POSITIVE

## 2021-03-13 LAB — OB RESULTS CONSOLE RUBELLA ANTIBODY, IGM: Rubella: IMMUNE

## 2021-03-13 LAB — OB RESULTS CONSOLE HIV ANTIBODY (ROUTINE TESTING): HIV: NONREACTIVE

## 2021-03-13 LAB — OB RESULTS CONSOLE ANTIBODY SCREEN: Antibody Screen: NEGATIVE

## 2021-03-13 LAB — HEPATITIS C ANTIBODY: HCV Ab: NEGATIVE

## 2021-03-13 LAB — OB RESULTS CONSOLE RPR: RPR: NONREACTIVE

## 2021-03-21 LAB — OB RESULTS CONSOLE GC/CHLAMYDIA
Chlamydia: NEGATIVE
Neisseria Gonorrhea: NEGATIVE

## 2021-04-02 ENCOUNTER — Ambulatory Visit: Payer: BC Managed Care – PPO | Admitting: Emergency Medicine

## 2021-05-01 ENCOUNTER — Ambulatory Visit: Payer: BC Managed Care – PPO | Admitting: Emergency Medicine

## 2021-05-03 ENCOUNTER — Other Ambulatory Visit: Payer: Self-pay | Admitting: Obstetrics & Gynecology

## 2021-05-03 DIAGNOSIS — Z363 Encounter for antenatal screening for malformations: Secondary | ICD-10-CM

## 2021-06-04 ENCOUNTER — Ambulatory Visit: Payer: BC Managed Care – PPO | Attending: Obstetrics and Gynecology | Admitting: Obstetrics and Gynecology

## 2021-06-04 ENCOUNTER — Other Ambulatory Visit: Payer: Self-pay | Admitting: *Deleted

## 2021-06-04 ENCOUNTER — Ambulatory Visit: Payer: BC Managed Care – PPO | Attending: Obstetrics & Gynecology

## 2021-06-04 ENCOUNTER — Ambulatory Visit: Payer: BC Managed Care – PPO | Admitting: *Deleted

## 2021-06-04 ENCOUNTER — Encounter: Payer: Self-pay | Admitting: *Deleted

## 2021-06-04 VITALS — BP 136/84 | HR 88

## 2021-06-04 DIAGNOSIS — O09522 Supervision of elderly multigravida, second trimester: Secondary | ICD-10-CM

## 2021-06-04 DIAGNOSIS — O24112 Pre-existing diabetes mellitus, type 2, in pregnancy, second trimester: Secondary | ICD-10-CM

## 2021-06-04 DIAGNOSIS — O10012 Pre-existing essential hypertension complicating pregnancy, second trimester: Secondary | ICD-10-CM | POA: Diagnosis not present

## 2021-06-04 DIAGNOSIS — O34219 Maternal care for unspecified type scar from previous cesarean delivery: Secondary | ICD-10-CM | POA: Diagnosis not present

## 2021-06-04 DIAGNOSIS — Z363 Encounter for antenatal screening for malformations: Secondary | ICD-10-CM | POA: Diagnosis present

## 2021-06-04 DIAGNOSIS — Z362 Encounter for other antenatal screening follow-up: Secondary | ICD-10-CM

## 2021-06-04 DIAGNOSIS — Z3A19 19 weeks gestation of pregnancy: Secondary | ICD-10-CM

## 2021-06-04 DIAGNOSIS — O99842 Bariatric surgery status complicating pregnancy, second trimester: Secondary | ICD-10-CM

## 2021-06-04 DIAGNOSIS — O99212 Obesity complicating pregnancy, second trimester: Secondary | ICD-10-CM

## 2021-06-04 DIAGNOSIS — Z6841 Body Mass Index (BMI) 40.0 and over, adult: Secondary | ICD-10-CM

## 2021-06-04 NOTE — Progress Notes (Signed)
Maternal-Fetal Medicine  ? ?Name: Kendra Nguyen ?DOB: December 05, 1980 ?MRN: [id number] ?Referring Provider: Waymon Amato, MD ? ?I had the pleasure of seeing Ms. Holsten [facility] at [facility]. She is G2 P1001 at 19w 1d gestation and is here for fetal anatomical survey. She reports her EDD is 10/28/21 based on early ultrasound performed at your office. ? ?Her high-risk pregnancy problems include: ?-Advanced maternal age (61 years at delivery).  On cell-free fetal DNA screening, the risks of fetal aneuploidies are not increased. ?-Type 2 diabetes.  Diagnosed in this pregnancy.  Early screening showed a hemoglobin A1c level of 6.9% patient reports that has decreased to 5.9% now.  She takes Levemir 35 units in the morning and 30 units at night and NovoLog 8 to 14 units sliding scale with each meal.  She reports her blood glucose levels are better but still not yet well controlled.  She does not have nephropathy.  She reports occasional tingling in the legs. ?-Chronic hypertension.  Patient takes nifedipine XL 30 mg daily and reports her blood pressures are well controlled.  Her blood pressure today at her office is 136/84 mmHg. ?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 in 2016 and lost considerable weight.  Her prepregnancy weight was 381 pounds and her BMI was 61.5. ?-Previous cesarean section ?-Sickle cell trait.  Her partner is reportedly not a carrier of sickle cell disease. ?-Uterine leiomyoma. ? ?Past surgical history: Cesarean section, bariatric surgery, lap band surgery. ?Medications: Prenatal vitamins, insulin, nifedipine. ?Allergies: Neomycin (rashes). ?Social history: Denies tobacco or drug or alcohol use.  She is a Chief Technology Officer.  Her husband is African-American and he is in good health. ?Family history: Mother probably died of pulmonary embolism. ? ?Ultrasound ?We performed a fetal anatomical survey.  Amniotic fluid is normal and good fetal activity seen.   Fetal biometry is 8 weeks ahead of her established gestational age.  No markers of aneuploidies or obvious fetal structural defects are seen. ? ?Placenta is posterior and there is no evidence of previa or placenta accreta spectrum.  An intramural myoma is seen (measurements above). ?As maternal obesity imposes limitations on the resolution of images, fetal anomalies may be missed. ? ?Our concerns include ?Type 2 diabetes ?I discussed blood glucose parameters and the importance of good control of diabetes to prevent adverse fetal or neonatal outcomes.  Possible complications include fetal macrosomia, shoulder dystocia and neurological injuries at birth.  Neonatal complications include respiratory distress syndrome and hypoglycemia.  And poorly controlled diabetes stillbirth can occur.  The likelihood of congenital malformations with hemoglobin A1c level less than 7% is very low. ? ?Patient seems motivated and will be checking her blood glucose regularly.  I discussed the importance of exercise in addition to diet and insulin to control blood glucose levels. ? ?I discussed ultrasound protocol of monitoring fetal growth every 4 weeks and starting weekly BPP from [redacted] weeks gestation till delivery.  I discussed fetal echocardiography.  Patient opted to have fetal echocardiography. ? ?Timing of delivery is based on control of diabetes and hypertension.  If diabetes and hypertension are both well controlled, I recommend delivery around [redacted] weeks gestation.  I informed the patient that if diabetes and/or hypertension not well controlled, early term delivery ([redacted] weeks gestation) will be recommended. ? ?Chronic hypertension ?-Adverse outcomes of severe chronic hypertension include maternal stroke, endorgan damage, coagulation disturbances. Placental abruption is more common. ?-Superimposed preeclampsia occurs in more than 30% of women with  chronic hypertension ?I discussed the benefit of low-dose aspirin prophylaxis that helps  delaying or preventing preeclampsia. ?-I discussed the safety profile of antihypertensives.  Nifedipine can be safely given in pregnancy.  Alternative medications include labetalol and methyldopa. ?-I discussed ultrasound protocol of monitoring fetal growth assessment and antenatal testing. ? ?History of bariatric surgery. ?I discussed the benefit of bariatric surgery including reduction in the likelihood of gestational hypertension/preeclampsia.  Bariatric surgery can be associated with slight increase in fetal growth restriction.  Nutritional deficiencies should be screened and corrected.  Complications of bariatric surgery including bowel obstructions can occur but are rare. ? ?Previous cesarean delivery ?I briefly discussed VBAC and its complication of scar rupture (1%).  Repeat cesarean deliveries increase the risk of placenta previa and placenta accreta spectrum. ? ?Myomas ?Myomas are likely to increase in pregnancies and can rarely cause severe pain.  Preterm delivery rates are slightly increased with myomas. ? ?Recommendations ?-An appointment was made for her to return in 4 weeks for completion of fetal anatomy. ?-We have requested an appointment for fetal echocardiography. ?-Fetal growth assessments every 4 weeks till delivery. ?-Weekly BPP from [redacted] weeks gestation till delivery. ?-Delivery at 38 to [redacted] weeks gestation. ?-Low-dose aspirin prophylaxis. ?-Continue insulin and adjust dosage as necessary. ? ?Thank you for consultation.  If you have any questions or concerns, please contact me the [facility].  Consultation including face-to-face (more than 50%) counseling 45 minutes. ? ?

## 2021-06-22 ENCOUNTER — Ambulatory Visit: Payer: BC Managed Care – PPO | Admitting: Dietician

## 2021-07-03 ENCOUNTER — Ambulatory Visit: Payer: BC Managed Care – PPO | Attending: Obstetrics and Gynecology

## 2021-07-03 ENCOUNTER — Ambulatory Visit: Payer: BC Managed Care – PPO | Admitting: *Deleted

## 2021-07-03 VITALS — BP 140/79 | HR 92

## 2021-07-03 DIAGNOSIS — O99212 Obesity complicating pregnancy, second trimester: Secondary | ICD-10-CM

## 2021-07-03 DIAGNOSIS — Z6841 Body Mass Index (BMI) 40.0 and over, adult: Secondary | ICD-10-CM | POA: Insufficient documentation

## 2021-07-03 DIAGNOSIS — O24112 Pre-existing diabetes mellitus, type 2, in pregnancy, second trimester: Secondary | ICD-10-CM

## 2021-07-03 DIAGNOSIS — O09522 Supervision of elderly multigravida, second trimester: Secondary | ICD-10-CM | POA: Diagnosis not present

## 2021-07-03 DIAGNOSIS — Z148 Genetic carrier of other disease: Secondary | ICD-10-CM

## 2021-07-03 DIAGNOSIS — O34219 Maternal care for unspecified type scar from previous cesarean delivery: Secondary | ICD-10-CM

## 2021-07-03 DIAGNOSIS — O285 Abnormal chromosomal and genetic finding on antenatal screening of mother: Secondary | ICD-10-CM

## 2021-07-03 DIAGNOSIS — D573 Sickle-cell trait: Secondary | ICD-10-CM

## 2021-07-03 DIAGNOSIS — Z362 Encounter for other antenatal screening follow-up: Secondary | ICD-10-CM | POA: Diagnosis present

## 2021-07-03 DIAGNOSIS — O10012 Pre-existing essential hypertension complicating pregnancy, second trimester: Secondary | ICD-10-CM | POA: Diagnosis not present

## 2021-07-03 DIAGNOSIS — O3412 Maternal care for benign tumor of corpus uteri, second trimester: Secondary | ICD-10-CM

## 2021-07-03 DIAGNOSIS — D259 Leiomyoma of uterus, unspecified: Secondary | ICD-10-CM

## 2021-07-03 DIAGNOSIS — O99842 Bariatric surgery status complicating pregnancy, second trimester: Secondary | ICD-10-CM

## 2021-07-03 DIAGNOSIS — O09292 Supervision of pregnancy with other poor reproductive or obstetric history, second trimester: Secondary | ICD-10-CM

## 2021-07-03 DIAGNOSIS — E669 Obesity, unspecified: Secondary | ICD-10-CM

## 2021-07-03 DIAGNOSIS — Z3A23 23 weeks gestation of pregnancy: Secondary | ICD-10-CM

## 2021-07-04 ENCOUNTER — Other Ambulatory Visit: Payer: Self-pay | Admitting: *Deleted

## 2021-07-04 DIAGNOSIS — O09522 Supervision of elderly multigravida, second trimester: Secondary | ICD-10-CM

## 2021-07-04 DIAGNOSIS — O24112 Pre-existing diabetes mellitus, type 2, in pregnancy, second trimester: Secondary | ICD-10-CM

## 2021-07-04 DIAGNOSIS — R638 Other symptoms and signs concerning food and fluid intake: Secondary | ICD-10-CM

## 2021-07-04 DIAGNOSIS — D259 Leiomyoma of uterus, unspecified: Secondary | ICD-10-CM

## 2021-08-01 ENCOUNTER — Ambulatory Visit: Payer: BC Managed Care – PPO | Attending: Maternal & Fetal Medicine

## 2021-08-01 ENCOUNTER — Ambulatory Visit: Payer: BC Managed Care – PPO | Admitting: *Deleted

## 2021-08-01 VITALS — BP 176/80 | HR 114

## 2021-08-01 DIAGNOSIS — O10012 Pre-existing essential hypertension complicating pregnancy, second trimester: Secondary | ICD-10-CM

## 2021-08-01 DIAGNOSIS — D259 Leiomyoma of uterus, unspecified: Secondary | ICD-10-CM

## 2021-08-01 DIAGNOSIS — R638 Other symptoms and signs concerning food and fluid intake: Secondary | ICD-10-CM

## 2021-08-01 DIAGNOSIS — O09292 Supervision of pregnancy with other poor reproductive or obstetric history, second trimester: Secondary | ICD-10-CM

## 2021-08-01 DIAGNOSIS — O3412 Maternal care for benign tumor of corpus uteri, second trimester: Secondary | ICD-10-CM | POA: Diagnosis not present

## 2021-08-01 DIAGNOSIS — Z3A27 27 weeks gestation of pregnancy: Secondary | ICD-10-CM

## 2021-08-01 DIAGNOSIS — O09522 Supervision of elderly multigravida, second trimester: Secondary | ICD-10-CM | POA: Diagnosis not present

## 2021-08-01 DIAGNOSIS — O34219 Maternal care for unspecified type scar from previous cesarean delivery: Secondary | ICD-10-CM

## 2021-08-01 DIAGNOSIS — E669 Obesity, unspecified: Secondary | ICD-10-CM

## 2021-08-01 DIAGNOSIS — O24112 Pre-existing diabetes mellitus, type 2, in pregnancy, second trimester: Secondary | ICD-10-CM

## 2021-08-01 DIAGNOSIS — O99212 Obesity complicating pregnancy, second trimester: Secondary | ICD-10-CM

## 2021-08-02 ENCOUNTER — Other Ambulatory Visit: Payer: Self-pay | Admitting: *Deleted

## 2021-08-02 DIAGNOSIS — O10912 Unspecified pre-existing hypertension complicating pregnancy, second trimester: Secondary | ICD-10-CM

## 2021-08-02 DIAGNOSIS — O99212 Obesity complicating pregnancy, second trimester: Secondary | ICD-10-CM

## 2021-08-02 DIAGNOSIS — O09522 Supervision of elderly multigravida, second trimester: Secondary | ICD-10-CM

## 2021-08-02 DIAGNOSIS — O24112 Pre-existing diabetes mellitus, type 2, in pregnancy, second trimester: Secondary | ICD-10-CM

## 2021-08-07 ENCOUNTER — Other Ambulatory Visit: Payer: Self-pay | Admitting: *Deleted

## 2021-08-07 DIAGNOSIS — O09522 Supervision of elderly multigravida, second trimester: Secondary | ICD-10-CM

## 2021-08-07 DIAGNOSIS — O10912 Unspecified pre-existing hypertension complicating pregnancy, second trimester: Secondary | ICD-10-CM

## 2021-08-07 DIAGNOSIS — O24112 Pre-existing diabetes mellitus, type 2, in pregnancy, second trimester: Secondary | ICD-10-CM

## 2021-08-07 DIAGNOSIS — O99212 Obesity complicating pregnancy, second trimester: Secondary | ICD-10-CM

## 2021-08-29 ENCOUNTER — Encounter (INDEPENDENT_AMBULATORY_CARE_PROVIDER_SITE_OTHER): Payer: Self-pay

## 2021-08-31 ENCOUNTER — Encounter: Payer: Self-pay | Admitting: *Deleted

## 2021-08-31 ENCOUNTER — Ambulatory Visit: Payer: BC Managed Care – PPO | Attending: Maternal & Fetal Medicine

## 2021-08-31 ENCOUNTER — Ambulatory Visit: Payer: BC Managed Care – PPO | Admitting: *Deleted

## 2021-08-31 ENCOUNTER — Other Ambulatory Visit: Payer: Self-pay | Admitting: Maternal & Fetal Medicine

## 2021-08-31 ENCOUNTER — Ambulatory Visit (HOSPITAL_BASED_OUTPATIENT_CLINIC_OR_DEPARTMENT_OTHER): Payer: BC Managed Care – PPO | Admitting: *Deleted

## 2021-08-31 ENCOUNTER — Telehealth: Payer: Self-pay | Admitting: *Deleted

## 2021-08-31 VITALS — BP 135/80 | HR 89

## 2021-08-31 DIAGNOSIS — O10912 Unspecified pre-existing hypertension complicating pregnancy, second trimester: Secondary | ICD-10-CM | POA: Diagnosis present

## 2021-08-31 DIAGNOSIS — O09522 Supervision of elderly multigravida, second trimester: Secondary | ICD-10-CM

## 2021-08-31 DIAGNOSIS — O34219 Maternal care for unspecified type scar from previous cesarean delivery: Secondary | ICD-10-CM

## 2021-08-31 DIAGNOSIS — Z3689 Encounter for other specified antenatal screening: Secondary | ICD-10-CM

## 2021-08-31 DIAGNOSIS — O10013 Pre-existing essential hypertension complicating pregnancy, third trimester: Secondary | ICD-10-CM | POA: Diagnosis not present

## 2021-08-31 DIAGNOSIS — O403XX0 Polyhydramnios, third trimester, not applicable or unspecified: Secondary | ICD-10-CM | POA: Diagnosis not present

## 2021-08-31 DIAGNOSIS — O09523 Supervision of elderly multigravida, third trimester: Secondary | ICD-10-CM

## 2021-08-31 DIAGNOSIS — O10913 Unspecified pre-existing hypertension complicating pregnancy, third trimester: Secondary | ICD-10-CM | POA: Diagnosis present

## 2021-08-31 DIAGNOSIS — D573 Sickle-cell trait: Secondary | ICD-10-CM

## 2021-08-31 DIAGNOSIS — O99843 Bariatric surgery status complicating pregnancy, third trimester: Secondary | ICD-10-CM

## 2021-08-31 DIAGNOSIS — Z794 Long term (current) use of insulin: Secondary | ICD-10-CM

## 2021-08-31 DIAGNOSIS — O24113 Pre-existing diabetes mellitus, type 2, in pregnancy, third trimester: Secondary | ICD-10-CM

## 2021-08-31 DIAGNOSIS — Z148 Genetic carrier of other disease: Secondary | ICD-10-CM

## 2021-08-31 DIAGNOSIS — O285 Abnormal chromosomal and genetic finding on antenatal screening of mother: Secondary | ICD-10-CM

## 2021-08-31 DIAGNOSIS — O99212 Obesity complicating pregnancy, second trimester: Secondary | ICD-10-CM

## 2021-08-31 DIAGNOSIS — O99213 Obesity complicating pregnancy, third trimester: Secondary | ICD-10-CM

## 2021-08-31 DIAGNOSIS — Z6841 Body Mass Index (BMI) 40.0 and over, adult: Secondary | ICD-10-CM | POA: Diagnosis present

## 2021-08-31 DIAGNOSIS — O24112 Pre-existing diabetes mellitus, type 2, in pregnancy, second trimester: Secondary | ICD-10-CM

## 2021-08-31 DIAGNOSIS — E669 Obesity, unspecified: Secondary | ICD-10-CM

## 2021-08-31 DIAGNOSIS — D259 Leiomyoma of uterus, unspecified: Secondary | ICD-10-CM

## 2021-08-31 DIAGNOSIS — E119 Type 2 diabetes mellitus without complications: Secondary | ICD-10-CM

## 2021-08-31 DIAGNOSIS — O09293 Supervision of pregnancy with other poor reproductive or obstetric history, third trimester: Secondary | ICD-10-CM

## 2021-08-31 DIAGNOSIS — O3663X0 Maternal care for excessive fetal growth, third trimester, not applicable or unspecified: Secondary | ICD-10-CM

## 2021-08-31 DIAGNOSIS — Z3A31 31 weeks gestation of pregnancy: Secondary | ICD-10-CM

## 2021-08-31 DIAGNOSIS — O3413 Maternal care for benign tumor of corpus uteri, third trimester: Secondary | ICD-10-CM

## 2021-08-31 NOTE — Procedures (Signed)
JILLIENNE EGNER 17-Feb-1980 [redacted]w[redacted]d Fetus A Non-Stress Test Interpretation for 08/31/21  Indication: Chronic Hypertenstion, Diabetes, and Advanced Maternal Age >40 years  Fetal Heart Rate A Mode: External Baseline Rate (A): 145 bpm Variability: Minimal, Moderate Accelerations: 10 x 10 Decelerations: Variable Multiple birth?: No  Uterine Activity Mode: Toco Contraction Frequency (min): none Resting Tone Palpated: Relaxed  Interpretation (Fetal Testing) Nonstress Test Interpretation: Reactive Overall Impression: Reassuring for gestational age Comments: tracing reviewed by Dr. BGertie Exon

## 2021-08-31 NOTE — Telephone Encounter (Signed)
Pt Called and was worried because she didn't understand/remember what the Dr. Rockey Situ her after the NST. I called pt to reassure her of her baby passing the NST and explained the criteria we are looking for. Also made sure she understood the importance of kick counts and explained what they were and when she should notify her Doctor and go into MAU for further monitoring. She was thankful and understood .

## 2021-09-07 ENCOUNTER — Other Ambulatory Visit: Payer: Self-pay | Admitting: *Deleted

## 2021-09-07 ENCOUNTER — Ambulatory Visit (HOSPITAL_BASED_OUTPATIENT_CLINIC_OR_DEPARTMENT_OTHER): Payer: BC Managed Care – PPO | Admitting: Maternal & Fetal Medicine

## 2021-09-07 ENCOUNTER — Ambulatory Visit: Payer: BC Managed Care – PPO | Attending: Maternal & Fetal Medicine

## 2021-09-07 ENCOUNTER — Ambulatory Visit: Payer: BC Managed Care – PPO | Admitting: *Deleted

## 2021-09-07 VITALS — BP 145/79 | HR 115

## 2021-09-07 DIAGNOSIS — O99213 Obesity complicating pregnancy, third trimester: Secondary | ICD-10-CM

## 2021-09-07 DIAGNOSIS — E669 Obesity, unspecified: Secondary | ICD-10-CM

## 2021-09-07 DIAGNOSIS — Z794 Long term (current) use of insulin: Secondary | ICD-10-CM

## 2021-09-07 DIAGNOSIS — O10013 Pre-existing essential hypertension complicating pregnancy, third trimester: Secondary | ICD-10-CM

## 2021-09-07 DIAGNOSIS — Z148 Genetic carrier of other disease: Secondary | ICD-10-CM

## 2021-09-07 DIAGNOSIS — O10912 Unspecified pre-existing hypertension complicating pregnancy, second trimester: Secondary | ICD-10-CM | POA: Diagnosis present

## 2021-09-07 DIAGNOSIS — E119 Type 2 diabetes mellitus without complications: Secondary | ICD-10-CM

## 2021-09-07 DIAGNOSIS — O09523 Supervision of elderly multigravida, third trimester: Secondary | ICD-10-CM

## 2021-09-07 DIAGNOSIS — O24113 Pre-existing diabetes mellitus, type 2, in pregnancy, third trimester: Secondary | ICD-10-CM | POA: Diagnosis present

## 2021-09-07 DIAGNOSIS — O24112 Pre-existing diabetes mellitus, type 2, in pregnancy, second trimester: Secondary | ICD-10-CM | POA: Diagnosis present

## 2021-09-07 DIAGNOSIS — O99212 Obesity complicating pregnancy, second trimester: Secondary | ICD-10-CM | POA: Diagnosis present

## 2021-09-07 DIAGNOSIS — O09293 Supervision of pregnancy with other poor reproductive or obstetric history, third trimester: Secondary | ICD-10-CM

## 2021-09-07 DIAGNOSIS — D573 Sickle-cell trait: Secondary | ICD-10-CM

## 2021-09-07 DIAGNOSIS — O3663X0 Maternal care for excessive fetal growth, third trimester, not applicable or unspecified: Secondary | ICD-10-CM

## 2021-09-07 DIAGNOSIS — O99843 Bariatric surgery status complicating pregnancy, third trimester: Secondary | ICD-10-CM

## 2021-09-07 DIAGNOSIS — O0990 Supervision of high risk pregnancy, unspecified, unspecified trimester: Secondary | ICD-10-CM

## 2021-09-07 DIAGNOSIS — O09522 Supervision of elderly multigravida, second trimester: Secondary | ICD-10-CM | POA: Diagnosis present

## 2021-09-07 DIAGNOSIS — O285 Abnormal chromosomal and genetic finding on antenatal screening of mother: Secondary | ICD-10-CM

## 2021-09-07 DIAGNOSIS — Z3A32 32 weeks gestation of pregnancy: Secondary | ICD-10-CM

## 2021-09-07 DIAGNOSIS — O10913 Unspecified pre-existing hypertension complicating pregnancy, third trimester: Secondary | ICD-10-CM

## 2021-09-07 DIAGNOSIS — D259 Leiomyoma of uterus, unspecified: Secondary | ICD-10-CM

## 2021-09-07 DIAGNOSIS — O34219 Maternal care for unspecified type scar from previous cesarean delivery: Secondary | ICD-10-CM

## 2021-09-07 DIAGNOSIS — O3413 Maternal care for benign tumor of corpus uteri, third trimester: Secondary | ICD-10-CM

## 2021-09-07 NOTE — Progress Notes (Addendum)
MFM Consult Note Patient Name: Kendra Nguyen  Patient MRN:   [id number]  Referring provider: [place]  Reason for Consult: Chronic hypertension on medications with discussion of delivery timing    HPI: KAMEAH Nguyen is a 41 y.o. G2P1001 at 33w5dhere for a BPP due to chronic hypertension on Procardia, type 2 DM on insulin, prior gastric bipass surgery, history of macrosomic infant, prior cesarean delivery, morbid obesity, SMA carrier, sickle cell trait and fetus with perimembranous VSD.   Review of Systems: A review of systems was performed and was negative except per HPI.   BP: Blood pressure was 153/82 with a repeat of 145/79, pulse 115, prepregnancy BMI of 61.   Counseling I discussed the ultrasound findings with the patient.  The BPP was 8 out of 8 and the estimated fetal weight was over the 99th percentile on 08/31/2021. She also has poly w/ an AFI of 24.3, which is likely due to her diabetes.  I discussed that the delivery timing will depend on her degree of blood pressure control as well as her glucose control.  Today I discussed blood pressure goals of less than 140/90.  I discussed that she should obtain an at home blood pressure cuff and have it calibrated and her providers clinic to assess her blood pressure at home.  We discussed the possibility of an early delivery due to worsening blood pressure or development of preeclampsia.  I discussed the importance and use of antenatal testing and serial growth ultrasounds as well as delivery prior to 39 weeks in some cases to reduce the risk of stillbirth.  We also briefly discussed attempting a vaginal birth after cesarean delivery.  She desires to attempt a trial of labor but ultimately wants to do what is recommended based on how her fetus is doing.  I emphasized the importance of monitoring for signs and symptoms of preeclampsia.  She will have a blood pressure check next Friday in the office with her provider.   Recommendations 1. Recommend  at home blood pressure cuff with calibration in her provider's clinic to assess accuracy. 2. Continue to monitor for symptoms of preeclampsia - I discussed this with the patient. 3. Blood pressure goal of less than 140/90 with medication adjustment as necessary. 4. Continue aspirin 81 mg daily. 5. Avoid NSAIDs postpartum due to history of bypass surgery. 6. Continue to assess micronutrients (iron, folate, B12, vitamin D). 7. Continue diabetic management per her provider. 8. Continue serial growth ultrasounds every 4 weeks and weekly antenatal testing. 9. Delivery timing will depend on blood pressure and glucose control but likely around 37 to 38 weeks. 10. Route of delivery will depend on the estimated fetal weight at the time of delivery.  Recommend cesarean delivery if the estimated fetal weight is over 4500 g at the time of delivery.  We will continue to assess this at future growth ultrasounds. 163       Notify pediatric delivery team regarding the perimembranous VSD. Per the patient the newborn will have a neonatal echo postpartum.

## 2021-09-14 ENCOUNTER — Ambulatory Visit: Payer: BC Managed Care – PPO | Attending: Maternal & Fetal Medicine

## 2021-09-14 ENCOUNTER — Ambulatory Visit: Payer: BC Managed Care – PPO | Admitting: *Deleted

## 2021-09-14 VITALS — BP 134/82 | HR 106

## 2021-09-14 DIAGNOSIS — E669 Obesity, unspecified: Secondary | ICD-10-CM

## 2021-09-14 DIAGNOSIS — O10912 Unspecified pre-existing hypertension complicating pregnancy, second trimester: Secondary | ICD-10-CM | POA: Diagnosis present

## 2021-09-14 DIAGNOSIS — D573 Sickle-cell trait: Secondary | ICD-10-CM

## 2021-09-14 DIAGNOSIS — O09523 Supervision of elderly multigravida, third trimester: Secondary | ICD-10-CM

## 2021-09-14 DIAGNOSIS — Z3A33 33 weeks gestation of pregnancy: Secondary | ICD-10-CM

## 2021-09-14 DIAGNOSIS — O24113 Pre-existing diabetes mellitus, type 2, in pregnancy, third trimester: Secondary | ICD-10-CM | POA: Diagnosis not present

## 2021-09-14 DIAGNOSIS — O285 Abnormal chromosomal and genetic finding on antenatal screening of mother: Secondary | ICD-10-CM

## 2021-09-14 DIAGNOSIS — O09522 Supervision of elderly multigravida, second trimester: Secondary | ICD-10-CM | POA: Insufficient documentation

## 2021-09-14 DIAGNOSIS — O24112 Pre-existing diabetes mellitus, type 2, in pregnancy, second trimester: Secondary | ICD-10-CM | POA: Insufficient documentation

## 2021-09-14 DIAGNOSIS — E119 Type 2 diabetes mellitus without complications: Secondary | ICD-10-CM

## 2021-09-14 DIAGNOSIS — O99212 Obesity complicating pregnancy, second trimester: Secondary | ICD-10-CM | POA: Diagnosis present

## 2021-09-14 DIAGNOSIS — O09293 Supervision of pregnancy with other poor reproductive or obstetric history, third trimester: Secondary | ICD-10-CM

## 2021-09-14 DIAGNOSIS — O403XX0 Polyhydramnios, third trimester, not applicable or unspecified: Secondary | ICD-10-CM | POA: Diagnosis not present

## 2021-09-14 DIAGNOSIS — O10913 Unspecified pre-existing hypertension complicating pregnancy, third trimester: Secondary | ICD-10-CM

## 2021-09-14 DIAGNOSIS — O34219 Maternal care for unspecified type scar from previous cesarean delivery: Secondary | ICD-10-CM

## 2021-09-14 DIAGNOSIS — O99843 Bariatric surgery status complicating pregnancy, third trimester: Secondary | ICD-10-CM

## 2021-09-14 DIAGNOSIS — O99213 Obesity complicating pregnancy, third trimester: Secondary | ICD-10-CM

## 2021-09-14 DIAGNOSIS — O3663X0 Maternal care for excessive fetal growth, third trimester, not applicable or unspecified: Secondary | ICD-10-CM

## 2021-09-14 DIAGNOSIS — O3413 Maternal care for benign tumor of corpus uteri, third trimester: Secondary | ICD-10-CM

## 2021-09-14 DIAGNOSIS — D259 Leiomyoma of uterus, unspecified: Secondary | ICD-10-CM

## 2021-09-14 DIAGNOSIS — O10013 Pre-existing essential hypertension complicating pregnancy, third trimester: Secondary | ICD-10-CM | POA: Diagnosis not present

## 2021-09-14 DIAGNOSIS — Z148 Genetic carrier of other disease: Secondary | ICD-10-CM

## 2021-09-14 DIAGNOSIS — Z794 Long term (current) use of insulin: Secondary | ICD-10-CM

## 2021-09-20 ENCOUNTER — Other Ambulatory Visit: Payer: Self-pay | Admitting: Obstetrics and Gynecology

## 2021-09-21 ENCOUNTER — Ambulatory Visit: Payer: BC Managed Care – PPO | Attending: Maternal & Fetal Medicine

## 2021-09-21 ENCOUNTER — Ambulatory Visit: Payer: BC Managed Care – PPO | Admitting: *Deleted

## 2021-09-21 VITALS — BP 145/84 | HR 106

## 2021-09-21 DIAGNOSIS — O09522 Supervision of elderly multigravida, second trimester: Secondary | ICD-10-CM | POA: Diagnosis present

## 2021-09-21 DIAGNOSIS — O3413 Maternal care for benign tumor of corpus uteri, third trimester: Secondary | ICD-10-CM

## 2021-09-21 DIAGNOSIS — O99843 Bariatric surgery status complicating pregnancy, third trimester: Secondary | ICD-10-CM

## 2021-09-21 DIAGNOSIS — Z794 Long term (current) use of insulin: Secondary | ICD-10-CM

## 2021-09-21 DIAGNOSIS — O24112 Pre-existing diabetes mellitus, type 2, in pregnancy, second trimester: Secondary | ICD-10-CM | POA: Insufficient documentation

## 2021-09-21 DIAGNOSIS — O403XX0 Polyhydramnios, third trimester, not applicable or unspecified: Secondary | ICD-10-CM

## 2021-09-21 DIAGNOSIS — O99213 Obesity complicating pregnancy, third trimester: Secondary | ICD-10-CM

## 2021-09-21 DIAGNOSIS — Z3689 Encounter for other specified antenatal screening: Secondary | ICD-10-CM | POA: Insufficient documentation

## 2021-09-21 DIAGNOSIS — O24113 Pre-existing diabetes mellitus, type 2, in pregnancy, third trimester: Secondary | ICD-10-CM | POA: Diagnosis not present

## 2021-09-21 DIAGNOSIS — Z3A34 34 weeks gestation of pregnancy: Secondary | ICD-10-CM

## 2021-09-21 DIAGNOSIS — Z148 Genetic carrier of other disease: Secondary | ICD-10-CM

## 2021-09-21 DIAGNOSIS — O99212 Obesity complicating pregnancy, second trimester: Secondary | ICD-10-CM | POA: Diagnosis present

## 2021-09-21 DIAGNOSIS — E119 Type 2 diabetes mellitus without complications: Secondary | ICD-10-CM | POA: Diagnosis not present

## 2021-09-21 DIAGNOSIS — O285 Abnormal chromosomal and genetic finding on antenatal screening of mother: Secondary | ICD-10-CM

## 2021-09-21 DIAGNOSIS — D573 Sickle-cell trait: Secondary | ICD-10-CM

## 2021-09-21 DIAGNOSIS — O09523 Supervision of elderly multigravida, third trimester: Secondary | ICD-10-CM

## 2021-09-21 DIAGNOSIS — E669 Obesity, unspecified: Secondary | ICD-10-CM

## 2021-09-21 DIAGNOSIS — O10013 Pre-existing essential hypertension complicating pregnancy, third trimester: Secondary | ICD-10-CM | POA: Diagnosis not present

## 2021-09-21 DIAGNOSIS — O09293 Supervision of pregnancy with other poor reproductive or obstetric history, third trimester: Secondary | ICD-10-CM

## 2021-09-21 DIAGNOSIS — O10912 Unspecified pre-existing hypertension complicating pregnancy, second trimester: Secondary | ICD-10-CM | POA: Diagnosis present

## 2021-09-21 DIAGNOSIS — D259 Leiomyoma of uterus, unspecified: Secondary | ICD-10-CM

## 2021-09-21 DIAGNOSIS — O3663X0 Maternal care for excessive fetal growth, third trimester, not applicable or unspecified: Secondary | ICD-10-CM

## 2021-09-21 DIAGNOSIS — O34219 Maternal care for unspecified type scar from previous cesarean delivery: Secondary | ICD-10-CM

## 2021-09-25 ENCOUNTER — Encounter (HOSPITAL_COMMUNITY): Payer: Self-pay

## 2021-09-25 ENCOUNTER — Telehealth (HOSPITAL_COMMUNITY): Payer: Self-pay | Admitting: *Deleted

## 2021-09-25 NOTE — Telephone Encounter (Signed)
Preadmission screen  

## 2021-09-28 ENCOUNTER — Ambulatory Visit: Payer: BC Managed Care – PPO | Attending: Maternal & Fetal Medicine | Admitting: *Deleted

## 2021-09-28 ENCOUNTER — Encounter: Payer: Self-pay | Admitting: *Deleted

## 2021-09-28 ENCOUNTER — Ambulatory Visit: Payer: BC Managed Care – PPO | Admitting: *Deleted

## 2021-09-28 VITALS — BP 132/75 | HR 92

## 2021-09-28 DIAGNOSIS — O09523 Supervision of elderly multigravida, third trimester: Secondary | ICD-10-CM | POA: Insufficient documentation

## 2021-09-28 DIAGNOSIS — O10913 Unspecified pre-existing hypertension complicating pregnancy, third trimester: Secondary | ICD-10-CM | POA: Insufficient documentation

## 2021-09-28 DIAGNOSIS — O34219 Maternal care for unspecified type scar from previous cesarean delivery: Secondary | ICD-10-CM | POA: Diagnosis not present

## 2021-09-28 DIAGNOSIS — O3663X1 Maternal care for excessive fetal growth, third trimester, fetus 1: Secondary | ICD-10-CM | POA: Diagnosis not present

## 2021-09-28 DIAGNOSIS — O99213 Obesity complicating pregnancy, third trimester: Secondary | ICD-10-CM | POA: Diagnosis not present

## 2021-09-28 DIAGNOSIS — O24113 Pre-existing diabetes mellitus, type 2, in pregnancy, third trimester: Secondary | ICD-10-CM | POA: Insufficient documentation

## 2021-09-28 DIAGNOSIS — E119 Type 2 diabetes mellitus without complications: Secondary | ICD-10-CM | POA: Diagnosis not present

## 2021-09-28 DIAGNOSIS — Z3A35 35 weeks gestation of pregnancy: Secondary | ICD-10-CM | POA: Diagnosis not present

## 2021-09-28 NOTE — Procedures (Signed)
Kendra Nguyen [birth year] [redacted]w[redacted]d Fetus A Non-Stress Test Interpretation for 09/28/21  Indication: Chronic Hypertenstion, Diabetes, Advanced Maternal Age >40 years, and morbidly obese  Fetal Heart Rate A Mode: External Baseline Rate (A): 140 bpm Variability: Moderate Accelerations: 15 x 15 Decelerations: Variable Multiple birth?: No  Uterine Activity Mode: Toco Contraction Frequency (min): none Resting Tone Palpated: Relaxed Resting Time: Adequate  Interpretation (Fetal Testing) Nonstress Test Interpretation: Reactive Overall Impression: Reassuring for gestational age Comments: tracing reviewed by Dr. SDonalee Citrin

## 2021-10-04 ENCOUNTER — Telehealth (HOSPITAL_COMMUNITY): Payer: Self-pay | Admitting: *Deleted

## 2021-10-04 ENCOUNTER — Encounter (HOSPITAL_COMMUNITY): Payer: Self-pay | Admitting: *Deleted

## 2021-10-04 NOTE — Telephone Encounter (Signed)
Preadmission screen  

## 2021-10-05 ENCOUNTER — Ambulatory Visit (HOSPITAL_BASED_OUTPATIENT_CLINIC_OR_DEPARTMENT_OTHER): Payer: BC Managed Care – PPO

## 2021-10-05 ENCOUNTER — Ambulatory Visit: Payer: BC Managed Care – PPO | Admitting: *Deleted

## 2021-10-05 DIAGNOSIS — O09523 Supervision of elderly multigravida, third trimester: Secondary | ICD-10-CM

## 2021-10-05 DIAGNOSIS — O99213 Obesity complicating pregnancy, third trimester: Secondary | ICD-10-CM

## 2021-10-05 DIAGNOSIS — O285 Abnormal chromosomal and genetic finding on antenatal screening of mother: Secondary | ICD-10-CM

## 2021-10-05 DIAGNOSIS — O10913 Unspecified pre-existing hypertension complicating pregnancy, third trimester: Secondary | ICD-10-CM | POA: Insufficient documentation

## 2021-10-05 DIAGNOSIS — O3663X0 Maternal care for excessive fetal growth, third trimester, not applicable or unspecified: Secondary | ICD-10-CM | POA: Diagnosis not present

## 2021-10-05 DIAGNOSIS — O3413 Maternal care for benign tumor of corpus uteri, third trimester: Secondary | ICD-10-CM

## 2021-10-05 DIAGNOSIS — Z3A36 36 weeks gestation of pregnancy: Secondary | ICD-10-CM

## 2021-10-05 DIAGNOSIS — D573 Sickle-cell trait: Secondary | ICD-10-CM

## 2021-10-05 DIAGNOSIS — O24113 Pre-existing diabetes mellitus, type 2, in pregnancy, third trimester: Secondary | ICD-10-CM | POA: Insufficient documentation

## 2021-10-05 DIAGNOSIS — E669 Obesity, unspecified: Secondary | ICD-10-CM

## 2021-10-05 DIAGNOSIS — O10013 Pre-existing essential hypertension complicating pregnancy, third trimester: Secondary | ICD-10-CM | POA: Diagnosis not present

## 2021-10-05 DIAGNOSIS — O99843 Bariatric surgery status complicating pregnancy, third trimester: Secondary | ICD-10-CM

## 2021-10-05 DIAGNOSIS — O09293 Supervision of pregnancy with other poor reproductive or obstetric history, third trimester: Secondary | ICD-10-CM

## 2021-10-05 DIAGNOSIS — O34219 Maternal care for unspecified type scar from previous cesarean delivery: Secondary | ICD-10-CM

## 2021-10-05 DIAGNOSIS — D259 Leiomyoma of uterus, unspecified: Secondary | ICD-10-CM

## 2021-10-07 NOTE — Addendum Note (Signed)
Addended by: Ulice Dash on: 10/07/2021 10:48 AM   Modules accepted: Level of Service

## 2021-10-08 ENCOUNTER — Inpatient Hospital Stay (HOSPITAL_COMMUNITY): Payer: BC Managed Care – PPO

## 2021-10-08 ENCOUNTER — Encounter (HOSPITAL_COMMUNITY): Payer: Self-pay | Admitting: Obstetrics and Gynecology

## 2021-10-08 ENCOUNTER — Inpatient Hospital Stay (HOSPITAL_COMMUNITY): Payer: BC Managed Care – PPO | Admitting: Anesthesiology

## 2021-10-08 ENCOUNTER — Other Ambulatory Visit: Payer: Self-pay

## 2021-10-08 ENCOUNTER — Inpatient Hospital Stay (HOSPITAL_COMMUNITY)
Admission: AD | Admit: 2021-10-08 | Discharge: 2021-10-11 | DRG: 783 | Disposition: A | Discharge status: Home / Self Care | Payer: BC Managed Care – PPO | Attending: Obstetrics and Gynecology | Admitting: Obstetrics and Gynecology

## 2021-10-08 ENCOUNTER — Encounter (HOSPITAL_COMMUNITY)
Admission: AD | Disposition: A | Discharge status: Home / Self Care | Payer: Self-pay | Source: Home / Self Care | Attending: Obstetrics and Gynecology

## 2021-10-08 ENCOUNTER — Inpatient Hospital Stay (HOSPITAL_BASED_OUTPATIENT_CLINIC_OR_DEPARTMENT_OTHER): Payer: BC Managed Care – PPO

## 2021-10-08 DIAGNOSIS — O24113 Pre-existing diabetes mellitus, type 2, in pregnancy, third trimester: Secondary | ICD-10-CM | POA: Diagnosis not present

## 2021-10-08 DIAGNOSIS — O3660X0 Maternal care for excessive fetal growth, unspecified trimester, not applicable or unspecified: Secondary | ICD-10-CM

## 2021-10-08 DIAGNOSIS — O99213 Obesity complicating pregnancy, third trimester: Secondary | ICD-10-CM | POA: Diagnosis not present

## 2021-10-08 DIAGNOSIS — O99844 Bariatric surgery status complicating childbirth: Secondary | ICD-10-CM | POA: Diagnosis present

## 2021-10-08 DIAGNOSIS — O322XX0 Maternal care for transverse and oblique lie, not applicable or unspecified: Secondary | ICD-10-CM | POA: Diagnosis present

## 2021-10-08 DIAGNOSIS — Z794 Long term (current) use of insulin: Secondary | ICD-10-CM | POA: Diagnosis not present

## 2021-10-08 DIAGNOSIS — Z23 Encounter for immunization: Secondary | ICD-10-CM

## 2021-10-08 DIAGNOSIS — O358XX0 Maternal care for other (suspected) fetal abnormality and damage, not applicable or unspecified: Secondary | ICD-10-CM | POA: Diagnosis present

## 2021-10-08 DIAGNOSIS — O99214 Obesity complicating childbirth: Secondary | ICD-10-CM | POA: Diagnosis present

## 2021-10-08 DIAGNOSIS — O403XX0 Polyhydramnios, third trimester, not applicable or unspecified: Secondary | ICD-10-CM | POA: Diagnosis present

## 2021-10-08 DIAGNOSIS — Z3A37 37 weeks gestation of pregnancy: Secondary | ICD-10-CM

## 2021-10-08 DIAGNOSIS — O24424 Gestational diabetes mellitus in childbirth, insulin controlled: Secondary | ICD-10-CM | POA: Diagnosis not present

## 2021-10-08 DIAGNOSIS — Z302 Encounter for sterilization: Secondary | ICD-10-CM

## 2021-10-08 DIAGNOSIS — O34219 Maternal care for unspecified type scar from previous cesarean delivery: Secondary | ICD-10-CM

## 2021-10-08 DIAGNOSIS — O34211 Maternal care for low transverse scar from previous cesarean delivery: Secondary | ICD-10-CM | POA: Diagnosis present

## 2021-10-08 DIAGNOSIS — D259 Leiomyoma of uterus, unspecified: Secondary | ICD-10-CM

## 2021-10-08 DIAGNOSIS — Z98891 History of uterine scar from previous surgery: Secondary | ICD-10-CM

## 2021-10-08 DIAGNOSIS — O3413 Maternal care for benign tumor of corpus uteri, third trimester: Secondary | ICD-10-CM

## 2021-10-08 DIAGNOSIS — O1002 Pre-existing essential hypertension complicating childbirth: Secondary | ICD-10-CM | POA: Diagnosis present

## 2021-10-08 DIAGNOSIS — O2412 Pre-existing diabetes mellitus, type 2, in childbirth: Secondary | ICD-10-CM | POA: Diagnosis present

## 2021-10-08 DIAGNOSIS — O09523 Supervision of elderly multigravida, third trimester: Secondary | ICD-10-CM

## 2021-10-08 DIAGNOSIS — D573 Sickle-cell trait: Secondary | ICD-10-CM

## 2021-10-08 DIAGNOSIS — O99843 Bariatric surgery status complicating pregnancy, third trimester: Secondary | ICD-10-CM

## 2021-10-08 DIAGNOSIS — Z3490 Encounter for supervision of normal pregnancy, unspecified, unspecified trimester: Secondary | ICD-10-CM | POA: Diagnosis present

## 2021-10-08 DIAGNOSIS — O10013 Pre-existing essential hypertension complicating pregnancy, third trimester: Secondary | ICD-10-CM

## 2021-10-08 DIAGNOSIS — O285 Abnormal chromosomal and genetic finding on antenatal screening of mother: Secondary | ICD-10-CM

## 2021-10-08 DIAGNOSIS — O3663X0 Maternal care for excessive fetal growth, third trimester, not applicable or unspecified: Principal | ICD-10-CM | POA: Diagnosis present

## 2021-10-08 DIAGNOSIS — E119 Type 2 diabetes mellitus without complications: Secondary | ICD-10-CM | POA: Diagnosis present

## 2021-10-08 DIAGNOSIS — E669 Obesity, unspecified: Secondary | ICD-10-CM

## 2021-10-08 LAB — CBC
HCT: 32.4 % — ABNORMAL LOW (ref 36.0–46.0)
Hemoglobin: 10.9 g/dL — ABNORMAL LOW (ref 12.0–15.0)
MCH: 28.6 pg (ref 26.0–34.0)
MCHC: 33.6 g/dL (ref 30.0–36.0)
MCV: 85 fL (ref 80.0–100.0)
Platelets: 240 10*3/uL (ref 150–400)
RBC: 3.81 MIL/uL — ABNORMAL LOW (ref 3.87–5.11)
RDW: 15.9 % — ABNORMAL HIGH (ref 11.5–15.5)
WBC: 10 10*3/uL (ref 4.0–10.5)
nRBC: 0 % (ref 0.0–0.2)

## 2021-10-08 LAB — GROUP B STREP BY PCR: Group B strep by PCR: NEGATIVE

## 2021-10-08 LAB — TYPE AND SCREEN
ABO/RH(D): AB POS
Antibody Screen: NEGATIVE

## 2021-10-08 LAB — RPR: RPR Ser Ql: NONREACTIVE

## 2021-10-08 SURGERY — Surgical Case
Anesthesia: Spinal

## 2021-10-08 MED ORDER — SENNOSIDES-DOCUSATE SODIUM 8.6-50 MG PO TABS
2.0000 | ORAL_TABLET | Freq: Every day | ORAL | Status: DC
  Administered 2021-10-09 – 2021-10-11 (×3): 2 via ORAL
  Filled 2021-10-08 (×3): qty 2

## 2021-10-08 MED ORDER — AMISULPRIDE (ANTIEMETIC) 5 MG/2ML IV SOLN: 10.0000 mg | Freq: Once | INTRAVENOUS | Status: DC | PRN

## 2021-10-08 MED ORDER — OXYCODONE-ACETAMINOPHEN 5-325 MG PO TABS: 1.0000 | ORAL_TABLET | ORAL | Status: DC | PRN

## 2021-10-08 MED ORDER — OXYCODONE-ACETAMINOPHEN 5-325 MG PO TABS: 2.0000 | ORAL_TABLET | ORAL | Status: DC | PRN

## 2021-10-08 MED ORDER — LACTATED RINGERS IV SOLN: 500.0000 mL | INTRAVENOUS | Status: DC | PRN

## 2021-10-08 MED ORDER — PROMETHAZINE HCL 25 MG/ML IJ SOLN: 6.2500 mg | INTRAMUSCULAR | Status: DC | PRN

## 2021-10-08 MED ORDER — BUPIVACAINE HCL (PF) 0.25 % IJ SOLN
INTRAMUSCULAR | Status: AC
  Filled 2021-10-08: qty 30

## 2021-10-08 MED ORDER — DIPHENHYDRAMINE HCL 50 MG/ML IJ SOLN
INTRAMUSCULAR | Status: AC
  Filled 2021-10-08: qty 1

## 2021-10-08 MED ORDER — ONDANSETRON HCL 4 MG/2ML IJ SOLN
INTRAMUSCULAR | Status: AC
  Filled 2021-10-08: qty 2

## 2021-10-08 MED ORDER — OXYTOCIN-SODIUM CHLORIDE 30-0.9 UT/500ML-% IV SOLN
2.5000 [IU]/h | INTRAVENOUS | Status: DC
  Administered 2021-10-08: 30 [IU] via INTRAVENOUS

## 2021-10-08 MED ORDER — FENTANYL CITRATE (PF) 100 MCG/2ML IJ SOLN
INTRAMUSCULAR | Status: AC
  Filled 2021-10-08: qty 2

## 2021-10-08 MED ORDER — NIFEDIPINE ER OSMOTIC RELEASE 30 MG PO TB24
30.0000 mg | ORAL_TABLET | Freq: Every day | ORAL | Status: DC
  Administered 2021-10-09 – 2021-10-11 (×3): 30 mg via ORAL
  Filled 2021-10-08 (×3): qty 1

## 2021-10-08 MED ORDER — TRANEXAMIC ACID-NACL 1000-0.7 MG/100ML-% IV SOLN
INTRAVENOUS | Status: DC | PRN
  Administered 2021-10-08: 1000 mg via INTRAVENOUS

## 2021-10-08 MED ORDER — TRANEXAMIC ACID-NACL 1000-0.7 MG/100ML-% IV SOLN
INTRAVENOUS | Status: AC
  Filled 2021-10-08: qty 100

## 2021-10-08 MED ORDER — PHENYLEPHRINE HCL-NACL 20-0.9 MG/250ML-% IV SOLN
INTRAVENOUS | Status: AC
  Filled 2021-10-08: qty 250

## 2021-10-08 MED ORDER — ENOXAPARIN SODIUM 100 MG/ML IJ SOSY
90.0000 mg | PREFILLED_SYRINGE | INTRAMUSCULAR | Status: DC
  Administered 2021-10-09 – 2021-10-10 (×2): 90 mg via SUBCUTANEOUS
  Filled 2021-10-08 (×3): qty 0.9

## 2021-10-08 MED ORDER — DEXAMETHASONE SODIUM PHOSPHATE 4 MG/ML IJ SOLN
INTRAMUSCULAR | Status: AC
  Filled 2021-10-08: qty 1

## 2021-10-08 MED ORDER — ONDANSETRON HCL 4 MG/2ML IJ SOLN
4.0000 mg | Freq: Four times a day (QID) | INTRAMUSCULAR | Status: DC | PRN
  Administered 2021-10-08: 4 mg via INTRAVENOUS

## 2021-10-08 MED ORDER — OXYTOCIN BOLUS FROM INFUSION: 333.0000 mL | Freq: Once | INTRAVENOUS | Status: DC

## 2021-10-08 MED ORDER — OXYTOCIN-SODIUM CHLORIDE 30-0.9 UT/500ML-% IV SOLN
INTRAVENOUS | Status: AC
  Filled 2021-10-08: qty 500

## 2021-10-08 MED ORDER — OXYTOCIN-SODIUM CHLORIDE 30-0.9 UT/500ML-% IV SOLN: 1.0000 m[IU]/min | INTRAVENOUS | Status: DC

## 2021-10-08 MED ORDER — PRENATAL MULTIVITAMIN CH
1.0000 | ORAL_TABLET | Freq: Every day | ORAL | Status: DC
  Administered 2021-10-09 – 2021-10-11 (×3): 1 via ORAL
  Filled 2021-10-08 (×3): qty 1

## 2021-10-08 MED ORDER — SOD CITRATE-CITRIC ACID 500-334 MG/5ML PO SOLN
30.0000 mL | ORAL | Status: DC | PRN
  Administered 2021-10-08: 30 mL via ORAL
  Filled 2021-10-08: qty 30

## 2021-10-08 MED ORDER — LIDOCAINE HCL (PF) 1 % IJ SOLN: 30.0000 mL | INTRAMUSCULAR | Status: DC | PRN

## 2021-10-08 MED ORDER — DEXTROSE 5 % IV SOLN
INTRAVENOUS | Status: DC | PRN
  Administered 2021-10-08: 3 g via INTRAVENOUS

## 2021-10-08 MED ORDER — CEFAZOLIN IN SODIUM CHLORIDE 3-0.9 GM/100ML-% IV SOLN
INTRAVENOUS | Status: AC
  Filled 2021-10-08: qty 100

## 2021-10-08 MED ORDER — FENTANYL CITRATE (PF) 100 MCG/2ML IJ SOLN
25.0000 ug | INTRAMUSCULAR | Status: DC | PRN
  Administered 2021-10-08 (×2): 25 ug via INTRAVENOUS

## 2021-10-08 MED ORDER — INSULIN DETEMIR 100 UNIT/ML ~~LOC~~ SOLN
25.0000 [IU] | Freq: Every day | SUBCUTANEOUS | Status: DC
  Administered 2021-10-09 – 2021-10-10 (×2): 25 [IU] via SUBCUTANEOUS
  Filled 2021-10-08 (×2): qty 0.25

## 2021-10-08 MED ORDER — ACETAMINOPHEN 10 MG/ML IV SOLN
INTRAVENOUS | Status: DC | PRN
  Administered 2021-10-08: 1000 mg via INTRAVENOUS

## 2021-10-08 MED ORDER — SCOPOLAMINE 1 MG/3DAYS TD PT72: 1.0000 | MEDICATED_PATCH | TRANSDERMAL | Status: DC

## 2021-10-08 MED ORDER — SCOPOLAMINE 1 MG/3DAYS TD PT72
1.0000 | MEDICATED_PATCH | Freq: Once | TRANSDERMAL | Status: AC
  Administered 2021-10-08: 1.5 mg via TRANSDERMAL

## 2021-10-08 MED ORDER — LACTATED RINGERS IV SOLN: INTRAVENOUS | Status: DC

## 2021-10-08 MED ORDER — PHENYLEPHRINE HCL-NACL 20-0.9 MG/250ML-% IV SOLN
INTRAVENOUS | Status: DC | PRN
  Administered 2021-10-08: 60 ug/min via INTRAVENOUS

## 2021-10-08 MED ORDER — SIMETHICONE 80 MG PO CHEW
80.0000 mg | CHEWABLE_TABLET | Freq: Three times a day (TID) | ORAL | Status: DC
  Administered 2021-10-09 – 2021-10-11 (×7): 80 mg via ORAL
  Filled 2021-10-08 (×7): qty 1

## 2021-10-08 MED ORDER — DEXAMETHASONE SODIUM PHOSPHATE 10 MG/ML IJ SOLN
INTRAMUSCULAR | Status: DC | PRN
  Administered 2021-10-08: 4 mg via INTRAVENOUS

## 2021-10-08 MED ORDER — SODIUM CHLORIDE 0.9% FLUSH: 3.0000 mL | INTRAVENOUS | Status: DC | PRN

## 2021-10-08 MED ORDER — SODIUM CHLORIDE 0.9 % IR SOLN
Status: DC | PRN
  Administered 2021-10-08: 1000 mL
  Administered 2021-10-08: 1

## 2021-10-08 MED ORDER — ACETAMINOPHEN 500 MG PO TABS
1000.0000 mg | ORAL_TABLET | Freq: Four times a day (QID) | ORAL | Status: DC
  Administered 2021-10-08 – 2021-10-11 (×11): 1000 mg via ORAL
  Filled 2021-10-08 (×10): qty 2

## 2021-10-08 MED ORDER — MENTHOL 3 MG MT LOZG: 1.0000 | LOZENGE | OROMUCOSAL | Status: DC | PRN

## 2021-10-08 MED ORDER — FENTANYL CITRATE (PF) 100 MCG/2ML IJ SOLN
INTRAMUSCULAR | Status: DC | PRN
  Administered 2021-10-08: 15 ug via INTRATHECAL

## 2021-10-08 MED ORDER — ONDANSETRON HCL 4 MG/2ML IJ SOLN: 4.0000 mg | Freq: Three times a day (TID) | INTRAMUSCULAR | Status: DC | PRN

## 2021-10-08 MED ORDER — BUPIVACAINE IN DEXTROSE 0.75-8.25 % IT SOLN
INTRATHECAL | Status: DC | PRN
  Administered 2021-10-08: 1.8 mL via INTRATHECAL

## 2021-10-08 MED ORDER — COCONUT OIL OIL: 1.0000 | TOPICAL_OIL | Status: DC | PRN

## 2021-10-08 MED ORDER — SCOPOLAMINE 1 MG/3DAYS TD PT72
MEDICATED_PATCH | TRANSDERMAL | Status: AC
  Filled 2021-10-08: qty 1

## 2021-10-08 MED ORDER — NALOXONE HCL 0.4 MG/ML IJ SOLN: 0.4000 mg | INTRAMUSCULAR | Status: DC | PRN

## 2021-10-08 MED ORDER — MORPHINE SULFATE (PF) 0.5 MG/ML IJ SOLN
INTRAMUSCULAR | Status: AC
  Filled 2021-10-08: qty 10

## 2021-10-08 MED ORDER — DIPHENHYDRAMINE HCL 25 MG PO CAPS: 25.0000 mg | ORAL_CAPSULE | ORAL | Status: DC | PRN

## 2021-10-08 MED ORDER — DIPHENHYDRAMINE HCL 25 MG PO CAPS: 25.0000 mg | ORAL_CAPSULE | Freq: Four times a day (QID) | ORAL | Status: DC | PRN

## 2021-10-08 MED ORDER — TETANUS-DIPHTH-ACELL PERTUSSIS 5-2.5-18.5 LF-MCG/0.5 IM SUSY
0.5000 mL | PREFILLED_SYRINGE | Freq: Once | INTRAMUSCULAR | Status: AC
  Administered 2021-10-09: 0.5 mL via INTRAMUSCULAR
  Filled 2021-10-08: qty 0.5

## 2021-10-08 MED ORDER — DIPHENHYDRAMINE HCL 50 MG/ML IJ SOLN
12.5000 mg | INTRAMUSCULAR | Status: DC | PRN
  Administered 2021-10-08: 12.5 mg via INTRAVENOUS
  Filled 2021-10-08: qty 1

## 2021-10-08 MED ORDER — FENTANYL CITRATE (PF) 100 MCG/2ML IJ SOLN
50.0000 ug | INTRAMUSCULAR | Status: DC | PRN
  Administered 2021-10-08: 100 ug via INTRAVENOUS

## 2021-10-08 MED ORDER — MEPERIDINE HCL 25 MG/ML IJ SOLN: 6.2500 mg | INTRAMUSCULAR | Status: DC | PRN

## 2021-10-08 MED ORDER — OXYTOCIN-SODIUM CHLORIDE 30-0.9 UT/500ML-% IV SOLN
2.5000 [IU]/h | INTRAVENOUS | Status: AC
  Administered 2021-10-08: 2.5 [IU]/h via INTRAVENOUS

## 2021-10-08 MED ORDER — OXYCODONE HCL 5 MG PO TABS
5.0000 mg | ORAL_TABLET | ORAL | Status: DC | PRN
  Administered 2021-10-09 (×4): 10 mg via ORAL
  Administered 2021-10-10: 5 mg via ORAL
  Administered 2021-10-10: 10 mg via ORAL
  Administered 2021-10-10: 5 mg via ORAL
  Filled 2021-10-08 (×2): qty 2
  Filled 2021-10-08 (×2): qty 1
  Filled 2021-10-08: qty 2
  Filled 2021-10-08 (×2): qty 1
  Filled 2021-10-08: qty 2

## 2021-10-08 MED ORDER — TERBUTALINE SULFATE 1 MG/ML IJ SOLN: 0.2500 mg | Freq: Once | INTRAMUSCULAR | Status: DC | PRN

## 2021-10-08 MED ORDER — NALOXONE HCL 4 MG/10ML IJ SOLN: 1.0000 ug/kg/h | INTRAVENOUS | Status: DC | PRN

## 2021-10-08 MED ORDER — WITCH HAZEL-GLYCERIN EX PADS: 1.0000 | MEDICATED_PAD | CUTANEOUS | Status: DC | PRN

## 2021-10-08 MED ORDER — MORPHINE SULFATE (PF) 0.5 MG/ML IJ SOLN
INTRAMUSCULAR | Status: DC | PRN
  Administered 2021-10-08: 150 ug via INTRATHECAL

## 2021-10-08 MED ORDER — ZOLPIDEM TARTRATE 5 MG PO TABS: 5.0000 mg | ORAL_TABLET | Freq: Every evening | ORAL | Status: DC | PRN

## 2021-10-08 MED ORDER — DIBUCAINE (PERIANAL) 1 % EX OINT: 1.0000 | TOPICAL_OINTMENT | CUTANEOUS | Status: DC | PRN

## 2021-10-08 MED ORDER — ACETAMINOPHEN 500 MG PO TABS: 1000.0000 mg | ORAL_TABLET | Freq: Once | ORAL | Status: DC

## 2021-10-08 MED ORDER — MISOPROSTOL 25 MCG QUARTER TABLET
ORAL_TABLET | ORAL | Status: DC | PRN
  Administered 2021-10-08: 800 ug via RECTAL

## 2021-10-08 MED ORDER — ACETAMINOPHEN 325 MG PO TABS: 650.0000 mg | ORAL_TABLET | ORAL | Status: DC | PRN

## 2021-10-08 MED ORDER — SIMETHICONE 80 MG PO CHEW: 80.0000 mg | CHEWABLE_TABLET | ORAL | Status: DC | PRN

## 2021-10-08 SURGICAL SUPPLY — 39 items
BENZOIN TINCTURE PRP APPL 2/3 (GAUZE/BANDAGES/DRESSINGS) ×1 IMPLANT
CHLORAPREP W/TINT 26ML (MISCELLANEOUS) ×2 IMPLANT
CLAMP CORD UMBIL (MISCELLANEOUS) ×1 IMPLANT
CLOTH BEACON ORANGE TIMEOUT ST (SAFETY) ×1 IMPLANT
CLSR STERI-STRIP ANTIMIC 1/2X4 (GAUZE/BANDAGES/DRESSINGS) IMPLANT
DRSG INTERDRY AG 10X36 (GAUZE/BANDAGES/DRESSINGS) IMPLANT
DRSG OPSITE POSTOP 4X10 (GAUZE/BANDAGES/DRESSINGS) ×1 IMPLANT
ELECT REM PT RETURN 9FT ADLT (ELECTROSURGICAL) ×1
ELECTRODE REM PT RTRN 9FT ADLT (ELECTROSURGICAL) ×1 IMPLANT
EXTRACTOR VACUUM M CUP 4 TUBE (SUCTIONS) IMPLANT
GLOVE BIO SURGEON STRL SZ7.5 (GLOVE) ×1 IMPLANT
GLOVE BIOGEL PI IND STRL 7.0 (GLOVE) ×1 IMPLANT
GLOVE BIOGEL PI IND STRL 7.5 (GLOVE) ×1 IMPLANT
GOWN STRL REUS W/TWL LRG LVL3 (GOWN DISPOSABLE) ×2 IMPLANT
HEMOSTAT ARISTA ABSORB 3G PWDR (HEMOSTASIS) IMPLANT
KIT ABG SYR 3ML LUER SLIP (SYRINGE) IMPLANT
LIGASURE IMPACT 36 18CM CVD LR (INSTRUMENTS) IMPLANT
MAT PREVALON FULL STRYKER (MISCELLANEOUS) IMPLANT
NDL HYPO 25X5/8 SAFETYGLIDE (NEEDLE) IMPLANT
NEEDLE HYPO 25X5/8 SAFETYGLIDE (NEEDLE) IMPLANT
NS IRRIG 1000ML POUR BTL (IV SOLUTION) ×1 IMPLANT
PACK C SECTION WH (CUSTOM PROCEDURE TRAY) ×1 IMPLANT
PAD ABD DERMACEA PRESS 5X9 (GAUZE/BANDAGES/DRESSINGS) IMPLANT
PAD OB MATERNITY 4.3X12.25 (PERSONAL CARE ITEMS) ×1 IMPLANT
RTRCTR C-SECT PINK 25CM LRG (MISCELLANEOUS) ×1 IMPLANT
STRIP CLOSURE SKIN 1/2X4 (GAUZE/BANDAGES/DRESSINGS) ×1 IMPLANT
SUT CHROMIC 2 0 CT 1 (SUTURE) ×1 IMPLANT
SUT MNCRL 0 VIOLET CTX 36 (SUTURE) ×1 IMPLANT
SUT MNCRL AB 3-0 PS2 27 (SUTURE) ×1 IMPLANT
SUT MONOCRYL 0 CTX 36 (SUTURE) ×1
SUT PLAIN 2 0 XLH (SUTURE) ×1 IMPLANT
SUT VIC AB 0 CT1 36 (SUTURE) ×1 IMPLANT
SUT VIC AB 0 CTX 36 (SUTURE) ×3
SUT VIC AB 0 CTX36XBRD ANBCTRL (SUTURE) ×3 IMPLANT
SUT VIC AB 2-0 SH 27 (SUTURE)
SUT VIC AB 2-0 SH 27XBRD (SUTURE) IMPLANT
TOWEL OR 17X24 6PK STRL BLUE (TOWEL DISPOSABLE) ×1 IMPLANT
TRAY FOLEY W/BAG SLVR 14FR LF (SET/KITS/TRAYS/PACK) ×1 IMPLANT
WATER STERILE IRR 1000ML POUR (IV SOLUTION) ×1 IMPLANT

## 2021-10-08 NOTE — Progress Notes (Signed)
Pt CBG on Dexcom was 89

## 2021-10-08 NOTE — H&P (Addendum)
Kendra Nguyen is a 41 y.o. female presenting for IOL d/t LGA, polyhydramnios and multiple comorbities (T2DM on insulin, CHTN on procardia, AMA and increased BMI 64.9)  OB History     Gravida  2   Para  1   Term  1   Preterm      AB      Living  1      SAB      IAB      Ectopic      Multiple  0   Live Births  1          Past Medical History:  Diagnosis Date   Anemia    Anxiety    Depression    Diabetes (HCC)    Edema of both lower extremities    Hx of laparoscopic gastric banding    Hyperlipemia    Hypertension    was taken of HTN meds by PCP "years ago"   Morbid obesity with BMI of 45.0-49.9, adult ([place])    Obesity    OCD (obsessive compulsive disorder) 04/2016   PCOS (polycystic ovarian syndrome)    Varicose veins    Vitamin D deficiency    Past Surgical History:  Procedure Laterality Date   CESAREAN SECTION N/A 01/18/2016   Procedure: CESAREAN SECTION;  Surgeon: Everett Graff, MD;  Location: [place];  Service: Obstetrics;  Laterality: N/A;   COLONOSCOPY WITH PROPOFOL N/A 11/24/2014   Procedure: COLONOSCOPY WITH PROPOFOL;  Surgeon: Milus Banister, MD;  Location: [facility];  Service: Endoscopy;  Laterality: N/A;   LAPAROSCOPIC GASTRIC BANDING  04/17/2011   LAPAROSCOPIC GASTRIC SLEEVE RESECTION     2016   WISDOM TOOTH EXTRACTION     Family History: family history includes Aneurysm (age of onset: 59) in her sister; Arthritis in her father; COPD in her father and paternal aunt; Cancer in her father; Diabetes in her mother; Heart disease in her maternal grandfather, maternal grandmother, and mother; High blood pressure in her father; Hyperlipidemia in her father; Hypertension in her father and sister; Obesity in her mother; Sleep apnea in her father. Social History:  reports that she has never smoked. She has never used smokeless tobacco. She reports that she does not currently use alcohol. She reports that she does not use drugs.      Maternal Diabetes: Yes:  Diabetes Type:  Pre-pregnancy T2DM on insulin Genetic Screening: Normal low risk female Maternal Ultrasounds/Referrals: Other:see fetal echo below other normal Fetal Ultrasounds or other Referrals:  Fetal echo Perimembranous VSD Maternal Substance Abuse:  No Significant Maternal Medications:  Meds include: Other: Insulin, Procardia Significant Maternal Lab Results:  Other: GBS by PCR pending Number of Prenatal Visits:greater than 3 verified prenatal visits Other Comments:   SMA carrier, FOB not tested.  Review of Systems Denies F/C/N/V/D History   Height '5\' 6"'$  (1.676 m), weight (!) 184 kg, last menstrual period 07/14/2020. Exam Physical Exam  Lungs CTA  CV RRR Abdomen gravid, NT Extremities no calf tenderness  Prenatal labs: ABO, Rh: AB/Positive/-- (02/21 0000) Antibody: Negative (02/21 0000) Rubella: Immune (02/21 0000) RPR: Nonreactive (02/21 0000)  HBsAg: Negative (02/21 0000)  HIV: Non-reactive (02/21 0000)  GBS:   negative  Assessment/Plan: 41yo G2P1001 at 55 1/7wks admitted for IOL d/t LGA, polyhydramnios and multiple comorbities (T2DM on insulin, CHTN on procardia, AMA and increased BMI 64.9).  Upon ultrasound evaluation for presentation at admission, baby was found to be transverse, back down, head to maternal right.  Plan changed  to repeat c-section and now desires sterilization.  Risks benefits alternatives disussed with the pt including but not limited to bleeding infection injury risk of regret and risk of failure.  Questions answered and consent signed and witnessed.   Cat 1 tracing reviewed remotely by me at 0815.  [name] 10/08/2021, 7:02 AM

## 2021-10-08 NOTE — Op Note (Addendum)
Cesarean Section Procedure Note  Indications: P1 at 48 1/7wks admitted for IOL and found to have transverse lie so pt was agreeable to repeat c-section.  Pre-operative Diagnosis: 1.37 1/7wks 2. Repeat Cesarean Section 3.Transverse Lie 4.Desires Permanent Sterilization   Post-operative Diagnosis: 1.37 1/7wks 2.Repeat cesarean section 3.Desires Permanent Sterilization  Procedure: Repeat Low Transverse Cesarean Section  Surgeon: Everett Graff, MD    Assistants: Dr. Simmie Davies, [place] Fellow  Anesthesia: Regional  Anesthesiologist: Dr. Duane Boston   Procedure Details  The patient was taken to the operating room after the risks, benefits, complications, treatment options, and expected outcomes were discussed with the patient.  The patient concurred with the proposed plan, giving informed consent which was signed and witnessed. The patient was taken to Operating Room B, identified as Kendra Nguyen and the procedure verified as C-Section Delivery. A Time Out was held and the above information confirmed.  After induction of anesthesia by obtaining a surgical level via the spinal, the patient was prepped and draped in the usual sterile manner. A Pfannenstiel skin incision was made and carried down through the subcutaneous tissue to the underlying layer of fascia.  The fascia was incised bilaterally and extended transversely bilaterally with the Mayo scissors. Kocher clamps were placed on the inferior aspect of the fascial incision and the underlying rectus muscle was separated from the fascia. The same was done on the superior aspect of the fascial incision.  The peritoneum was identified, entered bluntly and extended manually. An Alexis self-retaining retractor was placed.  The utero-vesical peritoneal reflection was incised transversely and the bladder flap was bluntly freed from the lower uterine segment. A low transverse uterine incision was made with the scalpel and extended bilaterally  with the bandage scissors.  The infant was delivered in vertex position without difficulty. After the umbilical cord was clamped and cut, the infant was handed to the awaiting pediatricians.  Cord blood was obtained for evaluation.  The placenta was removed intact and appeared to be within normal limits. The uterus was cleared of all clots and debris. The uterine incision was closed with running interlocking sutures of 0 Vicryl and a second imbricating layer with 0 monocryl was performed as well.   Bilateral tubes and ovaries appeared to be within normal limits.  Good hemostasis was noted.  Copious irrigation was performed until clear.  The uterus was elevated to the incision and the left fallopian tube grasped in the midportion with a babcock after carrying it out to its fimbriated end and excised with the ligasure.  The same was done on the contralateral side.  The peritoneum was repaired with 2-0 chromic via a running suture.  The fascia was reapproximated by the [facility] fellow with a running suture of 0 Vicryl while I assisted. The subcutaneous tissue was reapproximated with 3 interrupted sutures of 2-0 plain by me.  The skin was reapproximated with a subcuticular suture of 3-0 monocryl by the OB fellow while I assisted.  Steristrips were applied.  Instrument, sponge, and needle counts were correct prior to abdominal closure and at the conclusion of the case.  The patient was awaiting transfer to the recovery room in good condition.  Findings: Live female infant with Apgars 8 at one minute and 9 at five minutes.  Normal appearing bilateral ovaries and fallopian tubes were noted.  Estimated Blood Loss:  1409 ml         Drains: foley to gravity 100 cc clear urine  Total IV Fluids: 1500 ml         Specimens to Pathology: Placenta and Bilateral Portion of Tubes         Complications:  None; patient tolerated the procedure well.         Disposition: PACU - hemodynamically stable.          Condition: stable  Attending Attestation: I performed the procedure.  I was present and scrubbed and the assistant was required due to complexity of anatomy.  An experienced assistant was required given the standard of surgical care given the complexity of the case.  This assistant was needed for exposure, dissection, suctioning, retraction, instrument exchange, assisting with delivery with administration of fundal pressure, and for overall help during the procedure.

## 2021-10-08 NOTE — Progress Notes (Addendum)
I was asked to assist Dr. Everett Graff with rLTCS with b/l salpingectomy on 10/08/21.   An experienced assistant was required given the standard of surgical care given the complexity of the case.  This assistant was needed for exposure, dissection, suctioning, retraction, instrument exchange, assisting with delivery with administration of fundal pressure, and for overall help during the procedure.  Shelda Pal, [place] Fellow, Faculty practice [place] for [facility] 10/08/21  2:17 PM

## 2021-10-08 NOTE — Anesthesia Postprocedure Evaluation (Signed)
Anesthesia Post Note  Patient: Kendra Nguyen  Procedure(s) Performed: CESAREAN SECTION     Patient location during evaluation: PACU Anesthesia Type: Spinal Level of consciousness: awake and alert Pain management: pain level controlled Vital Signs Assessment: post-procedure vital signs reviewed and stable Respiratory status: spontaneous breathing and respiratory function stable Cardiovascular status: blood pressure returned to baseline and stable Postop Assessment: spinal receding Anesthetic complications: no   No notable events documented.  Last Vitals:  Vitals:   10/08/21 1500 10/08/21 1515  BP: (!) 108/90 110/62  Pulse: 72 65  Resp: 10 15  Temp:    SpO2: 100% 96%    Last Pain:  Vitals:   10/08/21 1433  TempSrc: Oral   Pain Goal:    LLE Motor Response: Purposeful movement (10/08/21 1500) LLE Sensation: Tingling (10/08/21 1500) RLE Motor Response: Purposeful movement (10/08/21 1500) RLE Sensation: Tingling (10/08/21 1500)     Epidural/Spinal Function Cutaneous sensation: Tingles (10/08/21 1500), Patient able to flex knees: Yes (10/08/21 1500), Patient able to lift hips off bed: No (10/08/21 1500), Back pain beyond tenderness at insertion site: No (10/08/21 1500), Progressively worsening motor and/or sensory loss: No (10/08/21 1500), Bowel and/or bladder incontinence post epidural: No (10/08/21 1500)  Keyshaun Exley DANIEL

## 2021-10-08 NOTE — Transfer of Care (Signed)
Immediate Anesthesia Transfer of Care Note  Patient: Kendra Nguyen  Procedure(s) Performed: CESAREAN SECTION  Patient Location: PACU  Anesthesia Type:Spinal  Level of Consciousness: awake, alert , oriented and patient cooperative  Airway & Oxygen Therapy: Patient Spontanous Breathing  Post-op Assessment: Report given to RN and Post -op Vital signs reviewed and stable  Post vital signs: Reviewed and stable  Last Vitals:  Vitals Value Taken Time  BP 108/63 10/08/21 1433  Temp    Pulse 78 10/08/21 1438  Resp 18 10/08/21 1438  SpO2 99 % 10/08/21 1438  Vitals shown include unvalidated device data.  Last Pain:  Vitals:   10/08/21 1157  TempSrc: Oral         Complications: No notable events documented.

## 2021-10-08 NOTE — Anesthesia Preprocedure Evaluation (Addendum)
Anesthesia Evaluation  Patient identified by MRN, date of birth, ID band Patient awake    Reviewed: Allergy & Precautions, NPO status , Patient's Chart, lab work & pertinent test results  History of Anesthesia Complications Negative for: history of anesthetic complications  Airway Mallampati: II  TM Distance: >3 FB Neck ROM: Full    Dental no notable dental hx. (+) Dental Advisory Given   Pulmonary neg pulmonary ROS,    Pulmonary exam normal        Cardiovascular hypertension, Pt. on medications Normal cardiovascular exam     Neuro/Psych PSYCHIATRIC DISORDERS Anxiety Depression negative neurological ROS     GI/Hepatic negative GI ROS, Neg liver ROS,   Endo/Other  diabetes  Renal/GU negative Renal ROS     Musculoskeletal negative musculoskeletal ROS (+)   Abdominal   Peds  Hematology  (+) Blood dyscrasia, anemia ,   Anesthesia Other Findings   Reproductive/Obstetrics (+) Pregnancy                            Anesthesia Physical Anesthesia Plan  ASA: 3  Anesthesia Plan: Spinal   Post-op Pain Management: Tylenol PO (pre-op)*   Induction:   PONV Risk Score and Plan: 2  Airway Management Planned: Natural Airway  Additional Equipment:   Intra-op Plan:   Post-operative Plan:   Informed Consent: I have reviewed the patients History and Physical, chart, labs and discussed the procedure including the risks, benefits and alternatives for the proposed anesthesia with the patient or authorized representative who has indicated his/her understanding and acceptance.     Dental advisory given  Plan Discussed with: Anesthesiologist  Anesthesia Plan Comments:        Anesthesia Quick Evaluation

## 2021-10-08 NOTE — Anesthesia Procedure Notes (Addendum)
Spinal  Patient location during procedure: OR Start time: 10/08/2021 12:18 PM End time: 10/08/2021 12:26 PM Reason for block: surgical anesthesia Staffing Performed: anesthesiologist  Anesthesiologist: Duane Boston, MD Performed by: Duane Boston, MD Authorized by: Duane Boston, MD   Preanesthetic Checklist Completed: patient identified, IV checked, risks and benefits discussed, surgical consent, monitors and equipment checked, pre-op evaluation and timeout performed Spinal Block Patient position: sitting Prep: DuraPrep Patient monitoring: cardiac monitor, continuous pulse ox and blood pressure Approach: midline Location: L2-3 Injection technique: single-shot Needle Needle type: Pencan  Needle gauge: 24 G Needle length: 9 cm Assessment Events: CSF return Additional Notes Functioning IV was confirmed and monitors were applied. Sterile prep and drape, including hand hygiene and sterile gloves were used. The patient was positioned and the spine was prepped. The skin was anesthetized with lidocaine.  Free flow of clear CSF was obtained prior to injecting local anesthetic into the CSF.  The spinal needle aspirated freely following injection.  The needle was carefully withdrawn.  The patient tolerated the procedure well.

## 2021-10-09 LAB — CBC
HCT: 26.2 % — ABNORMAL LOW (ref 36.0–46.0)
Hemoglobin: 8.8 g/dL — ABNORMAL LOW (ref 12.0–15.0)
MCH: 28.6 pg (ref 26.0–34.0)
MCHC: 33.6 g/dL (ref 30.0–36.0)
MCV: 85.1 fL (ref 80.0–100.0)
Platelets: 193 10*3/uL (ref 150–400)
RBC: 3.08 MIL/uL — ABNORMAL LOW (ref 3.87–5.11)
RDW: 15.9 % — ABNORMAL HIGH (ref 11.5–15.5)
WBC: 11.1 10*3/uL — ABNORMAL HIGH (ref 4.0–10.5)
nRBC: 0 % (ref 0.0–0.2)

## 2021-10-09 LAB — BASIC METABOLIC PANEL
Anion gap: 7 (ref 5–15)
BUN: 5 mg/dL — ABNORMAL LOW (ref 6–20)
CO2: 22 mmol/L (ref 22–32)
Calcium: 8.5 mg/dL — ABNORMAL LOW (ref 8.9–10.3)
Chloride: 108 mmol/L (ref 98–111)
Creatinine, Ser: 0.64 mg/dL (ref 0.44–1.00)
GFR, Estimated: >60 mL/min (ref >60)
Glucose, Bld: 111 mg/dL — ABNORMAL HIGH (ref 70–99)
Potassium: 4.1 mmol/L (ref 3.5–5.1)
Sodium: 137 mmol/L (ref 135–145)

## 2021-10-09 LAB — GLUCOSE, CAPILLARY: Glucose-Capillary: 114 mg/dL — ABNORMAL HIGH (ref 70–99)

## 2021-10-09 NOTE — Social Work (Signed)
CSW received consult due to score 11 on Edinburgh Depression Screen. CSW met with MOB to complete an assessment. CSW entered the room, introduced self, CSW role and reason for visit. MOB was agreeable to visit. CSW observed MOB in bed and FOB in the chair next to her. MOB gave CSW permission to speak with her spouse present. CSW inquired about the infant whereabouts, MOB reported she was in the NICU due to her glucose levels dropping. CSW inquired about how MOB was doing regarding the infant being in the NICU, MOB reported their son went through the same thing with his Glucose dropping but never had to go to the NICU. MOB reported she isn't too concerned with the Glucose but would feel better if she was in the room. CSW voiced understanding. CSW inquired about MOB feeling panicky and anxious within the last 7 days, MOB stated " I have OCD and I have been off my medication"  MOB reported she stopped taking her medication after she found out she was pregnant but plans to restart medication now that she has delivered. MOB is currently prescribed Fluoxetine, Abilify, and an ADHD med but she could not recall the name. CSW inquired about MOB's mood during pregnancy since she was not taking her medications. MOB stated it was stressful but she kept seeing her therapist Tara Truesdale. In order to cope she used techniques to differentiate things she could control and things she could not control.  MOB participated in activities she enjoyed to get her mind off of the anxiety. CSW encouraged MOB to remain active with her therapist and consistent on her medication since that has been working for her, MOB agreed. CSW assessed for safety, MOB denied any SI or HI. MOB identified her spouse as her support. CSW provided education regarding Baby Blues vs PMADs and provided MOB with resources for mental health follow up.  CSW encouraged MOB to evaluate her mental health throughout the postpartum period with the use of the New Mom  Checklist developed by [facility]partum [employer] as well as the Edinburgh Postnatal Depression Scale and notify a medical professional if symptoms arise.    CSW provided review of Sudden Infant Death Syndrome (SIDS) precautions. Mob identified [facility] for infants follow up care. MOB reported they have all necessary items for the infant including a crib and bassinet for her to sleep. CSW identifies no further need for intervention and no barriers to discharge at this time.    Marlen Mollica, LCSWA Clinical Social Worker [phone] 

## 2021-10-09 NOTE — Lactation Note (Signed)
This note was copied from a baby's chart.  NICU Lactation Consultation Note  Patient Name: Kendra Nguyen OTRRN'H Date: 10/09/2021 Age:41 hours  Subjective Reason for consult: Initial assessment; NICU baby; Other (Comment); Maternal endocrine disorder; Early term 85-38.6wks (LGA, gastric sleeve, AMA)  Visited with family of 39 hours old ETI NICU female, Ms. Colwell is a P2 but she doesn't have ample experience breastfeeding. This [place] set her up with a pump at 24 hours post-partum, baby was sent to the NICU this morning. Reviewed pumping schedule, lactogenesis II and anticipatory guidelines.  Objective Infant data: Mother's Current Feeding Choice: Breast Milk and Formula  Infant feeding assessment Scale for Readiness: 1 Scale for Quality: 2  Maternal data: G2P2002  C-Section, Low Transverse Significant Breast History:: moderate breast changes during the pregnancy Current breast feeding challenges:: NICU admission Previous breastfeeding challenges?: Low milk supply Does the patient have breastfeeding experience prior to this delivery?: Yes How long did the patient breastfeed?: 2 weeks Pumping frequency: q 3 hours (recommended) Pumped volume: 0 mL Flange Size: 24 Risk factor for low milk supply:: PCOS, infant separation, DM2 Pump: Hands Free  Assessment Infant: LATCH Score: 7  Maternal: Milk volume: Normal  Intervention/Plan Interventions: Breast feeding basics reviewed; DEBP; Education Tools: Pump; Flanges Pump Education: Setup, frequency, and cleaning; Milk Storage  Plan of care: Encouraged pumping every 3 hours, ideally 8 pumping sessions/24 hours Breast massage and hand expression were also encouraged prior   FOB present. All questions and concerns answered, family to contact [facility] services PRN.  Consult Status: NICU follow-up  NICU Follow-up type: New admission follow up; Maternal D/C visit; Verify onset of copious milk; Verify absence of  engorgement   Yassen Kinnett S Jonuel Butterfield 10/09/2021, 1:37 PM

## 2021-10-09 NOTE — Progress Notes (Signed)
Went to see pt and she was not in the room. She is in NICU with the baby

## 2021-10-10 ENCOUNTER — Encounter (HOSPITAL_COMMUNITY): Payer: Self-pay | Admitting: Obstetrics and Gynecology

## 2021-10-10 LAB — SURGICAL PATHOLOGY

## 2021-10-10 MED ORDER — INSULIN DETEMIR 100 UNIT/ML ~~LOC~~ SOLN: 28.0000 [IU] | Freq: Every day | SUBCUTANEOUS | Status: DC

## 2021-10-10 MED ORDER — INSULIN DETEMIR 100 UNIT/ML ~~LOC~~ SOLN
30.0000 [IU] | Freq: Every day | SUBCUTANEOUS | Status: DC
  Administered 2021-10-11: 30 [IU] via SUBCUTANEOUS
  Filled 2021-10-10: qty 0.3

## 2021-10-10 MED ORDER — FERROUS SULFATE 325 (65 FE) MG PO TABS
325.0000 mg | ORAL_TABLET | Freq: Two times a day (BID) | ORAL | Status: DC
  Administered 2021-10-11: 325 mg via ORAL
  Filled 2021-10-10: qty 1

## 2021-10-10 MED ORDER — INSULIN DETEMIR 100 UNIT/ML ~~LOC~~ SOLN
5.0000 [IU] | Freq: Once | SUBCUTANEOUS | Status: AC
  Administered 2021-10-10: 5 [IU] via SUBCUTANEOUS
  Filled 2021-10-10: qty 0.05

## 2021-10-10 NOTE — Progress Notes (Signed)
Post Partum Day 2 Subjective: Pt is not in the room, she is in the NICU.   Went to NICU to see pt.  She is doing well just appropriately concerned about the baby. + flatus.  Objective: Blood pressure (!) 96/55, pulse 65, temperature 98.3 F (36.8 C), temperature source Oral, resp. rate 18, height '5\' 6"'$  (1.676 m), weight (!) 184 kg, last menstrual period 07/14/2020, SpO2 100 %, unknown if currently breastfeeding.  Physical Exam:  General: alert and no distress Lochia: appropriate Uterine Fundus:deferred Incision: deferred (pt says dressing is c/d/I) DVT Evaluation: no calf tenderness  Recent Labs    10/08/21 0708 10/09/21 0507  HGB 10.9* 8.8*  HCT 32.4* 26.2*    Assessment/Plan: Breastfeeding T2DM on levemir.  Appreciate DM consultation and recs.  Will increase levemir to 30u daily and try to avoid multiple sticks. Cont checking CBGs.  May need to increase to BID. CHTN BP well controlled but a little too low although pt is asymptomatic. Hold BP med for SBP <120 or DBP <70. Ambulating without difficulty Voiding spontaneously Tolerating po   LOS: 2 days   [name], MD 10/10/2021, 2:47 PM

## 2021-10-10 NOTE — Inpatient Diabetes Management (Signed)
Inpatient Diabetes Program Recommendations  AACE/ADA: New Consensus Statement on Inpatient Glycemic Control (2015)  Target Ranges:  Prepandial:   less than 140 mg/dL      Peak postprandial:   less than 180 mg/dL (1-2 hours)      Critically ill patients:  140 - 180 mg/dL   Lab Results  Component Value Date   GLUCAP 114 (H) 10/09/2021   HGBA1C 6.5 05/24/2020    Review of Glycemic Control  Latest Reference Range & Units 10/09/21 06:15  Glucose-Capillary 70 - 99 mg/dL 114 (H)  (H): Data is abnormally high Diabetes history: Type 2 DM Outpatient Diabetes medications: Levemir 54 units QA, 60 units QHS, Novolog 12-18 units TID Current orders for Inpatient glycemic control: Levemir 25 units QD Decadron 4 mg x 1 (9/18)  Inpatient Diabetes Program Recommendations:    Consider: - Increasing Levemir 28 units QD - adding Novolog 0-9 units TID & HS  Thanks, Bronson Curb, MSN, RNC-OB Diabetes Coordinator [phone] (8a-5p)

## 2021-10-10 NOTE — Lactation Note (Signed)
This note was copied from a baby's chart. Lactation Consultation Note  Patient Name: Kendra Nguyen DGUYQ'I Date: 10/10/2021 Reason for consult: Follow-up assessment;NICU baby;Breastfeeding assistance;Other (Comment);Maternal endocrine disorder;Early term 37-38.6wks (LGA, gastric sleeve, AMA) Age:41 hours  Visited with family twice, to check on pumping status and also to assist with the 3 pm feeding. Kendra Nguyen has been pumping but not consistently. Explained the importance of consistent pumping for the onset of lactogenesis II, she voiced understanding. She requested assistance for this feeding, baby is now on ad lib status, but still receiving glucose via IV due to hypoglycemia. She's been taking bottles; between 26-55 ml per feeding. Assisted with hand expression, Kendra Nguyen is able to easily get colostrum, praised her for her efforts. This LC took baby to the L breast in cross cradle hold (due to IV placement) and she was able to latch with ease (see LATCH score) but fell asleep at the breast after a 6 minutes feeding. Reviewed feeding plan, newborn hypoglycemia, pumping schedule, lactogenesis II and anticipatory guidelines.  Feeding Mother's Current Feeding Choice: Breast Milk and Formula Nipple Type: Slow - flow  LATCH Score Latch: Grasps breast easily, tongue down, lips flanged, rhythmical sucking. (but required constant stimulation for sucking)  Audible Swallowing: None (NNS was the predominant sucking pattern during this feeding)  Type of Nipple: Everted at rest and after stimulation  Comfort (Breast/Nipple): Soft / non-tender  Hold (Positioning): Assistance needed to correctly position infant at breast and maintain latch.  LATCH Score: 7  Lactation Tools Discussed/Used Tools: Pump;Flanges Flange Size: 24 Breast pump type: Double-Electric Breast Pump Pump Education: Setup, frequency, and cleaning;Milk Storage Reason for Pumping: ETI in NICU Pumping frequency: 2 times/24  hours Pumped volume:  (drops)  Interventions Interventions: Breast feeding basics reviewed;Assisted with latch;Breast massage;Hand express;Breast compression;DEBP;Education  Plan of care Encouraged pumping every 3 hours, ideally 8 pumping sessions/24 hours Breast massage and hand expression were also encouraged prior  She'll continue taking baby to breast on feeding cues, L breast in cross cradle and R breast in football hold until removal of IV Parents will continue supplementing baby with Similac 24 calorie formula right after feedings at the breast, they understands she still need to get her volumes/calories for glycemic control   FOB present and supportive. All questions and concerns answered, family to contact [facility] services PRN.   Discharge Pump: Hands Free  Consult Status Consult Status: NICU follow-up Date: 10/10/21 Follow-up type: In-patient   Kendra Nguyen 10/10/2021, 3:32 PM

## 2021-10-11 LAB — GLUCOSE, CAPILLARY: Glucose-Capillary: 72 mg/dL (ref 70–99)

## 2021-10-11 MED ORDER — OXYCODONE HCL 5 MG PO TABS: 5.0000 mg | ORAL_TABLET | ORAL | 0 refills | Status: DC | PRN

## 2021-10-11 MED ORDER — INSULIN DETEMIR 100 UNIT/ML ~~LOC~~ SOLN: 25.0000 [IU] | Freq: Every morning | SUBCUTANEOUS | 11 refills | Status: DC

## 2021-10-11 NOTE — Inpatient Diabetes Management (Addendum)
Inpatient Diabetes Program Recommendations  AACE/ADA: New Consensus Statement on Inpatient Glycemic Control (2015)  Target Ranges:  Prepandial:   less than 140 mg/dL      Peak postprandial:   less than 180 mg/dL (1-2 hours)      Critically ill patients:  140 - 180 mg/dL   Lab Results  Component Value Date   GLUCAP 72 10/11/2021   HGBA1C 6.5 05/24/2020    Review of Glycemic Control  Diabetes history: Type 2 DM Outpatient Diabetes medications: Levemir 54 units QA, 60 units QHS, Novolog 12-18 units TID Current orders for Inpatient glycemic control: Levemir 30 units QD Decadron 4 mg x 1 (9/18)   Inpatient Diabetes Program Recommendations:   Spoke with Dr. Mancel Bale regarding diabetes management @ home. Re: decrease Levemir to 25 units qd.  Spoke with patient regarding pre pregnancy. Patient states she has been on Ozempic for weight loss, but was gestational diabetes. Patient has appointment with endocrinologist Dr. Chalmers Cater on 10/18/21 but agrees to send note in my chart or call office to update she is being discharged home today. Patient is wearing Dexcom sensor and reviewed with patient fasting CBG range 70-130 and post prandial 1-2 hrs. Post meal is <180. Patient verbalized hypoglycemia protocol and understands to followup with Dr. Chalmers Cater if fasting CBG consistently <90 to adjust insulin dose.  Thank you, Nani Gasser. Marquese Burkland, RN, MSN, CDE  Diabetes Coordinator Inpatient Glycemic Control Team Team Pager [phone] (8am-5pm) 10/11/2021 9:18 AM

## 2021-10-11 NOTE — Discharge Summary (Addendum)
Postpartum Discharge Summary    Patient Name: Kendra Nguyen DOB: Jan 23, 1980 MRN: [id number]  Date of admission: 10/08/2021 Delivery date:10/08/2021  Delivering provider: Everett Graff  Date of discharge: 10/11/2021  Admitting diagnosis: Encounter for induction of labor [Z34.90] Status post repeat low transverse cesarean section [Z98.891] Intrauterine pregnancy: [redacted]w[redacted]d    Secondary diagnosis:  Principal Problem:   Encounter for induction of labor Active Problems:   Status post repeat low transverse cesarean section  Additional problems:  Large for gestational age fetus Polyhydramnios Type 2 DM on insulin Chronic Hypertension Advanced maternal age Morbid obesity with BMI 64 1 prior cesarean section.    Discharge diagnosis: Term Pregnancy Delivered, CHTN, Type 2 DM, and PPH                                              Post partum procedures: Bilateral tubal ligation.  Augmentation: N/A Complications: [id number] Hospital course: Induction of Labor With Cesarean Section   41y.o. yo G2P2002 at 349w1das admitted to the hospital 10/08/2021 for induction of labor. Patient had a labor course significant for Transverse fetal presentation. The patient went for cesarean section due to Elective Repeat and Malpresentation. Delivery details are as follows: Membrane Rupture Time/Date: 1:00 PM ,10/08/2021   Delivery Method:C-Section, Low Transverse  Details of operation can be found in separate operative Note.  Patient had an uncomplicated postpartum course. She is ambulating, tolerating a regular diet, passing flatus, and urinating well.  Patient is discharged home in stable condition on 10/11/21.     Newborn Data: Birth date:10/08/2021  Birth time:1:01 PM  Gender:Female  Living status:Living  Apgars:8 ,9  Weight:4510 g                                Physical exam  Vitals:   10/10/21 2224 10/11/21 0515 10/11/21 1011 10/11/21 1057  BP: 125/70 122/67 137/86 128/82  Pulse:  82 82 84 92  Resp: '18 18 16   '$ Temp:  98.5 F (36.9 C)    TempSrc: Oral Oral    SpO2: 100% 99%    Weight:      Height:       General: alert, cooperative, and no distress Lochia: appropriate Uterine Fundus: firm Incision: Dressing is clean, dry, and intact DVT Evaluation: No evidence of DVT seen on physical exam. Calf/Ankle edema is present Labs: Lab Results  Component Value Date   WBC 11.1 (H) 10/09/2021   HGB 8.8 (L) 10/09/2021   HCT 26.2 (L) 10/09/2021   MCV 85.1 10/09/2021   PLT 193 10/09/2021      Latest Ref Rng & Units 10/09/2021    5:07 AM 10/08/2021    7:08 AM 05/24/2020    9:14 AM  CBC  WBC 4.0 - 10.5 K/uL 11.1  10.0  6.4   Hemoglobin 12.0 - 15.0 g/dL 8.8  10.9  12.6   Hematocrit 36.0 - 46.0 % 26.2  32.4  36.9   Platelets 150 - 400 K/uL 193  240  273.0        Latest Ref Rng & Units 10/09/2021    5:07 AM  CMP  Glucose 70 - 99 mg/dL 111   BUN 6 - 20 mg/dL 5   Creatinine 0.44 - 1.00 mg/dL 0.64   Sodium 135 -  145 mmol/L 137   Potassium 3.5 - 5.1 mmol/L 4.1   Chloride 98 - 111 mmol/L 108   CO2 22 - 32 mmol/L 22   Calcium 8.9 - 10.3 mg/dL 8.5    Edinburgh Score:    10/08/2021   11:15 PM  Edinburgh Postnatal Depression Scale Screening Tool  I have been able to laugh and see the funny side of things. 0  I have looked forward with enjoyment to things. 1  I have blamed myself unnecessarily when things went wrong. 2  I have been anxious or worried for no good reason. 2  I have felt scared or panicky for no good reason. 2  Things have been getting on top of me. 1  I have been so unhappy that I have had difficulty sleeping. 1  I have felt sad or miserable. 1  I have been so unhappy that I have been crying. 1  The thought of harming myself has occurred to me. 0  Edinburgh Postnatal Depression Scale Total 11   After visit meds:  Allergies as of 10/11/2021       Reactions   Tape    rash   Neomycin Rash   Other Itching, Rash        Medication List      STOP taking these medications    aspirin EC 81 MG tablet   insulin aspart 100 UNIT/ML injection Commonly known as: novoLOG       TAKE these medications    ferrous sulfate 325 (65 FE) MG tablet Take 325 mg by mouth daily with breakfast.   insulin detemir 100 UNIT/ML injection Commonly known as: LEVEMIR Inject 0.25 mLs (25 Units total) into the skin in the morning. What changed:  how much to take additional instructions   NIFEdipine 30 MG 24 hr tablet Commonly known as: PROCARDIA-XL/NIFEDICAL-XL Take 30 mg by mouth daily.   oxyCODONE 5 MG immediate release tablet Commonly known as: Oxy IR/ROXICODONE Take 1-2 tablets (5-10 mg total) by mouth every 4 (four) hours as needed for moderate pain.   prenatal multivitamin Tabs tablet Take 1 tablet by mouth daily at 12 noon.   VITAMIN D PO Take by mouth.       Discharge home in stable condition Infant Feeding: Breast Infant Disposition:NICU due to hypoglycemia.  Discharge instruction: per After Visit Summary and Postpartum booklet. Activity: Advance as tolerated. Pelvic rest for 6 weeks.  Diet: carb modified diet Future Appointments:No future appointments. Follow up Visit:  Follow-up [place]. Schedule an appointment as soon as possible for a visit in 1 week(s).   Why: Blood pressure check and fingerstick glucose review. Contact information: [address] [place] [address] [phone]        [facility]. Schedule an appointment as soon as possible for a visit in 6 week(s).   Why: 6 week postpartum check. Contact information: [address] [place] [address] [phone]               Anticipated Birth Control:  BTL done PP  She was advised to check and record her fasting, pre-prandial and bedtime fingerstick glucose values.    10/11/2021 Archie Endo, MD

## 2021-10-15 ENCOUNTER — Telehealth: Payer: Self-pay | Admitting: *Deleted

## 2021-10-15 NOTE — Telephone Encounter (Signed)
Called patient in reference to recent hospital visit. Patient was d/c from [facility] and Child center on 10/11/2021. Patient has seen her GYN for post op C-section appt.   Patient verbalize no questions or concerns, she will make an appt to see Dr. Mitchel Honour after she finishes all her GYN appt.

## 2021-10-16 ENCOUNTER — Telehealth (HOSPITAL_COMMUNITY): Payer: Self-pay | Admitting: *Deleted

## 2021-10-16 NOTE — Telephone Encounter (Signed)
Mom reports feeling good. Incision healing well per mom. In OB office for incision check yesterday. No concerns regarding herself at this time. Feeling well emotionally now. (hospital score=11) Mom reports baby is well. Feeding, peeing, and pooping without difficulty. Reviewed safe sleep. Mom has no concerns about baby at present.  Odis Hollingshead, RN 10-16-2021 at 2:14pm

## 2022-04-06 ENCOUNTER — Ambulatory Visit
Admission: RE | Admit: 2022-04-06 | Discharge: 2022-04-06 | Disposition: A | Discharge status: Home / Self Care | Payer: BC Managed Care – PPO | Source: Ambulatory Visit

## 2022-04-06 VITALS — BP 125/84 | HR 76 | Temp 98.3°F | Resp 16

## 2022-04-06 DIAGNOSIS — L03032 Cellulitis of left toe: Secondary | ICD-10-CM | POA: Diagnosis not present

## 2022-04-06 DIAGNOSIS — E1165 Type 2 diabetes mellitus with hyperglycemia: Secondary | ICD-10-CM

## 2022-04-06 MED ORDER — CEPHALEXIN 500 MG PO CAPS: 500.0000 mg | ORAL_CAPSULE | Freq: Four times a day (QID) | ORAL | 0 refills | Status: AC

## 2022-04-06 NOTE — ED Provider Notes (Signed)
[name]    CSN: [id number] Arrival date & time: 04/06/22  0904      History   Chief Complaint Chief Complaint  Patient presents with   Toe Pain    It appears that my cuticle is infected on my big toe and I'm having pain. There is swelling in the toe as well. - Entered by patient    HPI Kendra Nguyen is a 42 y.o. female.   42yo female with hx of T2DM presents today with concern of L great toe pain. Since Wednesday, has noted redness and swelling to the great toe. Initially thought it was related to new tennis shoes rubbing on her toe. However, over the past few days she has also noted some discharge around the nail. Has hx of losing her toenail on this same toe in the past, new nail that grew back in appears different. Pt reports hx of DM, avg glucose 170s. Pt admits to carb craving and emotional eating, primarily sweets. Taking ozempic daily.    Toe Pain    Past Medical History:  Diagnosis Date   Anemia    Anxiety    Depression    Diabetes ([place])    Edema of both lower extremities    Hx of laparoscopic gastric banding    Hyperlipemia    Hypertension    was taken of HTN meds by PCP "years ago"   Morbid obesity with BMI of 45.0-49.9, adult (HCC)    Obesity    OCD (obsessive compulsive disorder) 04/2016   PCOS (polycystic ovarian syndrome)    Varicose veins    Vitamin D deficiency     Patient Active Problem List   Diagnosis Date Noted   Encounter for induction of labor 10/08/2021   Status post repeat low transverse cesarean section 10/08/2021   Type 2 diabetes mellitus with hyperglycemia, without long-term current use of insulin ([place]) 10/31/2020   Left leg swelling 05/24/2020   History of PCOS 05/24/2020   Prediabetes 05/24/2020   S/P gastric bypass 12/18/2014   Hereditary and idiopathic peripheral neuropathy 06/07/2014   Pure hypercholesterolemia 12/15/2013   Vitamin D deficiency 12/15/2013   Morbid obesity ([place]) 03/21/2011    Past Surgical  History:  Procedure Laterality Date   CESAREAN SECTION N/A 01/18/2016   Procedure: CESAREAN SECTION;  Surgeon: Everett Graff, MD;  Location: [facility];  Service: Obstetrics;  Laterality: N/A;   CESAREAN SECTION N/A 10/08/2021   Procedure: CESAREAN SECTION;  Surgeon: Everett Graff, MD;  Location: [facility];  Service: Obstetrics;  Laterality: N/A;   COLONOSCOPY WITH PROPOFOL N/A 11/24/2014   Procedure: COLONOSCOPY WITH PROPOFOL;  Surgeon: Milus Banister, MD;  Location: [facility];  Service: Endoscopy;  Laterality: N/A;   LAPAROSCOPIC GASTRIC BANDING  04/17/2011   LAPAROSCOPIC GASTRIC SLEEVE RESECTION     2016   WISDOM TOOTH EXTRACTION      OB History     Gravida  2   Para  2   Term  2   Preterm      AB      Living  2      SAB      IAB      Ectopic      Multiple  0   Live Births  2            Home Medications    Prior to Admission medications   Medication Sig Start Date End Date Taking? Authorizing Provider  cephALEXin (KEFLEX) 500 MG  capsule Take 1 capsule (500 mg total) by mouth 4 (four) times daily for 7 days. 04/06/22 04/13/22 Yes Blyss Lugar L, PA  ferrous sulfate 325 (65 FE) MG tablet Take 325 mg by mouth daily with breakfast.    [provider]  NIFEdipine (PROCARDIA-XL/NIFEDICAL-XL) 30 MG 24 hr tablet Take 30 mg by mouth daily.    [provider]  oxyCODONE (OXY IR/ROXICODONE) 5 MG immediate release tablet Take 1-2 tablets (5-10 mg total) by mouth every 4 (four) hours as needed for moderate pain. 10/11/21   Waymon Amato, MD  OZEMPIC, 0.25 OR 0.5 MG/DOSE, 2 MG/3ML SOPN INJECT 0.25 MG INTO THE SKIN ONCE WEEKLY    [provider]  Prenatal Vit-Fe Fumarate-FA (PRENATAL MULTIVITAMIN) TABS tablet Take 1 tablet by mouth daily at 12 noon.    [provider]  VITAMIN D PO Take by mouth.    [provider]    Family History Family History  Problem Relation Age of Onset   Diabetes Mother    Heart disease  Mother    Obesity Mother    Hypertension Father    Arthritis Father        gout   COPD Father        smoker   Hyperlipidemia Father    Cancer Father        stomach   High blood pressure Father    Sleep apnea Father    Hypertension Sister    Aneurysm Sister 50       cerebral;    COPD Paternal Aunt    Heart disease Maternal Grandmother    Heart disease Maternal Grandfather    Neuropathy Neg Hx    Asthma Neg Hx    Stroke Neg Hx     Social History Social History   Tobacco Use   Smoking status: Never   Smokeless tobacco: Never  Vaping Use   Vaping Use: Never used  Substance Use Topics   Alcohol use: Not Currently    Comment: occ   Drug use: No     Allergies   Metformin hcl er, Tape, Neomycin, Other, and Wound dressing adhesive   Review of Systems Review of Systems As per HPI  Physical Exam Triage Vital Signs ED Triage Vitals  Enc Vitals Group     BP 04/06/22 0910 125/84     Pulse Rate 04/06/22 0910 76     Resp 04/06/22 0910 16     Temp 04/06/22 0910 98.3 F (36.8 C)     Temp Source 04/06/22 0910 Oral     SpO2 04/06/22 0910 98 %     Weight --      Height --      Head Circumference --      Peak Flow --      Pain Score 04/06/22 0915 3     Pain Loc --      Pain Edu? --      Excl. in [place]? --    No data found.  Updated Vital Signs BP 125/84 (BP Location: Left Arm)   Pulse 76   Temp 98.3 F (36.8 C) (Oral)   Resp 16   LMP 03/09/2022 (Approximate)   SpO2 98%   Breastfeeding No   Visual Acuity Right Eye Distance:   Left Eye Distance:   Bilateral Distance:    Right Eye Near:   Left Eye Near:    Bilateral Near:     Physical Exam Vitals and nursing note reviewed.  Constitutional:  General: She is not in acute distress.    Appearance: Normal appearance. She is obese. She is not ill-appearing, toxic-appearing or diaphoretic.  HENT:     Head: Normocephalic and atraumatic.  Cardiovascular:     Rate and Rhythm: Normal rate.  Pulmonary:      Effort: Pulmonary effort is normal. No respiratory distress.  Musculoskeletal:        General: Tenderness (L great toe) present. No deformity. Normal range of motion.  Skin:    General: Skin is warm and dry.     Capillary Refill: Unable to test due to dark nail polish; pulses intact and normal    Findings: Erythema (L great toe erythema and discharge. Warmth. No ingrown nail. No felon) present.  Neurological:     General: No focal deficit present.     Mental Status: She is alert and oriented to person, place, and time.     Sensory: No sensory deficit.      UC Treatments / Results  Labs (all labs ordered are listed, but only abnormal results are displayed) Labs Reviewed - No data to display  EKG   Radiology No results found.  Procedures Procedures (including critical care time)  Medications Ordered in UC Medications - No data to display  Initial Impression / Assessment and Plan / UC Course  I have reviewed the triage vital signs and the nursing notes.  Pertinent labs & imaging results that were available during my care of the patient were reviewed by me and considered in my medical decision making (see chart for details).     Paronychia L great toe - soak in epsom salt water, may continue using bacitracin. PO cephalexin.  T2DM - follow up with PCP if glucose remains elevated. Consider discussion regarding low dose naltrexone for carb cravings.   Final Clinical Impressions(s) / UC Diagnoses   Final diagnoses:  Paronychia of great toe of left foot  Type 2 diabetes mellitus with hyperglycemia, without long-term current use of insulin (HCC)     Discharge Instructions      You have a paronychia, which is an infection of the skin around the nailbed.  Please soak your toe in warm Epsom salt baths 3 times daily.  Leave soaking until the water temperature becomes room temperature.  Please take the antibiotic 4 times daily until gone.  Regarding carb craving, please  discuss low does naltrexone with your PCP     ED Prescriptions     Medication Sig Dispense Auth. Provider   cephALEXin (KEFLEX) 500 MG capsule Take 1 capsule (500 mg total) by mouth 4 (four) times daily for 7 days. 28 capsule Nisha Dhami L, PA      PDMP not reviewed this encounter.   Chaney Malling, [place] 04/06/22 1010

## 2022-04-06 NOTE — Discharge Instructions (Signed)
You have a paronychia, which is an infection of the skin around the nailbed.  Please soak your toe in warm Epsom salt baths 3 times daily.  Leave soaking until the water temperature becomes room temperature.  Please take the antibiotic 4 times daily until gone.  Regarding carb craving, please discuss low does naltrexone with your PCP

## 2022-04-06 NOTE — ED Triage Notes (Signed)
Patient presents to UC for possible left big toe infection. States she does not have a nail on toe. Has noted drainage and swelling since Weds. Has been cleaning and applying bacitracin.

## 2022-05-30 ENCOUNTER — Ambulatory Visit
Admission: EM | Admit: 2022-05-30 | Discharge: 2022-05-30 | Disposition: A | Discharge status: Home / Self Care | Payer: BC Managed Care – PPO

## 2022-05-30 DIAGNOSIS — R519 Headache, unspecified: Secondary | ICD-10-CM | POA: Diagnosis not present

## 2022-05-30 NOTE — Discharge Instructions (Signed)
Take OTC Tylenol or Ibuprofen as needed for pain.

## 2022-05-30 NOTE — ED Provider Notes (Signed)
[facility]    CSN: [id number] Arrival date & time: 05/30/22  1627      History   Chief Complaint Chief Complaint  Patient presents with   Head Injury    HPI Kendra Nguyen is a 42 y.o. female.   HPI  42 year old female with a past medical history significant for morbid obesity, gastric bypass, PCOS, type 2 diabetes presents for evaluation of scalp pain.  3 weeks ago she was readjust herself on a stool that broke and caused her to fall back and struck her head on a bookshelf.  She did not have a loss of consciousness and she denies any nausea or vomiting, change in vision, or light sensitivity.  She also denies headaches.  She states that approximately 1 week ago and the area where a hematoma had formed at the time of injury she had a pain.  The pain returned today and is still present.  Past Medical History:  Diagnosis Date   Anemia    Anxiety    Depression    Diabetes (HCC)    Edema of both lower extremities    Hx of laparoscopic gastric banding    Hyperlipemia    Hypertension    was taken of HTN meds by PCP "years ago"   Morbid obesity with BMI of 45.0-49.9, adult (HCC)    Obesity    OCD (obsessive compulsive disorder) 04/2016   PCOS (polycystic ovarian syndrome)    Varicose veins    Vitamin D deficiency     Patient Active Problem List   Diagnosis Date Noted   Encounter for induction of labor 10/08/2021   Status post repeat low transverse cesarean section 10/08/2021   Type 2 diabetes mellitus with hyperglycemia, without long-term current use of insulin (HCC) 10/31/2020   Left leg swelling 05/24/2020   History of PCOS 05/24/2020   Prediabetes 05/24/2020   S/P gastric bypass 12/18/2014   Hereditary and idiopathic peripheral neuropathy 06/07/2014   Pure hypercholesterolemia 12/15/2013   Vitamin D deficiency 12/15/2013   Morbid obesity (HCC) 03/21/2011    Past Surgical History:  Procedure Laterality Date   CESAREAN SECTION N/A 01/18/2016    Procedure: CESAREAN SECTION;  Surgeon: Osborn Coho, MD;  Location: [facility];  Service: Obstetrics;  Laterality: N/A;   CESAREAN SECTION N/A 10/08/2021   Procedure: CESAREAN SECTION;  Surgeon: Osborn Coho, MD;  Location: [facility] LD [facility]S;  Service: Obstetrics;  Laterality: N/A;   COLONOSCOPY WITH PROPOFOL N/A 11/24/2014   Procedure: COLONOSCOPY WITH PROPOFOL;  Surgeon: Rachael Fee, MD;  Location: [facility];  Service: Endoscopy;  Laterality: N/A;   LAPAROSCOPIC GASTRIC BANDING  04/17/2011   LAPAROSCOPIC GASTRIC SLEEVE RESECTION     2016   WISDOM TOOTH EXTRACTION      OB History     Gravida  2   Para  2   Term  2   Preterm      AB      Living  2      SAB      IAB      Ectopic      Multiple  0   Live Births  2            Home Medications    Prior to Admission medications   Medication Sig Start Date End Date Taking? Authorizing Provider  ferrous sulfate 325 (65 FE) MG tablet Take 325 mg by mouth daily with breakfast.   Yes [provider]  NIFEdipine (PROCARDIA-XL/NIFEDICAL-XL) 30  MG 24 hr tablet Take 30 mg by mouth daily.   Yes [provider]  oxyCODONE (OXY IR/ROXICODONE) 5 MG immediate release tablet Take 1-2 tablets (5-10 mg total) by mouth every 4 (four) hours as needed for moderate pain. 10/11/21  Yes Hoover Browns, MD  OZEMPIC, 0.25 OR 0.5 MG/DOSE, 2 MG/3ML SOPN INJECT 0.25 MG INTO THE SKIN ONCE WEEKLY   Yes [provider]  Prenatal Vit-Fe Fumarate-FA (PRENATAL MULTIVITAMIN) TABS tablet Take 1 tablet by mouth daily at 12 noon.   Yes [provider]  sertraline (ZOLOFT) 100 MG tablet Take 100 mg by mouth daily. 05/15/22  Yes [provider]  VITAMIN D PO Take by mouth.   Yes [provider]    Family History Family History  Problem Relation Age of Onset   Diabetes Mother    Heart disease Mother    Obesity Mother    Hypertension Father    Arthritis Father        gout   COPD Father         smoker   Hyperlipidemia Father    Cancer Father        stomach   High blood pressure Father    Sleep apnea Father    Hypertension Sister    Aneurysm Sister 45       cerebral;    COPD Paternal Aunt    Heart disease Maternal Grandmother    Heart disease Maternal Grandfather    Neuropathy Neg Hx    Asthma Neg Hx    Stroke Neg Hx     Social History Social History   Tobacco Use   Smoking status: Never   Smokeless tobacco: Never  Vaping Use   Vaping Use: Never used  Substance Use Topics   Alcohol use: Not Currently    Comment: occ   Drug use: No     Allergies   Metformin hcl er, Tape, Neomycin, Other, and Wound dressing adhesive   Review of Systems Review of Systems  Neurological:        Scalp pain     Physical Exam Triage Vital Signs ED Triage Vitals  Enc Vitals Group     BP --      Pulse --      Resp 05/30/22 1646 16     Temp --      Temp Source 05/30/22 1646 Oral     SpO2 --      Weight 05/30/22 1646 (!) 363 lb 3.2 oz (164.7 kg)     Height 05/30/22 1646 5\' 6"  (1.676 m)     Head Circumference --      Peak Flow --      Pain Score 05/30/22 1650 7     Pain Loc --      Pain Edu? --      Excl. in [place]? --    No data found.  Updated Vital Signs BP (!) 134/91 (BP Location: Left Arm)   Pulse 85   Temp 98.5 F (36.9 C) (Oral)   Resp 16   Ht 5\' 6"  (1.676 m)   Wt (!) 363 lb 3.2 oz (164.7 kg)   SpO2 96%   BMI 58.62 kg/m   Visual Acuity Right Eye Distance:   Left Eye Distance:   Bilateral Distance:    Right Eye Near:   Left Eye Near:    Bilateral Near:     Physical Exam Vitals and nursing note reviewed.  Constitutional:  Appearance: Normal appearance. She is not ill-appearing.  HENT:     Head: Normocephalic and atraumatic.  Skin:    General: Skin is warm and dry.     Capillary Refill: Capillary refill takes less than 2 seconds.     Findings: No erythema or lesion.  Neurological:     General: No focal deficit present.     Mental  Status: She is alert and oriented to person, place, and time.      UC Treatments / Results  Labs (all labs ordered are listed, but only abnormal results are displayed) Labs Reviewed - No data to display  EKG   Radiology No results found.  Procedures Procedures (including critical care time)  Medications Ordered in UC Medications - No data to display  Initial Impression / Assessment and Plan / UC Course  I have reviewed the triage vital signs and the nursing notes.  Pertinent labs & imaging results that were available during my care of the patient were reviewed by me and considered in my medical decision making (see chart for details).   Patient is a very pleasant, nontoxic-appearing 42 year old female presenting for evaluation of pain on the top left side of her scalp that started earlier in the week and then returned today.  3 weeks ago she suffered a head injury when the stool broke and she struck her head on a bookshelf.  Hematoma had initially formed over the site but it is not present now.  On exam there is no abnormalities to the scalp and it is not tender to palpation.  There is no ecchymosis, erythema, or edema.  The etiology of the pain is unclear though I suspect that it is either superficial nerve regeneration or resorption of the hematoma that happened at the time of the injury.  She has not had any new injuries.  I have advised her to use over-the-counter Tylenol and/or ibuprofen as needed for pain.  She can also apply ice as needed.  Return precautions reviewed.   Final Clinical Impressions(s) / UC Diagnoses   Final diagnoses:  Pain of scalp     Discharge Instructions      Take OTC Tylenol or Ibuprofen as needed for pain.     ED Prescriptions   None    PDMP not reviewed this encounter.   Becky Augusta, NP 05/30/22 [phone]

## 2022-05-30 NOTE — ED Triage Notes (Signed)
Pt c/o head injury x3 wks. States she was sitting in a chair & got up to try to readjust herself & stool broke & she fell & hit the back of her head on a book shelf, no LOC. Denies any N/V, ,blurred vision or light sensitivity at this time. States she did have a knot in the back of her head which caused consistent pain.

## 2022-06-03 ENCOUNTER — Encounter: Payer: Self-pay | Admitting: Emergency Medicine

## 2022-06-03 ENCOUNTER — Ambulatory Visit: Payer: BC Managed Care – PPO | Admitting: Emergency Medicine

## 2022-06-03 VITALS — BP 128/74 | HR 85 | Temp 98.2°F | Ht 66.0 in | Wt 358.0 lb

## 2022-06-03 DIAGNOSIS — Z7985 Long-term (current) use of injectable non-insulin antidiabetic drugs: Secondary | ICD-10-CM | POA: Diagnosis not present

## 2022-06-03 DIAGNOSIS — E1165 Type 2 diabetes mellitus with hyperglycemia: Secondary | ICD-10-CM | POA: Diagnosis not present

## 2022-06-03 LAB — POCT GLYCOSYLATED HEMOGLOBIN (HGB A1C): Hemoglobin A1C: 5.9 % — AB (ref 4.0–5.6)

## 2022-06-03 NOTE — Progress Notes (Signed)
Kendra Nguyen 42 y.o.   Chief Complaint  Patient presents with   Pain    Patient states she fell a couple of weeks ago and hit her head. Patient went to ED on 5/9    Rash    Rash on left inner arm , itching     HISTORY OF PRESENT ILLNESS: This is a 42 y.o. female here for follow-up of chronic medical problems Sees endocrinologist on a regular basis.  Scheduled to see her again next month Seen at urgent care center on 05/30/2022 complaining of scalp pain.  Much better today. Doing well.  Had baby girl 8 months ago. Slight rash to left arm with occasional itching No other complaints or medical concerns today.   HPI   Prior to Admission medications   Medication Sig Start Date End Date Taking? Authorizing Provider  OZEMPIC, 0.25 OR 0.5 MG/DOSE, 2 MG/3ML SOPN INJECT 0.25 MG INTO THE SKIN ONCE WEEKLY   Yes [provider]  sertraline (ZOLOFT) 100 MG tablet Take 100 mg by mouth daily. 05/15/22  Yes [provider]  ferrous sulfate 325 (65 FE) MG tablet Take 325 mg by mouth daily with breakfast. Patient not taking: Reported on 06/03/2022    [provider]  NIFEdipine (PROCARDIA-XL/NIFEDICAL-XL) 30 MG 24 hr tablet Take 30 mg by mouth daily. Patient not taking: Reported on 06/03/2022    [provider]  Prenatal Vit-Fe Fumarate-FA (PRENATAL MULTIVITAMIN) TABS tablet Take 1 tablet by mouth daily at 12 noon. Patient not taking: Reported on 06/03/2022    [provider]  VITAMIN D PO Take by mouth. Patient not taking: Reported on 06/03/2022    [provider]    Allergies  Allergen Reactions   Metformin Hcl Er Diarrhea   Tape     rash   Neomycin Rash and Other (See Comments)   Other Itching and Rash   Wound Dressing Adhesive Rash    Patient Active Problem List   Diagnosis Date Noted   Type 2 diabetes mellitus with hyperglycemia, without long-term current use of insulin (HCC) 10/31/2020   History of PCOS 05/24/2020    Prediabetes 05/24/2020   Hereditary and idiopathic peripheral neuropathy 06/07/2014   Pure hypercholesterolemia 12/15/2013   Vitamin D deficiency 12/15/2013   Morbid obesity (HCC) 03/21/2011    Past Medical History:  Diagnosis Date   Anemia    Anxiety    Depression    Diabetes (HCC)    Edema of both lower extremities    Hx of laparoscopic gastric banding    Hyperlipemia    Hypertension    was taken of HTN meds by PCP "years ago"   Morbid obesity with BMI of 45.0-49.9, adult (HCC)    Obesity    OCD (obsessive compulsive disorder) 04/2016   PCOS (polycystic ovarian syndrome)    Varicose veins    Vitamin D deficiency     Past Surgical History:  Procedure Laterality Date   CESAREAN SECTION N/A 01/18/2016   Procedure: CESAREAN SECTION;  Surgeon: Osborn Coho, MD;  Location: [facility] BIRTHING SUITES;  Service: Obstetrics;  Laterality: N/A;   CESAREAN SECTION N/A 10/08/2021   Procedure: CESAREAN SECTION;  Surgeon: Osborn Coho, MD;  Location: [facility] LD ORS;  Service: Obstetrics;  Laterality: N/A;   COLONOSCOPY WITH PROPOFOL N/A 11/24/2014   Procedure: COLONOSCOPY WITH PROPOFOL;  Surgeon: Rachael Fee, MD;  Location: [facility] ENDOSCOPY;  Service: Endoscopy;  Laterality: N/A;   LAPAROSCOPIC GASTRIC BANDING  04/17/2011   LAPAROSCOPIC GASTRIC SLEEVE  RESECTION     2016   WISDOM TOOTH EXTRACTION      Social History   Socioeconomic History   Marital status: Married    Spouse name: Not on file   Number of children: Not on file   Years of education: Master's   Highest education level: Master's degree (e.g., MA, MS, MEng, MEd, MSW, MBA)  Occupational History   Occupation: Runner, 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   Caffeine use: Soda occass [name])   Engaged   Social Determinants of Health   Financial Resource Strain: Low Risk  (08/07/2020)   Overall Financial Resource Strain (CARDIA)    Difficulty of Paying Living Expenses: Not very hard  Food Insecurity: No Food Insecurity (08/07/2020)   Hunger Vital Sign    Worried About Running Out of Food in the Last Year: Never true    Ran Out of Food in the Last Year: Never true  Transportation Needs: No Transportation Needs (08/07/2020)   PRAPARE - Administrator, Civil Service (Medical): No    Lack of Transportation (Non-Medical): No  Physical Activity: Insufficiently Active (08/07/2020)   Exercise Vital Sign    Days of Exercise per Week: 2 days    Minutes of Exercise per Session: 30 min  Stress: Stress Concern Present (08/07/2020)   [employer] of Occupational Health - Occupational Stress Questionnaire    Feeling of Stress : To some extent  Social Connections: Moderately Integrated (08/07/2020)   Social Connection and Isolation Panel [NHANES]    Frequency of Communication with Friends and Family: Once a week    Frequency of Social Gatherings with Friends and Family: Once a week    Attends Religious Services: More than 4 times per year    Active Member of [employer] or Organizations: Yes    Attends Engineer, structural: More than 4 times per year    Marital Status: Married  Catering manager Violence: Not on file    Family History  Problem Relation Age of Onset   Diabetes Mother    Heart disease Mother    Obesity Mother    Hypertension Father    Arthritis Father        gout   COPD Father        smoker   Hyperlipidemia Father    Cancer Father        stomach   High blood pressure Father    Sleep apnea Father    Hypertension Sister    Aneurysm Sister 34       cerebral;    COPD Paternal Aunt    Heart disease Maternal Grandmother    Heart disease Maternal Grandfather    Neuropathy Neg Hx     Asthma Neg Hx    Stroke Neg Hx      Review of Systems  Constitutional: Negative.  Negative for chills and fever.  HENT: Negative.  Negative for congestion and sore throat.   Respiratory: Negative.  Negative for cough and shortness of breath.   Cardiovascular: Negative.  Negative for chest pain and palpitations.  Gastrointestinal:  Negative for abdominal  pain, diarrhea, nausea and vomiting.  Genitourinary: Negative.  Negative for dysuria and hematuria.  Skin:  Positive for itching and rash (Left antecubital area).  Neurological: Negative.  Negative for dizziness and headaches.  All other systems reviewed and are negative.   Vitals:   06/03/22 1047  BP: 128/74  Pulse: 85  Temp: 98.2 F (36.8 C)  SpO2: 98%    Physical Exam Vitals reviewed.  Constitutional:      Appearance: Normal appearance. She is obese.  HENT:     Head: Normocephalic.  Eyes:     Extraocular Movements: Extraocular movements intact.     Pupils: Pupils are equal, round, and reactive to light.  Cardiovascular:     Rate and Rhythm: Normal rate and regular rhythm.     Pulses: Normal pulses.     Heart sounds: Normal heart sounds.  Pulmonary:     Effort: Pulmonary effort is normal.     Breath sounds: Normal breath sounds.  Musculoskeletal:     Cervical back: No tenderness.  Lymphadenopathy:     Cervical: No cervical adenopathy.  Skin:    General: Skin is warm and dry.     Findings: Rash present.     Comments: Mild rash to left antecubital area.  Ecchymotic in nature.  Looks like mild traumatic injury  Neurological:     Mental Status: She is alert and oriented to person, place, and time.  Psychiatric:        Mood and Affect: Mood normal.        Behavior: Behavior normal.    Results for orders placed or performed in visit on 06/03/22 (from the past 24 hour(s))  POCT HgB A1C     Status: Abnormal   Collection Time: 06/03/22 11:15 AM  Result Value Ref Range   Hemoglobin A1C 5.9 (A) 4.0 - 5.6 %   HbA1c  POC (<> result, manual entry)     HbA1c, POC (prediabetic range)     HbA1c, POC (controlled diabetic range)        ASSESSMENT & PLAN: A total of 43 minutes was spent with the patient and counseling/coordination of care regarding preparing for this visit, review of most recent office visit notes, review of most recent urgent care visit notes, review of most recent blood work results including interpretation of today's hemoglobin A1c, review of all medications and changes made, cardiovascular risks associated with diabetes, education on nutrition, prognosis, documentation, and need for follow-up.  Problem List Items Addressed This Visit       Endocrine   Type 2 diabetes mellitus with hyperglycemia, without long-term current use of insulin (HCC) - Primary    Much improved diabetes with hemoglobin A1c at 5.9 Recommend to increase Ozempic to 0.5 mg weekly Tolerating side effects very well on the low-dose Scheduled to see her endocrinologist next month      Relevant Orders   POCT HgB A1C (Completed)     Other   Morbid obesity (HCC)    Eating better and losing weight Ozempic helping.  Time to increase dose to 0.5 mg weekly Diet and nutrition discussed Advised to decrease amount of daily carbohydrate intake and daily calories and increase amount of plant-based protein in her diet Benefits of exercise discussed.      Patient Instructions  Health Maintenance, Female Adopting a healthy lifestyle and getting preventive care are important in promoting health and wellness. Ask your health care provider about: The right schedule for you to have regular tests and exams. Things  you can do on your own to prevent diseases and keep yourself healthy. What should I know about diet, weight, and exercise? Eat a healthy diet  Eat a diet that includes plenty of vegetables, fruits, low-fat dairy products, and lean protein. Do not eat a lot of foods that are high in solid fats, added sugars, or  sodium. Maintain a healthy weight Body mass index (BMI) is used to identify weight problems. It estimates body fat based on height and weight. Your health care provider can help determine your BMI and help you achieve or maintain a healthy weight. Get regular exercise Get regular exercise. This is one of the most important things you can do for your health. Most adults should: Exercise for at least 150 minutes each week. The exercise should increase your heart rate and make you sweat (moderate-intensity exercise). Do strengthening exercises at least twice a week. This is in addition to the moderate-intensity exercise. Spend less time sitting. Even light physical activity can be beneficial. Watch cholesterol and blood lipids Have your blood tested for lipids and cholesterol at 42 years of age, then have this test every 5 years. Have your cholesterol levels checked more often if: Your lipid or cholesterol levels are high. You are older than 42 years of age. You are at high risk for heart disease. What should I know about cancer screening? Depending on your health history and family history, you may need to have cancer screening at various ages. This may include screening for: Breast cancer. Cervical cancer. Colorectal cancer. Skin cancer. Lung cancer. What should I know about heart disease, diabetes, and high blood pressure? Blood pressure and heart disease High blood pressure causes heart disease and increases the risk of stroke. This is more likely to develop in people who have high blood pressure readings or are overweight. Have your blood pressure checked: Every 3-5 years if you are 19-31 years of age. Every year if you are 13 years old or older. Diabetes Have regular diabetes screenings. This checks your fasting blood sugar level. Have the screening done: Once every three years after age 43 if you are at a normal weight and have a low risk for diabetes. More often and at a younger  age if you are overweight or have a high risk for diabetes. What should I know about preventing infection? Hepatitis B If you have a higher risk for hepatitis B, you should be screened for this virus. Talk with your health care provider to find out if you are at risk for hepatitis B infection. Hepatitis C Testing is recommended for: Everyone born from [birth year] through 1965. Anyone with known risk factors for hepatitis C. Sexually transmitted infections (STIs) Get screened for STIs, including gonorrhea and chlamydia, if: You are sexually active and are younger than 42 years of age. You are older than 42 years of age and your health care provider tells you that you are at risk for this type of infection. Your sexual activity has changed since you were last screened, and you are at increased risk for chlamydia or gonorrhea. Ask your health care provider if you are at risk. Ask your health care provider about whether you are at high risk for HIV. Your health care provider may recommend a prescription medicine to help prevent HIV infection. If you choose to take medicine to prevent HIV, you should first get tested for HIV. You should then be tested every 3 months for as long as you are taking the medicine. Pregnancy  If you are about to stop having your period (premenopausal) and you may become pregnant, seek counseling before you get pregnant. Take 400 to 800 micrograms (mcg) of folic acid every day if you become pregnant. Ask for birth control (contraception) if you want to prevent pregnancy. Osteoporosis and menopause Osteoporosis is a disease in which the bones lose minerals and strength with aging. This can result in bone fractures. If you are 62 years old or older, or if you are at risk for osteoporosis and fractures, ask your health care provider if you should: Be screened for bone loss. Take a calcium or vitamin D supplement to lower your risk of fractures. Be given hormone replacement therapy  (HRT) to treat symptoms of menopause. Follow these instructions at home: Alcohol use Do not drink alcohol if: Your health care provider tells you not to drink. You are pregnant, may be pregnant, or are planning to become pregnant. If you drink alcohol: Limit how much you have to: 0-1 drink a day. Know how much alcohol is in your drink. In the [place] one drink equals one 12 oz bottle of beer (355 mL), one 5 oz glass of wine (148 mL), or one 1 oz glass of hard liquor (44 mL). Lifestyle Do not use any products that contain nicotine or tobacco. These products include cigarettes, chewing tobacco, and vaping devices, such as e-cigarettes. If you need help quitting, ask your health care provider. Do not use street drugs. Do not share needles. Ask your health care provider for help if you need support or information about quitting drugs. General instructions Schedule regular health, dental, and eye exams. Stay current with your vaccines. Tell your health care provider if: You often feel depressed. You have ever been abused or do not feel safe at home. Summary Adopting a healthy lifestyle and getting preventive care are important in promoting health and wellness. Follow your health care provider's instructions about healthy diet, exercising, and getting tested or screened for diseases. Follow your health care provider's instructions on monitoring your cholesterol and blood pressure. This information is not intended to replace advice given to you by your health care provider. Make sure you discuss any questions you have with your health care provider. Document Revised: 05/29/2020 Document Reviewed: 05/29/2020 Elsevier Patient Education  2023 Elsevier Inc.     Edwina Barth, MD [facility]

## 2022-06-03 NOTE — Assessment & Plan Note (Signed)
Eating better and losing weight Ozempic helping.  Time to increase dose to 0.5 mg weekly Diet and nutrition discussed Advised to decrease amount of daily carbohydrate intake and daily calories and increase amount of plant-based protein in her diet Benefits of exercise discussed.

## 2022-06-03 NOTE — Assessment & Plan Note (Signed)
Much improved diabetes with hemoglobin A1c at 5.9 Recommend to increase Ozempic to 0.5 mg weekly Tolerating side effects very well on the low-dose Scheduled to see her endocrinologist next month

## 2022-06-03 NOTE — Patient Instructions (Signed)

## 2022-07-05 ENCOUNTER — Ambulatory Visit: Admit: 2022-07-05 | Disposition: A | Discharge status: Home / Self Care | Payer: BC Managed Care – PPO

## 2022-08-16 IMAGING — US US MFM OB DETAIL+14 WK
1 series · 14 of 28 positions shown · non-contrast
Comparison: none

[Series 1: us mfm ob detail+14 wk · 14 of 55 slices shown]
[im 3/55]
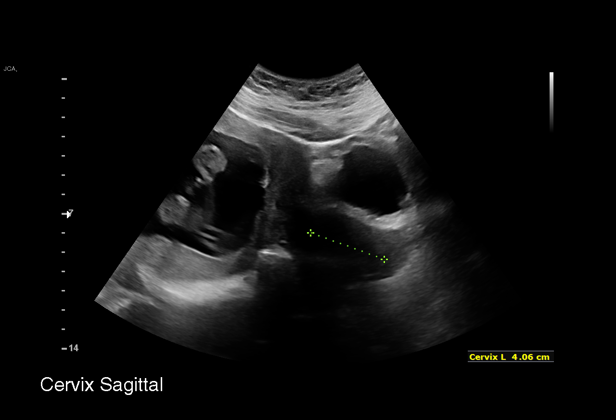
[im 7/55]
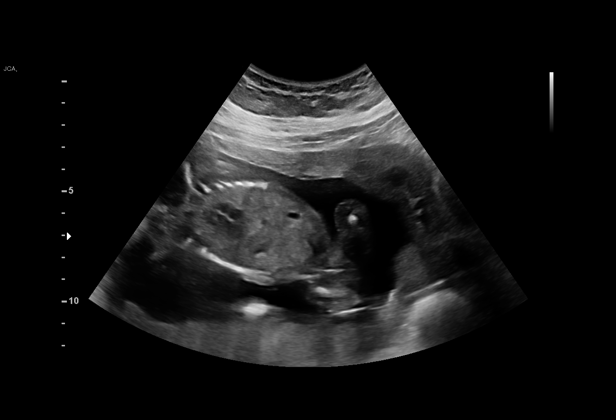
[im 11/55]
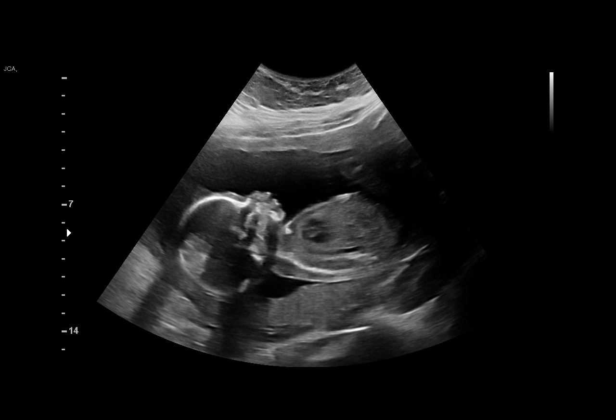
[im 15/55]
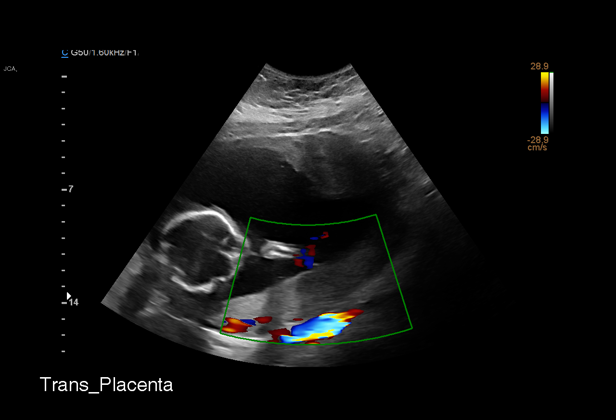
[im 19/55]
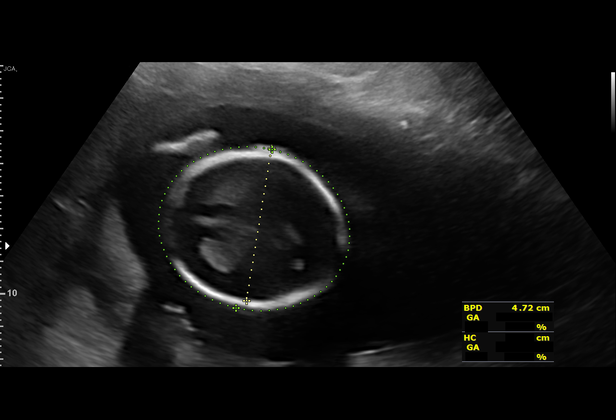
[im 23/55]
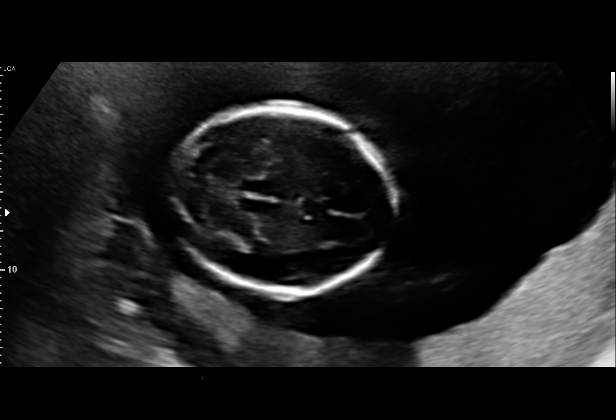
[im 27/55]
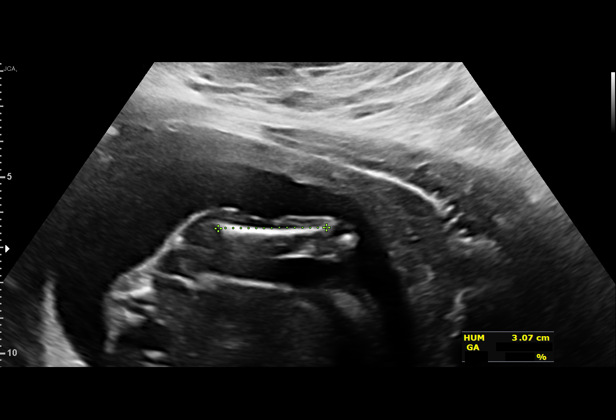
[im 31/55]
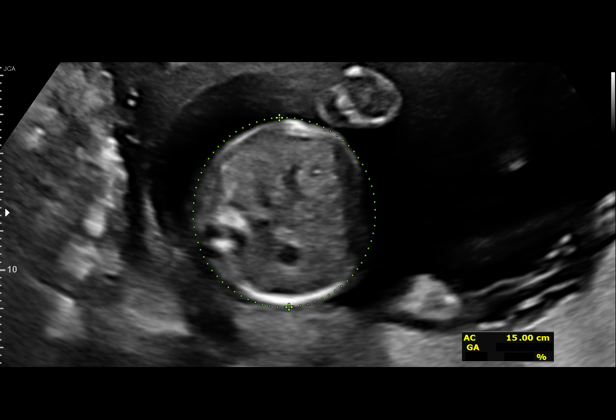
[im 35/55]
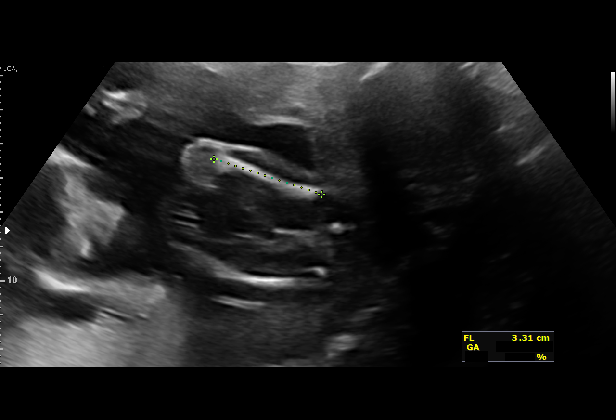
[im 39/55]
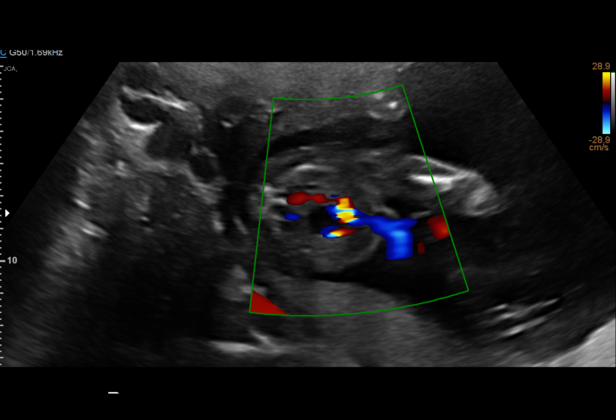
[im 43/55]
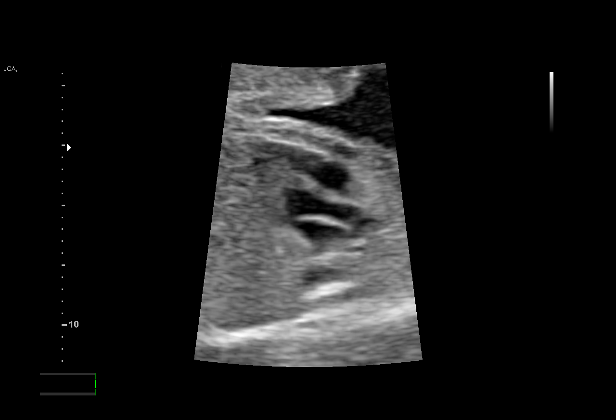
[im 47/55]
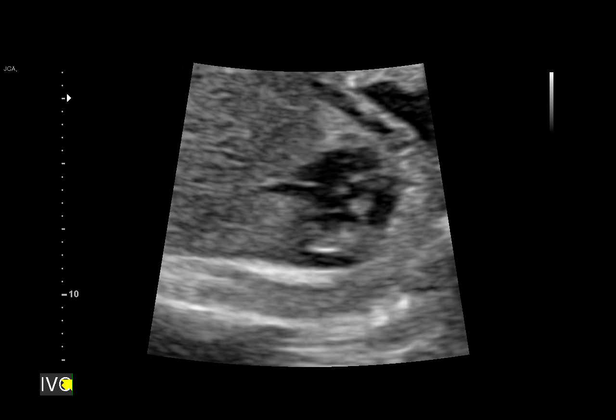
[im 51/55]
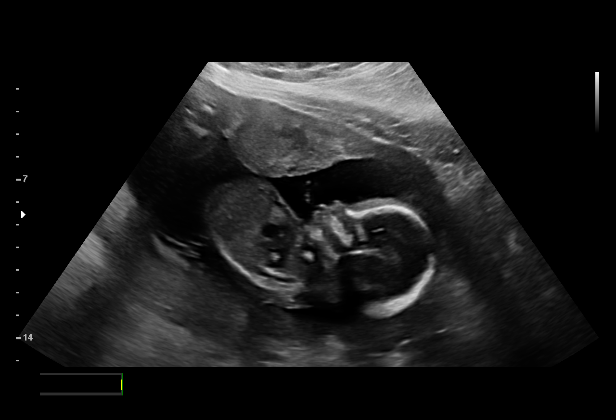
[im 55/55]
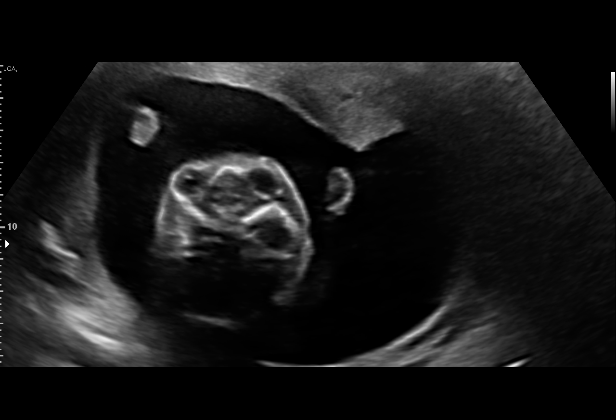

[14 of 28 positions shown; findings below may reference images not displayed]

OB

Indications

 Advanced maternal age multigravida 35+,
 second trimester
 Pre-existing diabetes, type 2, in pregnancy,
 second trimester (Insulin)
 Hypertension - Chronic/Pre-existing
 Obesity complicating pregnancy, second
 trimester
 Pregnancy complicated by previous gastric
 bypass, antepartum, second trimester
 History of cesarean delivery, currently
 pregnant
 Poor obstetric history: Previous
 preeclampsia / eclampsia/gestational HTN
 Pregnancy complicated by previous gastric
 bypass, antepartum, second trimester
 Encounter for antenatal screening for
 malformations
 19 weeks gestation of pregnancy
 Genetic carrier (Sickle cell trait & SMA
 Carrier)
 Low Risk NIPS
Fetal Evaluation

 Num Of Fetuses:         1
 Fetal Heart Rate(bpm):  158
 Cardiac Activity:       Observed
 Presentation:           Breech
 Placenta:               Posterior
 P. Cord Insertion:      Visualized

 Amniotic Fluid
 AFI FV:      Within normal limits

                             Largest Pocket(cm)

Biometry

 BPD:      46.9  mm     G. Age:  20w 1d         88  %    CI:        73.73   %    70 - 86
                                                         FL/HC:      19.1   %    16.1 -
 HC:      173.5  mm     G. Age:  19w 6d         77  %    HC/AC:      1.13        1.09 -
 AC:      153.1  mm     G. Age:  20w 4d         86  %    FL/BPD:     70.6   %
 FL:       33.1  mm     G. Age:  20w 2d         83  %    FL/AC:      21.6   %    20 - 24
 HUM:      31.5  mm     G. Age:  20w 4d         86  %
 CER:      20.8  mm     G. Age:  19w 6d         75  %
 NFT:       4.6  mm
 LV:        7.8  mm
 CM:          4  mm

 Est. FW:     350  gm    0 lb 12 oz      97  %
OB History

 Gravidity:    2         Term:   1        Prem:   0        SAB:   0
 TOP:          0       Ectopic:  0        Living: 1
Gestational Age

 LMP:           19w 6d        Date:  01/16/21                 EDD:   10/23/21
 U/S Today:     20w 2d                                        EDD:   10/20/21
 Best:          19w 1d     Det. By:  Early Ultrasound         EDD:   10/28/21
                                     (03/13/21)
Anatomy

 Cranium:               Appears normal         LVOT:                   Appears normal
 Cavum:                 Appears normal         Aortic Arch:            Not well visualized
 Ventricles:            Appears normal         Ductal Arch:            Not well visualized
 Choroid Plexus:        Appears normal         Diaphragm:              Appears normal
 Cerebellum:            Appears normal         Stomach:                Appears normal, left
                                                                       sided
 Posterior Fossa:       Appears normal         Abdomen:                Appears normal
 Nuchal Fold:           Appears normal         Abdominal Wall:         Appears nml (cord
                                                                       insert, abd wall)
 Face:                  Appears normal         Cord Vessels:           Appears normal (3
                        (orbits and profile)                           vessel cord)
 Lips:                  Not well visualized    Kidneys:                Appear normal
 Palate:                Not well visualized    Bladder:                Appears normal
 Thoracic:              Appears normal         Spine:                  Not well visualized
 Heart:                 Appears normal         Upper Extremities:      Visualized
                        (4CH, axis, and
                        situs)
 RVOT:                  Appears normal         Lower Extremities:      Visualized

 Other:  Technically difficult due to maternal habitus and fetal position.
Cervix Uterus Adnexa

 Cervix
 Length:           4.49  cm.
 Normal appearance by transabdominal scan.

 Uterus
 No abnormality visualized.

 Right Ovary
 Not visualized.

 Left Ovary
 Not visualized.

 Cul De Sac
 No free fluid seen.

 Adnexa
 No abnormality visualized.
Myomas

 Site                     L(cm)      W(cm)      D(cm)       Location
 LUS                      4.7        4.2        4           Intramural

 Blood Flow                  RI       PI       Comments

Impression
 We performed a fetal anatomical survey.  Amniotic fluid is
 normal and good fetal activity seen.  Fetal biometry is 8
 weeks ahead of her established gestational age.  No markers
 of aneuploidies or obvious fetal structural defects are seen.
 Placenta is posterior and there is no evidence of previa or
 placenta accreta spectrum.  An intramural myoma is seen
 (measurements above).
 As maternal obesity imposes limitations on the resolution of
 images, fetal anomalies may be missed.
 xxxxxxxxx[id number]
 Consultation (see [REDACTED] )

 I had the [facility] of [facility] She is G2 MPIIP at 19w 1d gestation
 and is here for fetal anatomical survey. She reports her EDD
 is 10/28/21 based on early ultrasound performed at your
 office.
 Her high-risk pregnancy problems include:
 -Advanced maternal age (41 years at delivery).  On cell-free
 fetal DNA screening, the risks of fetal aneuploidies are not
 increased.
 -Type 2 diabetes.  Diagnosed in this pregnancy.  Early
 screening showed a hemoglobin A1c level of 6.9% patient
 reports that has decreased to 5.9% now.  She takes Levemir
 35 units in the morning and 30 units at night and NovoLog 8
 to 14 units sliding scale with each meal.  She reports her
 blood glucose levels are better but still not yet well controlled.
 She does not have nephropathy.  She reports occasional
 tingling in the legs.
 -Chronic hypertension.  Patient takes nifedipine XL 30 mg
 daily and reports her blood pressures are well controlled.  Her
 blood pressure today at her office is 136/84 mmHg.
 -Class 3 obesity.  Patient had gastric sleeve bariatric surgery
 in [id number] and lost considerable weight.  Her prepregnancy
 weight was 381 pounds and her BMI was 61.5.
 -Previous cesarean section
 -Sickle cell trait.  Her partner is reportedly not a carrier of
 sickle cell disease.
 -Uterine leiomyoma.

 Past surgical history: Cesarean section, bariatric surgery, lap
 band surgery.
 Medications: Prenatal vitamins, insulin, nifedipine.
 Allergies: Neomycin (rashes).
 Social history: Denies tobacco or drug or alcohol use.  She is
 a special education teacher.  Her husband is African-
 American and he is in good health.
 Family history: Mother probably died of pulmonary embolism.

 Our concerns include
 Type 2 diabetes
 I discussed blood glucose parameters and the importance of
 good control of diabetes to prevent adverse fetal or neonatal
 outcomes.  Possible complications include fetal macrosomia,
 shoulder dystocia and neurological injuries at birth.  Neonatal
 complications include respiratory distress syndrome and
 hypoglycemia.  And poorly controlled diabetes stillbirth can
 occur.  The likelihood of congenital malformations with
 hemoglobin A1c level less than 7% is very low.
 Patient seems motivated and will be checking her blood
 glucose regularly.  I discussed the importance of exercise in
 addition to diet and insulin to control blood glucose levels.
 I discussed ultrasound protocol of monitoring fetal growth
 every 4 weeks and starting weekly BPP from 32 weeks
 gestation till delivery.  I discussed fetal echocardiography.
 Patient opted to have fetal echocardiography.

 Timing of delivery is based on control of diabetes and
 hypertension.  If diabetes and hypertension are both well
 controlled, I recommend delivery around 38 weeks gestation.
 I informed the patient that if diabetes and/or hypertension not
 well controlled, early term delivery (37 weeks gestation) will
 be recommended.

 Chronic hypertension
 -Adverse outcomes of severe chronic hypertension include
 maternal stroke, endorgan damage, coagulation
 disturbances. Placental abruption is more common.
 -Superimposed preeclampsia occurs in more than 30% of
 women with chronic hypertension
 I discussed the benefit of low-dose aspirin prophylaxis that
 helps delaying or preventing preeclampsia.
 -I discussed the safety profile of antihypertensives.
 Nifedipine can be safely given in pregnancy.  Alternative
 medications include labetalol and methyldopa.
 -I discussed ultrasound protocol of monitoring fetal growth
 assessment and antenatal testing.

 History of bariatric surgery.
 I discussed the benefit of bariatric surgery including reduction
 in the likelihood of gestational hypertension/preeclampsia.
 Bariatric surgery can be associated with slight increase in
 fetal growth restriction.  Nutritional deficiencies should be
 screened and corrected.  Complications of bariatric surgery
 including bowel obstructions can occur but are rare.

 Previous cesarean delivery
 I briefly discussed VBAC and its complication of scar rupture
 (1%).  Repeat cesarean deliveries increase the risk of
 placenta previa and placenta accreta spectrum.

 Myomas
 Myomas are likely to increase in pregnancies and can rarely
 cause severe pain.  Preterm delivery rates are slightly
 increased with myomas.
Recommendations

 -An appointment was made for her to return in 4 weeks for
 completion of fetal anatomy.
 -We have requested an appointment for fetal
 echocardiography.
 -Fetal growth assessments every 4 weeks till delivery.
 -Weekly BPP from 32 weeks gestation till delivery.
 -Delivery at 38 to 39 weeks gestation.
 -Low-dose aspirin prophylaxis.
 -Continue insulin and adjust dosage as necessary.
                Villano, Edercito

## 2022-09-16 ENCOUNTER — Encounter: Payer: Self-pay | Admitting: Emergency Medicine

## 2022-09-16 ENCOUNTER — Ambulatory Visit: Payer: BC Managed Care – PPO | Admitting: Emergency Medicine

## 2022-09-16 VITALS — BP 124/78 | HR 76 | Temp 98.1°F | Ht 66.0 in | Wt 356.0 lb

## 2022-09-16 DIAGNOSIS — E1165 Type 2 diabetes mellitus with hyperglycemia: Secondary | ICD-10-CM

## 2022-09-16 DIAGNOSIS — Z7985 Long-term (current) use of injectable non-insulin antidiabetic drugs: Secondary | ICD-10-CM

## 2022-09-16 DIAGNOSIS — R1084 Generalized abdominal pain: Secondary | ICD-10-CM | POA: Insufficient documentation

## 2022-09-16 DIAGNOSIS — Z23 Encounter for immunization: Secondary | ICD-10-CM | POA: Diagnosis not present

## 2022-09-16 LAB — CBC WITH DIFFERENTIAL/PLATELET
Basophils Absolute: 0 10*3/uL (ref 0.0–0.1)
Basophils Relative: 0.5 % (ref 0.0–3.0)
Eosinophils Absolute: 0.1 10*3/uL (ref 0.0–0.7)
Eosinophils Relative: 1.4 % (ref 0.0–5.0)
HCT: 40.9 % (ref 36.0–46.0)
Hemoglobin: 13.2 g/dL (ref 12.0–15.0)
Lymphocytes Relative: 27.2 % (ref 12.0–46.0)
Lymphs Abs: 2.3 10*3/uL (ref 0.7–4.0)
MCHC: 32.3 g/dL (ref 30.0–36.0)
MCV: 84.2 fl (ref 78.0–100.0)
Monocytes Absolute: 0.5 10*3/uL (ref 0.1–1.0)
Monocytes Relative: 5.5 % (ref 3.0–12.0)
Neutro Abs: 5.5 10*3/uL (ref 1.4–7.7)
Neutrophils Relative %: 65.4 % (ref 43.0–77.0)
Platelets: 303 10*3/uL (ref 150.0–400.0)
RBC: 4.86 Mil/uL (ref 3.87–5.11)
RDW: 14.4 % (ref 11.5–15.5)
WBC: 8.5 10*3/uL (ref 4.0–10.5)

## 2022-09-16 LAB — MICROALBUMIN / CREATININE URINE RATIO
Creatinine,U: 75.7 mg/dL
Microalb Creat Ratio: 0.9 mg/g (ref 0.0–30.0)
Microalb, Ur: <0.7 mg/dL (ref 0.0–1.9)

## 2022-09-16 LAB — COMPREHENSIVE METABOLIC PANEL WITH GFR
ALT: 17 U/L (ref 0–35)
AST: 20 U/L (ref 0–37)
Albumin: 3.9 g/dL (ref 3.5–5.2)
Alkaline Phosphatase: 81 U/L (ref 39–117)
BUN: 11 mg/dL (ref 6–23)
CO2: 26 meq/L (ref 19–32)
Calcium: 9.2 mg/dL (ref 8.4–10.5)
Chloride: 104 meq/L (ref 96–112)
Creatinine, Ser: 0.6 mg/dL (ref 0.40–1.20)
GFR: 111.09 mL/min
Glucose, Bld: 125 mg/dL — ABNORMAL HIGH (ref 70–99)
Potassium: 3.8 meq/L (ref 3.5–5.1)
Sodium: 138 meq/L (ref 135–145)
Total Bilirubin: 0.7 mg/dL (ref 0.2–1.2)
Total Protein: 6.9 g/dL (ref 6.0–8.3)

## 2022-09-16 LAB — URINALYSIS
Bilirubin Urine: NEGATIVE
Hgb urine dipstick: NEGATIVE
Ketones, ur: NEGATIVE
Leukocytes,Ua: NEGATIVE
Nitrite: NEGATIVE
Specific Gravity, Urine: 1.015 (ref 1.000–1.030)
Total Protein, Urine: NEGATIVE
Urine Glucose: NEGATIVE
Urobilinogen, UA: 0.2 (ref 0.0–1.0)
pH: 6 (ref 5.0–8.0)

## 2022-09-16 LAB — LIPASE: Lipase: 40 U/L (ref 11.0–59.0)

## 2022-09-16 LAB — HEMOGLOBIN A1C: Hgb A1c MFr Bld: 6 % (ref 4.6–6.5)

## 2022-09-16 MED ORDER — SEMAGLUTIDE (1 MG/DOSE) 4 MG/3ML ~~LOC~~ SOPN: 1.0000 mg | PEN_INJECTOR | SUBCUTANEOUS | 5 refills | Status: DC

## 2022-09-16 NOTE — Assessment & Plan Note (Signed)
Clinically stable.  No red flag signs or symptoms. Benign abdominal examination. No new medications. Differential diagnosis discussed Recommend blood work today including lipase and complete abdominal ultrasound. Had gastric sleeve surgery in 2016 Has been on Ozempic now for 9 to 10 months.  No problems with side effects earlier.

## 2022-09-16 NOTE — Progress Notes (Signed)
Kendra Nguyen 42 y.o.   Chief Complaint  Patient presents with   Abdominal Pain    Sharp, throbbing pain left side, lower abd pain near belly button, sharp, throbbing     HISTORY OF PRESENT ILLNESS: This is a 42 y.o. female complaining of generalized intermittent colicky abdominal pain for the past couple of weeks. Short lived episodes.  No nausea or vomiting. History of gastric sleeve. Diabetic on Ozempic which she started beginning of this year Occasional diarrhea triggered by food No other associated symptoms No other complaints or medical concerns today.  Abdominal Pain Associated symptoms include diarrhea. Pertinent negatives include no constipation, dysuria, fever, nausea or vomiting.     Prior to Admission medications   Medication Sig Start Date End Date Taking? Authorizing Provider  OZEMPIC, 0.25 OR 0.5 MG/DOSE, 2 MG/3ML SOPN INJECT 0.25 MG INTO THE SKIN ONCE WEEKLY   Yes [provider]  sertraline (ZOLOFT) 100 MG tablet Take 100 mg by mouth daily. 05/15/22  Yes [provider]  ferrous sulfate 325 (65 FE) MG tablet Take 325 mg by mouth daily with breakfast. Patient not taking: Reported on 06/03/2022    [provider]  NIFEdipine (PROCARDIA-XL/NIFEDICAL-XL) 30 MG 24 hr tablet Take 30 mg by mouth daily. Patient not taking: Reported on 06/03/2022    [provider]  Prenatal Vit-Fe Fumarate-FA (PRENATAL MULTIVITAMIN) TABS tablet Take 1 tablet by mouth daily at 12 noon. Patient not taking: Reported on 06/03/2022    [provider]  VITAMIN D PO Take by mouth. Patient not taking: Reported on 06/03/2022    [provider]    Allergies  Allergen Reactions   Metformin Hcl Er Diarrhea   Tape     rash   Neomycin Rash and Other (See Comments)   Other Itching and Rash   Wound Dressing Adhesive Rash    Patient Active Problem List   Diagnosis Date Noted   Type 2 diabetes mellitus with hyperglycemia, without long-term  current use of insulin (HCC) 10/31/2020   History of PCOS 05/24/2020   Prediabetes 05/24/2020   Hereditary and idiopathic peripheral neuropathy 06/07/2014   Pure hypercholesterolemia 12/15/2013   Vitamin D deficiency 12/15/2013   Morbid obesity (HCC) 03/21/2011    Past Medical History:  Diagnosis Date   Anemia    Anxiety    Depression    Diabetes (HCC)    Edema of both lower extremities    Hx of laparoscopic gastric banding    Hyperlipemia    Hypertension    was taken of HTN meds by PCP "years ago"   Morbid obesity with BMI of 45.0-49.9, adult (HCC)    Obesity    OCD (obsessive compulsive disorder) 04/2016   PCOS (polycystic ovarian syndrome)    Varicose veins    Vitamin D deficiency     Past Surgical History:  Procedure Laterality Date   CESAREAN SECTION N/A 01/18/2016   Procedure: CESAREAN SECTION;  Surgeon: Osborn Coho, MD;  Location: [facility]IR[facility]ING SU[facility];  Service: Obstetrics;  Laterality: N/A;   CESAREAN SECTION N/A 10/08/2021   Procedure: CESAREAN SECTION;  Surgeon: Osborn Coho, MD;  Location: [facility] LD ORS;  Service: Obstetrics;  Laterality: N/A;   COLONOSCOPY WITH PROPOFOL N/A 11/24/2014   Procedure: COLONOSCOPY WITH PROPOFOL;  Surgeon: Rachael Fee, MD;  Location: [facility] ENDOSCOPY;  Service: Endoscopy;  Laterality: N/A;   LAPAROSCOPIC GASTRIC BANDING  04/17/2011   LAPAROSCOPIC GASTRIC SLEEVE RESECTION     2016   WISDOM TOOTH EXTRACTION  Social History   Socioeconomic History   Marital status: Married    Spouse name: Not on file   Number of children: Not on file   Years of education: Master's   Highest education level: Master's degree (e.g., MA, MS, MEng, MEd, MSW, MBA)  Occupational History   Occupation: Runner, 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   Caffeine use: Soda occass [name])   Engaged   Social Determinants of Health   Financial Resource Strain: Low Risk  (08/07/2020)   Overall Financial Resource Strain (CARDIA)    Difficulty of Paying Living Expenses: Not very hard  Food Insecurity: No Food Insecurity (08/07/2020)   Hunger Vital Sign    Worried About Running Out of Food in the Last Year: Never true    Ran Out of Food in the Last Year: Never true  Transportation Needs: No Transportation Needs (08/07/2020)   PRAPARE - Administrator, Civil Service (Medical): No    Lack of Transportation (Non-Medical): No  Physical Activity: Insufficiently Active (08/07/2020)   Exercise Vital Sign    Days of Exercise per Week: 2 days    Minutes of Exercise per Session: 30 min  Stress: Stress Concern Present (08/07/2020)   [employer] of Occupational Health - Occupational Stress Questionnaire    Feeling of Stress : To some extent  Social Connections: Moderately Integrated (08/07/2020)   Social Connection and Isolation Panel [NHANES]    Frequency of Communication with Friends and Family: Once a week    Frequency of Social Gatherings with Friends and Family: Once a week    Attends Religious Services: More than 4 times per year    Active Member of [employer] or Organizations: Yes    Attends Engineer, structural: More than 4 times per year    Marital Status: Married  Catering manager Violence: Not on file    Family History  Problem Relation Age of Onset   Diabetes Mother    Heart disease Mother    Obesity Mother    Hypertension Father    Arthritis Father        gout   COPD Father        smoker   Hyperlipidemia Father    Cancer Father        stomach   High blood pressure Father    Sleep apnea Father    Hypertension Sister    Aneurysm Sister 34       cerebral;    COPD Paternal Aunt    Heart disease Maternal  Grandmother    Heart disease Maternal Grandfather    Neuropathy Neg Hx    Asthma Neg Hx    Stroke Neg Hx      Review of Systems  Constitutional: Negative.  Negative for chills and fever.  HENT: Negative.  Negative for congestion and sore throat.   Respiratory:  Negative for cough and shortness of breath.   Cardiovascular: Negative.  Negative for chest pain and palpitations.  Gastrointestinal:  Positive for abdominal pain and diarrhea. Negative for blood in stool, constipation, nausea and vomiting.  Genitourinary: Negative.  Negative  for dysuria.  Skin: Negative.  Negative for rash.  All other systems reviewed and are negative.   Vitals:   09/16/22 1113  BP: 124/78  Pulse: 76  Temp: 98.1 F (36.7 C)  SpO2: 98%    Physical Exam Vitals reviewed.  Constitutional:      Appearance: She is well-developed. She is obese.  HENT:     Head: Normocephalic.     Mouth/Throat:     Mouth: Mucous membranes are moist.     Pharynx: Oropharynx is clear.  Eyes:     Extraocular Movements: Extraocular movements intact.     Conjunctiva/sclera: Conjunctivae normal.     Pupils: Pupils are equal, round, and reactive to light.  Cardiovascular:     Rate and Rhythm: Normal rate and regular rhythm.     Pulses: Normal pulses.     Heart sounds: Normal heart sounds.  Pulmonary:     Effort: Pulmonary effort is normal.     Breath sounds: Normal breath sounds.  Abdominal:     Palpations: Abdomen is soft.     Tenderness: There is no abdominal tenderness.  Musculoskeletal:     Cervical back: No tenderness.  Lymphadenopathy:     Cervical: No cervical adenopathy.  Skin:    General: Skin is warm and dry.     Capillary Refill: Capillary refill takes less than 2 seconds.  Neurological:     General: No focal deficit present.     Mental Status: She is alert and oriented to person, place, and time.  Psychiatric:        Mood and Affect: Mood normal.        Behavior: Behavior normal.       ASSESSMENT & PLAN: A total of 46 minutes was spent with the patient and counseling/coordination of care regarding preparing for this visit, review of most recent office visit notes, review of most recent blood work results, review of chronic medical conditions under management, differential diagnosis of abdominal pain and need for workup including blood work and abdominal ultrasound, education on nutrition, review of all medications and changes made, prognosis, ED precautions, documentation and need for follow-up.  Problem List Items Addressed This Visit       Endocrine   Type 2 diabetes mellitus with hyperglycemia, without long-term current use of insulin (HCC)    Diet and nutrition discussed Has been tolerating Ozempic well Hemoglobin A1c done today Recommend to increase Ozempic to 1 mg weekly.      Relevant Medications   Semaglutide, 1 MG/DOSE, 4 MG/3ML SOPN   Other Relevant Orders   Comprehensive metabolic panel   Hemoglobin A1c   Urine Microalbumin w/creat. ratio     Other   Morbid obesity (HCC)    Diet and nutrition discussed Advised to decrease amount of daily carbohydrate intake and daily calories and increase amount of plant-based protein in her diet. Recommend to increase Ozempic to 1 mg weekly      Relevant Medications   Semaglutide, 1 MG/DOSE, 4 MG/3ML SOPN   Generalized abdominal pain - Primary    Clinically stable.  No red flag signs or symptoms. Benign abdominal examination. No new medications. Differential diagnosis discussed Recommend blood work today including lipase and complete abdominal ultrasound. Had gastric sleeve surgery in 2016 Has been on Ozempic now for 9 to 10 months.  No problems with side effects earlier.      Relevant Orders   US Abdomen Complete   Urinalysis   CBC with Differential/Platelet   Lipase  Comprehensive metabolic panel   Hemoglobin A1c   Urine Microalbumin w/creat. ratio   Other Visit Diagnoses     Need for  vaccination       Relevant Orders   Flu vaccine trivalent PF, 6mos and older(Flulaval,Afluria,Fluarix,Fluzone) (Completed)      Patient Instructions  Diabetes Mellitus and Nutrition, Adult When you have diabetes, or diabetes mellitus, it is very important to have healthy eating habits because your blood sugar (glucose) levels are greatly affected by what you eat and drink. Eating healthy foods in the right amounts, at about the same times every day, can help you: Manage your blood glucose. Lower your risk of heart disease. Improve your blood pressure. Reach or maintain a healthy weight. What can affect my meal plan? Every person with diabetes is different, and each person has different needs for a meal plan. Your health care provider may recommend that you work with a dietitian to make a meal plan that is best for you. Your meal plan may vary depending on factors such as: The calories you need. The medicines you take. Your weight. Your blood glucose, blood pressure, and cholesterol levels. Your activity level. Other health conditions you have, such as heart or kidney disease. How do carbohydrates affect me? Carbohydrates, also called carbs, affect your blood glucose level more than any other type of food. Eating carbs raises the amount of glucose in your blood. It is important to know how many carbs you can safely have in each meal. This is different for every person. Your dietitian can help you calculate how many carbs you should have at each meal and for each snack. How does alcohol affect me? Alcohol can cause a decrease in blood glucose (hypoglycemia), especially if you use insulin or take certain diabetes medicines by mouth. Hypoglycemia can be a life-threatening condition. Symptoms of hypoglycemia, such as sleepiness, dizziness, and confusion, are similar to symptoms of having too much alcohol. Do not drink alcohol if: Your health care provider tells you not to drink. You are  pregnant, may be pregnant, or are planning to become pregnant. If you drink alcohol: Limit how much you have to: 0-1 drink a day for women. 0-2 drinks a day for men. Know how much alcohol is in your drink. In the [place] one drink equals one 12 oz bottle of beer (355 mL), one 5 oz glass of wine (148 mL), or one 1 oz glass of hard liquor (44 mL). Keep yourself hydrated with water, diet soda, or unsweetened iced tea. Keep in mind that regular soda, juice, and other mixers may contain a lot of sugar and must be counted as carbs. What are tips for following this plan?  Reading food labels Start by checking the serving size on the Nutrition Facts label of packaged foods and drinks. The number of calories and the amount of carbs, fats, and other nutrients listed on the label are based on one serving of the item. Many items contain more than one serving per package. Check the total grams (g) of carbs in one serving. Check the number of grams of saturated fats and trans fats in one serving. Choose foods that have a low amount or none of these fats. Check the number of milligrams (mg) of salt (sodium) in one serving. Most people should limit total sodium intake to less than 2,300 mg per day. Always check the nutrition information of foods labeled as "low-fat" or "nonfat." These foods may be higher in added sugar or refined carbs  and should be avoided. Talk to your dietitian to identify your daily goals for nutrients listed on the label. Shopping Avoid buying canned, pre-made, or processed foods. These foods tend to be high in fat, sodium, and added sugar. Shop around the outside edge of the grocery store. This is where you will most often find fresh fruits and vegetables, bulk grains, fresh meats, and fresh dairy products. Cooking Use low-heat cooking methods, such as baking, instead of high-heat cooking methods, such as deep frying. Cook using healthy oils, such as olive, canola, or sunflower oil. Avoid  cooking with butter, cream, or high-fat meats. Meal planning Eat meals and snacks regularly, preferably at the same times every day. Avoid going long periods of time without eating. Eat foods that are high in fiber, such as fresh fruits, vegetables, beans, and whole grains. Eat 4-6 oz (112-168 g) of lean protein each day, such as lean meat, chicken, fish, eggs, or tofu. One ounce (oz) (28 g) of lean protein is equal to: 1 oz (28 g) of meat, chicken, or fish. 1 egg.  cup (62 g) of tofu. Eat some foods each day that contain healthy fats, such as avocado, nuts, seeds, and fish. What foods should I eat? Fruits Berries. Apples. Oranges. Peaches. Apricots. Plums. Grapes. Mangoes. Papayas. Pomegranates. Kiwi. Cherries. Vegetables Leafy greens, including lettuce, spinach, kale, chard, collard greens, mustard greens, and cabbage. Beets. Cauliflower. Broccoli. Carrots. Green beans. Tomatoes. Peppers. Onions. Cucumbers. Brussels sprouts. Grains Whole grains, such as whole-wheat or whole-grain bread, crackers, tortillas, cereal, and pasta. Unsweetened oatmeal. Quinoa. Brown or wild rice. Meats and other proteins Seafood. Poultry without skin. Lean cuts of poultry and beef. Tofu. Nuts. Seeds. Dairy Low-fat or fat-free dairy products such as milk, yogurt, and cheese. The items listed above may not be a complete list of foods and beverages you can eat and drink. Contact a dietitian for more information. What foods should I avoid? Fruits Fruits canned with syrup. Vegetables Canned vegetables. Frozen vegetables with butter or cream sauce. Grains Refined white flour and flour products such as bread, pasta, snack foods, and cereals. Avoid all processed foods. Meats and other proteins Fatty cuts of meat. Poultry with skin. Breaded or fried meats. Processed meat. Avoid saturated fats. Dairy Full-fat yogurt, cheese, or milk. Beverages Sweetened drinks, such as soda or iced tea. The items listed above  may not be a complete list of foods and beverages you should avoid. Contact a dietitian for more information. Questions to ask a health care provider Do I need to meet with a certified diabetes care and education specialist? Do I need to meet with a dietitian? What number can I call if I have questions? When are the best times to check my blood glucose? Where to find more information: American Diabetes Association: diabetes.org Academy of Nutrition and Dietetics: eatright.[employer] of Diabetes and Digestive and Kidney Diseases: StageSync.si Association of Diabetes Care & Education Specialists: diabeteseducator.org Summary It is important to have healthy eating habits because your blood sugar (glucose) levels are greatly affected by what you eat and drink. It is important to use alcohol carefully. A healthy meal plan will help you manage your blood glucose and lower your risk of heart disease. Your health care provider may recommend that you work with a dietitian to make a meal plan that is best for you. This information is not intended to replace advice given to you by your health care provider. Make sure you discuss any questions you have with your  health care provider. Document Revised: 08/11/2019 Document Reviewed: 08/11/2019 Elsevier Patient Education  2024 Elsevier Inc.     Edwina Barth, MD [facility]

## 2022-09-16 NOTE — Patient Instructions (Signed)

## 2022-09-16 NOTE — Assessment & Plan Note (Signed)
Diet and nutrition discussed Advised to decrease amount of daily carbohydrate intake and daily calories and increase amount of plant-based protein in her diet. Recommend to increase Ozempic to 1 mg weekly

## 2022-09-16 NOTE — Assessment & Plan Note (Signed)
Diet and nutrition discussed Has been tolerating Ozempic well Hemoglobin A1c done today Recommend to increase Ozempic to 1 mg weekly.

## 2022-09-18 ENCOUNTER — Ambulatory Visit
Admission: RE | Admit: 2022-09-18 | Discharge: 2022-09-18 | Disposition: A | Discharge status: Home / Self Care | Payer: BC Managed Care – PPO | Source: Ambulatory Visit | Attending: Emergency Medicine | Admitting: Emergency Medicine

## 2022-09-18 DIAGNOSIS — R1084 Generalized abdominal pain: Secondary | ICD-10-CM

## 2022-11-25 ENCOUNTER — Ambulatory Visit: Payer: BC Managed Care – PPO | Admitting: Emergency Medicine

## 2022-12-02 ENCOUNTER — Encounter: Payer: Self-pay | Admitting: Emergency Medicine

## 2022-12-02 ENCOUNTER — Ambulatory Visit: Payer: BC Managed Care – PPO | Admitting: Emergency Medicine

## 2022-12-02 VITALS — BP 124/78 | HR 70 | Temp 97.9°F | Ht 66.0 in | Wt 353.5 lb

## 2022-12-02 DIAGNOSIS — R1084 Generalized abdominal pain: Secondary | ICD-10-CM

## 2022-12-02 DIAGNOSIS — R1032 Left lower quadrant pain: Secondary | ICD-10-CM | POA: Diagnosis not present

## 2022-12-02 DIAGNOSIS — R109 Unspecified abdominal pain: Secondary | ICD-10-CM

## 2022-12-02 LAB — URINALYSIS
Bilirubin Urine: NEGATIVE
Hgb urine dipstick: NEGATIVE
Ketones, ur: NEGATIVE
Leukocytes,Ua: NEGATIVE
Nitrite: NEGATIVE
Specific Gravity, Urine: 1.015 (ref 1.000–1.030)
Total Protein, Urine: NEGATIVE
Urine Glucose: NEGATIVE
Urobilinogen, UA: 1 (ref 0.0–1.0)
pH: 6 (ref 5.0–8.0)

## 2022-12-02 LAB — COMPREHENSIVE METABOLIC PANEL
ALT: 14 U/L (ref 0–35)
AST: 17 U/L (ref 0–37)
Albumin: 3.9 g/dL (ref 3.5–5.2)
Alkaline Phosphatase: 73 U/L (ref 39–117)
BUN: 8 mg/dL (ref 6–23)
CO2: 26 meq/L (ref 19–32)
Calcium: 8.9 mg/dL (ref 8.4–10.5)
Chloride: 105 meq/L (ref 96–112)
Creatinine, Ser: 0.6 mg/dL (ref 0.40–1.20)
GFR: 110.93 mL/min (ref >60.00)
Glucose, Bld: 93 mg/dL (ref 70–99)
Potassium: 3.5 meq/L (ref 3.5–5.1)
Sodium: 138 meq/L (ref 135–145)
Total Bilirubin: 0.7 mg/dL (ref 0.2–1.2)
Total Protein: 6.8 g/dL (ref 6.0–8.3)

## 2022-12-02 LAB — LIPID PANEL
Cholesterol: 217 mg/dL — ABNORMAL HIGH (ref 0–200)
HDL: 53.5 mg/dL (ref >39.00)
LDL Cholesterol: 140 mg/dL — ABNORMAL HIGH (ref 0–99)
NonHDL: 163.07
Total CHOL/HDL Ratio: 4
Triglycerides: 117 mg/dL (ref 0.0–149.0)
VLDL: 23.4 mg/dL (ref 0.0–40.0)

## 2022-12-02 LAB — CBC WITH DIFFERENTIAL/PLATELET
Basophils Absolute: 0.1 10*3/uL (ref 0.0–0.1)
Basophils Relative: 0.8 % (ref 0.0–3.0)
Eosinophils Absolute: 0.2 10*3/uL (ref 0.0–0.7)
Eosinophils Relative: 2.3 % (ref 0.0–5.0)
HCT: 39.7 % (ref 36.0–46.0)
Hemoglobin: 13.3 g/dL (ref 12.0–15.0)
Lymphocytes Relative: 26.9 % (ref 12.0–46.0)
Lymphs Abs: 2.7 10*3/uL (ref 0.7–4.0)
MCHC: 33.4 g/dL (ref 30.0–36.0)
MCV: 85 fL (ref 78.0–100.0)
Monocytes Absolute: 0.6 10*3/uL (ref 0.1–1.0)
Monocytes Relative: 6.1 % (ref 3.0–12.0)
Neutro Abs: 6.4 10*3/uL (ref 1.4–7.7)
Neutrophils Relative %: 63.9 % (ref 43.0–77.0)
Platelets: 290 10*3/uL (ref 150.0–400.0)
RBC: 4.67 Mil/uL (ref 3.87–5.11)
RDW: 14 % (ref 11.5–15.5)
WBC: 10 10*3/uL (ref 4.0–10.5)

## 2022-12-02 LAB — LIPASE: Lipase: 15 U/L (ref 11.0–59.0)

## 2022-12-02 LAB — TSH: TSH: 1.66 u[IU]/mL (ref 0.35–5.50)

## 2022-12-02 LAB — HEMOGLOBIN A1C: Hgb A1c MFr Bld: 6 % (ref 4.6–6.5)

## 2022-12-02 NOTE — Patient Instructions (Signed)
 Abdominal Pain, Adult    Many things can cause belly (abdominal) pain. In most cases, belly pain is not a serious problem and can be watched and treated at home. But in some cases, it can be serious.  Your doctor will try to find the cause of your belly pain.  Follow these instructions at home:  Medicines  Take over-the-counter and prescription medicines only as told by your doctor.  Do not take medicines that help you poop (laxatives) unless told by your doctor.  General instructions  Watch your belly pain for any changes. Tell your doctor if the pain gets worse.  Drink enough fluid to keep your pee (urine) pale yellow.  Contact a doctor if:  Your belly pain changes or gets worse.  You have very bad cramping or bloating in your belly.  You vomit.  Your pain gets worse with meals, after eating, or with certain foods.  You have trouble pooping or have watery poop for more than 2-3 days.  You are not hungry, or you lose weight without trying.  You have signs of not getting enough fluid or water (dehydration). These may include:  Dark pee, very little pee, or no pee.  Cracked lips or dry mouth.  Feeling sleepy or weak.  You have pain when you pee or poop.  Your belly pain wakes you up at night.  You have blood in your pee.  You have a fever.  Get help right away if:  You cannot stop vomiting.  Your pain is only in one part of your belly, like on the right side.  You have bloody or black poop, or poop that looks like tar.  You have trouble breathing.  You have chest pain.  These symptoms may be an emergency. Get help right away. Call 911.  Do not wait to see if the symptoms will go away.  Do not drive yourself to the hospital.  This information is not intended to replace advice given to you by your health care provider. Make sure you discuss any questions you have with your health care provider.  Document Revised: 10/24/2021 Document Reviewed: 10/24/2021  Elsevier Patient Education  2024 [employer].

## 2022-12-02 NOTE — Progress Notes (Signed)
Kendra Nguyen 42 y.o.   Chief Complaint  Patient presents with   Abdominal Pain    Patient states she is still having stomach pain, left upper abd. She states it is getting progressively worse. She states she did have some blood in her stool last week.     HISTORY OF PRESENT ILLNESS: This is a 42 y.o. female complaining of left-sided abdominal pain on and off for the past 6 months Still able to eat and drink.  Denies nausea or vomiting. Able to move bowels.  Has noticed blood in the stools a couple times. Not taking over-the-counter medications.  Denies NSAID abuse. Denies any other associated symptoms. No other complaints or medical concerns today.   Abdominal Pain Pertinent negatives include no dysuria, fever, headaches, hematuria, nausea or vomiting.     Prior to Admission medications   Medication Sig Start Date End Date Taking? Authorizing Provider  Semaglutide, 1 MG/DOSE, 4 MG/3ML SOPN Inject 1 mg as directed once a week. 09/16/22  Yes Marybella Ethier, Eilleen Kempf, MD  sertraline (ZOLOFT) 100 MG tablet Take 100 mg by mouth daily. 05/15/22  Yes [provider]  VYVANSE 30 MG capsule Take 30 mg by mouth every morning. 10/31/22  Yes [provider]  ferrous sulfate 325 (65 FE) MG tablet Take 325 mg by mouth daily with breakfast. Patient not taking: Reported on 06/03/2022    [provider]  NIFEdipine (PROCARDIA-XL/NIFEDICAL-XL) 30 MG 24 hr tablet Take 30 mg by mouth daily. Patient not taking: Reported on 06/03/2022    [provider]  Prenatal Vit-Fe Fumarate-FA (PRENATAL MULTIVITAMIN) TABS tablet Take 1 tablet by mouth daily at 12 noon. Patient not taking: Reported on 06/03/2022    [provider]  VITAMIN D PO Take by mouth. Patient not taking: Reported on 06/03/2022    [provider]    Allergies  Allergen Reactions   Metformin Hcl Er Diarrhea   Tape     rash   Neomycin Rash and Other (See Comments)   Other Itching and  Rash   Wound Dressing Adhesive Rash    Patient Active Problem List   Diagnosis Date Noted   Generalized abdominal pain 09/16/2022   Type 2 diabetes mellitus with hyperglycemia, without long-term current use of insulin (HCC) 10/31/2020   History of PCOS 05/24/2020   Prediabetes 05/24/2020   Hereditary and idiopathic peripheral neuropathy 06/07/2014   Pure hypercholesterolemia 12/15/2013   Vitamin D deficiency 12/15/2013   Morbid obesity (HCC) 03/21/2011    Past Medical History:  Diagnosis Date   Anemia    Anxiety    Depression    Diabetes (HCC)    Edema of both lower extremities    Hx of laparoscopic gastric banding    Hyperlipemia    Hypertension    was taken of HTN meds by PCP "years ago"   Morbid obesity with BMI of 45.0-49.9, adult (HCC)    Obesity    OCD (obsessive compulsive disorder) 04/2016   PCOS (polycystic ovarian syndrome)    Varicose veins    Vitamin D deficiency     Past Surgical History:  Procedure Laterality Date   CESAREAN SECTION N/A 01/18/2016   Procedure: CESAREAN SECTION;  Surgeon: Osborn Coho, MD;  Location: [facility] BIRTHING SUITES;  Service: Obstetrics;  Laterality: N/A;   CESAREAN SECTION N/A 10/08/2021   Procedure: CESAREAN SECTION;  Surgeon: Osborn Coho, MD;  Location: [facility] LD ORS;  Service: Obstetrics;  Laterality: N/A;   COLONOSCOPY WITH PROPOFOL N/A 11/24/2014  Procedure: COLONOSCOPY WITH PROPOFOL;  Surgeon: Rachael Fee, MD;  Location: [facility] ENDOSCOPY;  Service: Endoscopy;  Laterality: N/A;   LAPAROSCOPIC GASTRIC BANDING  04/17/2011   LAPAROSCOPIC GASTRIC SLEEVE RESECTION     2016   WISDOM TOOTH EXTRACTION      Social History   Socioeconomic History   Marital status: Married    Spouse name: Not on file   Number of children: Not on file   Years of education: Master's   Highest education level: Master's degree (e.g., MA, MS, MEng, MEd, MSW, MBA)  Occupational History   Occupation: Runner, 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   Caffeine use: Soda occass [name])   Engaged   Social Determinants of Health   Financial Resource Strain: Low Risk  (08/07/2020)   Overall Financial Resource Strain (CARDIA)    Difficulty of Paying Living Expenses: Not very hard  Food Insecurity: No Food Insecurity (08/07/2020)   Hunger Vital Sign    Worried About Running Out of Food in the Last Year: Never true    Ran Out of Food in the Last Year: Never true  Transportation Needs: No Transportation Needs (08/07/2020)   PRAPARE - Administrator, Civil Service (Medical): No    Lack of Transportation (Non-Medical): No  Physical Activity: Insufficiently Active (08/07/2020)   Exercise Vital Sign    Days of Exercise per Week: 2 days    Minutes of Exercise per Session: 30 min  Stress: Stress Concern Present (08/07/2020)   [employer] of Occupational Health - Occupational Stress Questionnaire    Feeling of Stress : To some extent  Social Connections: Moderately Integrated (08/07/2020)   Social Connection and Isolation Panel [NHANES]    Frequency of Communication with Friends and Family: Once a week    Frequency of Social Gatherings with Friends and Family: Once a week    Attends Religious Services: More than 4 times per year    Active Member of [employer] or Organizations: Yes    Attends Engineer, structural: More than 4 times per year    Marital Status: Married  Catering manager Violence: Not on file    Family History  Problem Relation Age of Onset   Diabetes Mother    Heart disease Mother    Obesity Mother    Hypertension Father    Arthritis Father        gout   COPD Father        smoker   Hyperlipidemia Father     Cancer Father        stomach   High blood pressure Father    Sleep apnea Father    Hypertension Sister    Aneurysm Sister 34       cerebral;    COPD Paternal Aunt    Heart disease Maternal Grandmother    Heart disease Maternal Grandfather    Neuropathy Neg Hx    Asthma Neg Hx    Stroke Neg Hx      Review of Systems  Constitutional: Negative.  Negative for chills and fever.  HENT: Negative.  Negative for congestion  and sore throat.   Eyes: Negative.   Respiratory: Negative.  Negative for cough and shortness of breath.   Cardiovascular: Negative.  Negative for chest pain and palpitations.  Gastrointestinal:  Positive for abdominal pain and blood in stool. Negative for nausea and vomiting.  Genitourinary: Negative.  Negative for dysuria and hematuria.  Musculoskeletal: Negative.   Skin: Negative.  Negative for rash.  Neurological: Negative.  Negative for dizziness and headaches.  All other systems reviewed and are negative.   Vitals:   12/02/22 1450  BP: 124/78  Pulse: 70  Temp: 97.9 F (36.6 C)  SpO2: 97%    Physical Exam Vitals reviewed.  Constitutional:      Appearance: She is well-developed. She is obese.  HENT:     Head: Normocephalic.  Eyes:     Extraocular Movements: Extraocular movements intact.  Cardiovascular:     Rate and Rhythm: Normal rate.     Pulses: Normal pulses.  Pulmonary:     Effort: Pulmonary effort is normal.  Abdominal:     Palpations: Abdomen is soft.     Tenderness: There is no abdominal tenderness.  Skin:    General: Skin is warm and dry.     Capillary Refill: Capillary refill takes less than 2 seconds.  Neurological:     General: No focal deficit present.     Mental Status: She is alert and oriented to person, place, and time.  Psychiatric:        Mood and Affect: Mood normal.        Behavior: Behavior normal.      ASSESSMENT & PLAN: A total of 42 minutes was spent with the patient and counseling/coordination of care  regarding preparing for this visit, review of most recent office visit notes, review of most recent blood work results, differential diagnosis of generalized abdominal pain and need for workup including CT scan of abdomen and pelvis and GI evaluation for possible colonoscopy, education on nutrition, pain management, ED precautions, prognosis, documentation, and need for follow-up  Problem List Items Addressed This Visit       Other   Generalized abdominal pain - Primary    Clinically stable.  No red flag signs or symptoms. Stable vital signs.  Afebrile. Benign physical examination. Recommend workup including CT scan of abdomen and pelvis, GI evaluation for possible colonoscopy, blood work. ED precautions given Pain management discussed.  Advised to take Tylenol as needed. Diet and nutrition discussed. Follow-up after GI evaluation.      Other Visit Diagnoses     Left sided abdominal pain       Relevant Orders   Ambulatory referral to Gastroenterology   CT ABDOMEN PELVIS W WO CONTRAST   Urinalysis   CBC with Differential/Platelet   Comprehensive metabolic panel   Hemoglobin A1c   Lipase   Lipid panel   TSH   Left lower quadrant abdominal pain       Relevant Orders   CT ABDOMEN PELVIS W WO CONTRAST   Urinalysis   CBC with Differential/Platelet   Comprehensive metabolic panel   Hemoglobin A1c   Lipase   Lipid panel   TSH       Patient Instructions  Abdominal Pain, Adult  Many things can cause belly (abdominal) pain. In most cases, belly pain is not a serious problem and can be watched and treated at home. But in some cases, it can be serious. Your doctor will try to find the cause of your belly pain. Follow these  instructions at home: Medicines Take over-the-counter and prescription medicines only as told by your doctor. Do not take medicines that help you poop (laxatives) unless told by your doctor. General instructions Watch your belly pain for any changes. Tell  your doctor if the pain gets worse. Drink enough fluid to keep your pee (urine) pale yellow. Contact a doctor if: Your belly pain changes or gets worse. You have very bad cramping or bloating in your belly. You vomit. Your pain gets worse with meals, after eating, or with certain foods. You have trouble pooping or have watery poop for more than 2-3 days. You are not hungry, or you lose weight without trying. You have signs of not getting enough fluid or water (dehydration). These may include: Dark pee, very little pee, or no pee. Cracked lips or dry mouth. Feeling sleepy or weak. You have pain when you pee or poop. Your belly pain wakes you up at night. You have blood in your pee. You have a fever. Get help right away if: You cannot stop vomiting. Your pain is only in one part of your belly, like on the right side. You have bloody or black poop, or poop that looks like tar. You have trouble breathing. You have chest pain. These symptoms may be an emergency. Get help right away. Call 911. Do not wait to see if the symptoms will go away. Do not drive yourself to the hospital. This information is not intended to replace advice given to you by your health care provider. Make sure you discuss any questions you have with your health care provider. Document Revised: 10/24/2021 Document Reviewed: 10/24/2021 Elsevier Patient Education  2024 Elsevier Inc.      Edwina Barth, MD [facility]

## 2022-12-02 NOTE — Assessment & Plan Note (Addendum)
Clinically stable.  No red flag signs or symptoms. Stable vital signs.  Afebrile. Benign physical examination. Recommend workup including CT scan of abdomen and pelvis, GI evaluation for possible colonoscopy, blood work. ED precautions given Pain management discussed.  Advised to take Tylenol as needed. Diet and nutrition discussed. Follow-up after GI evaluation.

## 2022-12-03 ENCOUNTER — Telehealth: Payer: Self-pay | Admitting: Emergency Medicine

## 2022-12-03 ENCOUNTER — Encounter: Payer: Self-pay | Admitting: Gastroenterology

## 2022-12-03 NOTE — Telephone Encounter (Signed)
Without contrast. Thanks

## 2022-12-03 NOTE — Telephone Encounter (Signed)
Verlon Au called from [facility] Imaging abdomen pelvis CT order needs to written as with contrast or without contrast. Please advise.

## 2022-12-05 ENCOUNTER — Other Ambulatory Visit: Payer: Self-pay | Admitting: *Deleted

## 2022-12-05 DIAGNOSIS — R109 Unspecified abdominal pain: Secondary | ICD-10-CM

## 2022-12-05 DIAGNOSIS — R1084 Generalized abdominal pain: Secondary | ICD-10-CM

## 2022-12-05 DIAGNOSIS — R1032 Left lower quadrant pain: Secondary | ICD-10-CM

## 2022-12-05 NOTE — Telephone Encounter (Signed)
New order placed for Ct abd pelvis without contrast

## 2022-12-06 ENCOUNTER — Encounter: Payer: Self-pay | Admitting: Emergency Medicine

## 2022-12-13 ENCOUNTER — Inpatient Hospital Stay: Admission: RE | Admit: 2022-12-13 | Payer: BC Managed Care – PPO | Source: Ambulatory Visit

## 2022-12-26 ENCOUNTER — Ambulatory Visit
Admission: RE | Admit: 2022-12-26 | Discharge: 2022-12-26 | Disposition: A | Discharge status: Home / Self Care | Payer: BC Managed Care – PPO | Source: Ambulatory Visit | Attending: Emergency Medicine | Admitting: Emergency Medicine

## 2022-12-26 DIAGNOSIS — R109 Unspecified abdominal pain: Secondary | ICD-10-CM

## 2022-12-26 DIAGNOSIS — R1032 Left lower quadrant pain: Secondary | ICD-10-CM

## 2023-02-26 ENCOUNTER — Ambulatory Visit: Payer: 59 | Admitting: Gastroenterology

## 2023-02-26 ENCOUNTER — Ambulatory Visit: Payer: BC Managed Care – PPO | Admitting: Physician Assistant

## 2023-03-19 ENCOUNTER — Ambulatory Visit: Payer: BC Managed Care – PPO | Admitting: Emergency Medicine

## 2023-04-08 ENCOUNTER — Ambulatory Visit: Payer: 59 | Admitting: Gastroenterology

## 2023-05-15 ENCOUNTER — Other Ambulatory Visit: Payer: Self-pay | Admitting: Emergency Medicine

## 2023-05-15 DIAGNOSIS — E1165 Type 2 diabetes mellitus with hyperglycemia: Secondary | ICD-10-CM

## 2023-06-11 ENCOUNTER — Ambulatory Visit
Admission: RE | Admit: 2023-06-11 | Discharge: 2023-06-11 | Disposition: A | Discharge status: Home / Self Care | Source: Ambulatory Visit | Attending: Emergency Medicine

## 2023-06-11 VITALS — BP 137/84 | HR 103 | Temp 97.9°F | Resp 18

## 2023-06-11 DIAGNOSIS — J0100 Acute maxillary sinusitis, unspecified: Secondary | ICD-10-CM | POA: Diagnosis not present

## 2023-06-11 MED ORDER — AMOXICILLIN-POT CLAVULANATE 875-125 MG PO TABS: 1.0000 | ORAL_TABLET | Freq: Two times a day (BID) | ORAL | 0 refills | Status: DC

## 2023-06-11 NOTE — ED Triage Notes (Signed)
 Patient to Urgent Care with complaints of cough/ chest congestion/ bilateral ear pain/ sore throat/ sneezing/ runny nose. Denies any fevers.  Symptoms started last Wednesday.   Meds: [facility] brand Dayquil/ hot tea.

## 2023-06-11 NOTE — Discharge Instructions (Addendum)
 Take the Augmentin as directed.  Follow up with your primary care provider if your symptoms are not improving.

## 2023-06-11 NOTE — ED Provider Notes (Signed)
 Kendra Nguyen    CSN: [id number] Arrival date & time: 06/11/23  0907      History   Chief Complaint Chief Complaint  Patient presents with   Cough    I also have congestion, ear, pain, sore throat, and a cough - Entered by patient    HPI Kendra Nguyen is a 43 y.o. female.  Patient presents with 1 week history of ear pain, sore throat, runny nose, congestion, cough.  Her symptoms are worse x 4 days.  Treating with DayQuil.  No fever or shortness of breath.  The history is provided by the patient and medical records.    Past Medical History:  Diagnosis Date   Anemia    Anxiety    Depression    Diabetes (HCC)    Edema of both lower extremities    Hx of laparoscopic gastric banding    Hyperlipemia    Hypertension    was taken of HTN meds by PCP "years ago"   Morbid obesity with BMI of 45.0-49.9, adult (HCC)    Obesity    OCD (obsessive compulsive disorder) 04/2016   PCOS (polycystic ovarian syndrome)    Varicose veins    Vitamin D  deficiency     Patient Active Problem List   Diagnosis Date Noted   Generalized abdominal pain 09/16/2022   Type 2 diabetes mellitus with hyperglycemia, without long-term current use of insulin  (HCC) 10/31/2020   History of PCOS 05/24/2020   Prediabetes 05/24/2020   Hereditary and idiopathic peripheral neuropathy 06/07/2014   Pure hypercholesterolemia 12/15/2013   Vitamin D  deficiency 12/15/2013   Morbid obesity (HCC) 03/21/2011    Past Surgical History:  Procedure Laterality Date   CESAREAN SECTION N/A 01/18/2016   Procedure: CESAREAN SECTION;  Surgeon: Renea Carrion, MD;  Location: [facility] BIRTHING SUITES;  Service: Obstetrics;  Laterality: N/A;   CESAREAN SECTION N/A 10/08/2021   Procedure: CESAREAN SECTION;  Surgeon: Renea Carrion, MD;  Location: [facility] LD ORS;  Service: Obstetrics;  Laterality: N/A;   COLONOSCOPY WITH PROPOFOL  N/A 11/24/2014   Procedure: COLONOSCOPY WITH PROPOFOL ;  Surgeon: Janel Medford, MD;  Location: [facility]  ENDOSCOPY;  Service: Endoscopy;  Laterality: N/A;   LAPAROSCOPIC GASTRIC BANDING  04/17/2011   LAPAROSCOPIC GASTRIC SLEEVE RESECTION     2016   WISDOM TOOTH EXTRACTION      OB History     Gravida  2   Para  2   Term  2   Preterm      AB      Living  2      SAB      IAB      Ectopic      Multiple  0   Live Births  2            Home Medications    Prior to Admission medications   Medication Sig Start Date End Date Taking? Authorizing Provider  amoxicillin -clavulanate (AUGMENTIN) 875-125 MG tablet Take 1 tablet by mouth every 12 (twelve) hours. 06/11/23  Yes Wellington Half, NP  ferrous sulfate  325 (65 FE) MG tablet Take 325 mg by mouth daily with breakfast. Patient not taking: Reported on 06/03/2022    [provider]  NIFEdipine  (PROCARDIA -XL/NIFEDICAL-XL) 30 MG 24 hr tablet Take 30 mg by mouth daily. Patient not taking: Reported on 06/03/2022    [provider]  Prenatal Vit-Fe Fumarate-FA (PRENATAL MULTIVITAMIN) TABS tablet Take 1 tablet by mouth daily at 12 noon. Patient not taking: Reported on 06/03/2022  [provider]  Semaglutide , 1 MG/DOSE, (OZEMPIC , 1 MG/DOSE,) 4 MG/3ML SOPN INJECT 1 MG ONCE A WEEK AS DIRECTED 05/15/23   Elvira Hammersmith, MD  sertraline (ZOLOFT) 100 MG tablet Take 100 mg by mouth daily. Patient not taking: Reported on 06/11/2023 05/15/22   [provider]  VITAMIN D  PO Take by mouth. Patient not taking: Reported on 06/03/2022    [provider]  VYVANSE 30 MG capsule Take 30 mg by mouth every morning. Patient not taking: Reported on 06/11/2023 10/31/22   [provider]    Family History Family History  Problem Relation Age of Onset   Diabetes Mother    Heart disease Mother    Obesity Mother    Hypertension Father    Arthritis Father        gout   COPD Father        smoker   Hyperlipidemia Father    Cancer Father        stomach   High blood pressure Father    Sleep  apnea Father    Hypertension Sister    Aneurysm Sister 34       cerebral;    COPD Paternal Aunt    Heart disease Maternal Grandmother    Heart disease Maternal Grandfather    Neuropathy Neg Hx    Asthma Neg Hx    Stroke Neg Hx     Social History Social History   Tobacco Use   Smoking status: Never   Smokeless tobacco: Never  Vaping Use   Vaping status: Never Used  Substance Use Topics   Alcohol use: Not Currently    Comment: occ   Drug use: No     Allergies   Metformin hcl er, Tape, Neomycin, Other, and Wound dressing adhesive   Review of Systems Review of Systems  Constitutional:  Negative for chills and fever.  HENT:  Positive for congestion, ear pain, postnasal drip, rhinorrhea, sinus pressure and sore throat.   Respiratory:  Positive for cough. Negative for shortness of breath.      Physical Exam Triage Vital Signs ED Triage Vitals  Encounter Vitals Group     BP 06/11/23 0927 137/84     Systolic BP Percentile --      Diastolic BP Percentile --      Pulse Rate 06/11/23 0927 (!) 103     Resp 06/11/23 0927 18     Temp 06/11/23 0927 97.9 F (36.6 C)     Temp src --      SpO2 06/11/23 0927 96 %     Weight --      Height --      Head Circumference --      Peak Flow --      Pain Score 06/11/23 0928 2     Pain Loc --      Pain Education --      Exclude from Growth Chart --    No data found.  Updated Vital Signs BP 137/84   Pulse (!) 103   Temp 97.9 F (36.6 C)   Resp 18   LMP 06/07/2023   SpO2 96%   Visual Acuity Right Eye Distance:   Left Eye Distance:   Bilateral Distance:    Right Eye Near:   Left Eye Near:    Bilateral Near:     Physical Exam Constitutional:      General: She is not in acute distress. HENT:     Right Ear: Tympanic membrane  is erythematous.     Left Ear: Tympanic membrane is erythematous.     Nose: Congestion present.     Mouth/Throat:     Mouth: Mucous membranes are moist.     Pharynx: Oropharynx is clear.   Cardiovascular:     Rate and Rhythm: Normal rate and regular rhythm.     Heart sounds: Normal heart sounds.  Pulmonary:     Effort: Pulmonary effort is normal. No respiratory distress.     Breath sounds: Normal breath sounds.  Neurological:     Mental Status: She is alert.      UC Treatments / Results  Labs (all labs ordered are listed, but only abnormal results are displayed) Labs Reviewed - No data to display  EKG   Radiology No results found.  Procedures Procedures (including critical care time)  Medications Ordered in UC Medications - No data to display  Initial Impression / Assessment and Plan / UC Course  I have reviewed the triage vital signs and the nursing notes.  Pertinent labs & imaging results that were available during my care of the patient were reviewed by me and considered in my medical decision making (see chart for details).    Acute sinusitis.  Lungs are clear and O2 sat is 96% on room air.  Treating today with Augmentin.  Tylenol  as needed.  Plain Mucinex  as needed.  Instructed patient to follow-up with her PCP if she is not improving.  She agrees to plan of care.  Final Clinical Impressions(s) / UC Diagnoses   Final diagnoses:  Acute non-recurrent maxillary sinusitis     Discharge Instructions      Take the Augmentin as directed.  Follow-up with your primary care provider if your symptoms are not improving.    ED Prescriptions     Medication Sig Dispense Auth. Provider   amoxicillin -clavulanate (AUGMENTIN) 875-125 MG tablet Take 1 tablet by mouth every 12 (twelve) hours. 14 tablet Wellington Half, NP      PDMP not reviewed this encounter.   Wellington Half, NP 06/11/23 [phone]

## 2023-07-02 ENCOUNTER — Ambulatory Visit: Admitting: Emergency Medicine

## 2023-07-02 ENCOUNTER — Encounter: Payer: Self-pay | Admitting: Emergency Medicine

## 2023-07-02 VITALS — BP 126/92 | HR 88 | Temp 98.2°F | Ht 66.0 in | Wt 363.0 lb

## 2023-07-02 DIAGNOSIS — Z7985 Long-term (current) use of injectable non-insulin antidiabetic drugs: Secondary | ICD-10-CM

## 2023-07-02 DIAGNOSIS — E1165 Type 2 diabetes mellitus with hyperglycemia: Secondary | ICD-10-CM | POA: Diagnosis not present

## 2023-07-02 MED ORDER — TIRZEPATIDE 5 MG/0.5ML ~~LOC~~ SOAJ: 5.0000 mg | SUBCUTANEOUS | 3 refills | Status: DC

## 2023-07-02 NOTE — Progress Notes (Signed)
 Kendra Nguyen 43 y.o.   Chief Complaint  Patient presents with   Medication Reaction    Is having side effects, having loose bowels and stomach cramping. Wants to discuss switching medications, possibly Mounjaro    HISTORY OF PRESENT ILLNESS: This is a 43 y.o. female A1A with history of diabetes but having lots of GI side effects Wants to switch to Mounjaro instead. No other complaints or medical concerns today Wt Readings from Last 3 Encounters:  07/02/23 (!) 363 lb (164.7 kg)  12/02/22 (!) 353 lb 8 oz (160.3 kg)  09/16/22 (!) 356 lb (161.5 kg)   Lab Results  Component Value Date   HGBA1C 6.0 12/02/2022     HPI   Prior to Admission medications   Medication Sig Start Date End Date Taking? Authorizing Provider  Semaglutide , 1 MG/DOSE, (OZEMPIC , 1 MG/DOSE,) 4 MG/3ML SOPN INJECT 1 MG ONCE A WEEK AS DIRECTED 05/15/23  Yes Anupama Piehl, Isidro Margo, MD  amoxicillin -clavulanate (AUGMENTIN ) 875-125 MG tablet Take 1 tablet by mouth every 12 (twelve) hours. Patient not taking: Reported on 07/02/2023 06/11/23   Wellington Half, NP  ferrous sulfate  325 (65 FE) MG tablet Take 325 mg by mouth daily with breakfast. Patient not taking: Reported on 07/02/2023    [provider]  NIFEdipine  (PROCARDIA -XL/NIFEDICAL-XL) 30 MG 24 hr tablet Take 30 mg by mouth daily. Patient not taking: Reported on 07/02/2023    [provider]  Prenatal Vit-Fe Fumarate-FA (PRENATAL MULTIVITAMIN) TABS tablet Take 1 tablet by mouth daily at 12 noon. Patient not taking: Reported on 07/02/2023    [provider]  sertraline (ZOLOFT) 100 MG tablet Take 100 mg by mouth daily. Patient not taking: Reported on 07/02/2023 05/15/22   [provider]  VITAMIN D  PO Take by mouth. Patient not taking: Reported on 07/02/2023    [provider]  VYVANSE 30 MG capsule Take 30 mg by mouth every morning. Patient not taking: Reported on 07/02/2023 10/31/22   [provider]    Allergies   Allergen Reactions   Metformin Hcl Er Diarrhea   Tape     rash   Neomycin Rash and Other (See Comments)   Other Itching and Rash   Wound Dressing Adhesive Rash    Patient Active Problem List   Diagnosis Date Noted   Generalized abdominal pain 09/16/2022   Type 2 diabetes mellitus with hyperglycemia, without long-term current use of insulin  (HCC) 10/31/2020   History of PCOS 05/24/2020   Prediabetes 05/24/2020   Hereditary and idiopathic peripheral neuropathy 06/07/2014   Pure hypercholesterolemia 12/15/2013   Vitamin D  deficiency 12/15/2013   Morbid obesity (HCC) 03/21/2011    Past Medical History:  Diagnosis Date   Anemia    Anxiety    Depression    Diabetes (HCC)    Edema of both lower extremities    Hx of laparoscopic gastric banding    Hyperlipemia    Hypertension    was taken of HTN meds by PCP years ago   Morbid obesity with BMI of 45.0-49.9, adult (HCC)    Obesity    OCD (obsessive compulsive disorder) 04/2016   PCOS (polycystic ovarian syndrome)    Varicose veins    Vitamin D  deficiency     Past Surgical History:  Procedure Laterality Date   CESAREAN SECTION N/A 01/18/2016   Procedure: CESAREAN SECTION;  Surgeon: Renea Carrion, MD;  Location: [facility] BIRTHING SUITES;  Service: Obstetrics;  Laterality: N/A;   CESAREAN SECTION N/A 10/08/2021   Procedure:  CESAREAN SECTION;  Surgeon: Renea Carrion, MD;  Location: [facility] LD ORS;  Service: Obstetrics;  Laterality: N/A;   COLONOSCOPY WITH PROPOFOL  N/A 11/24/2014   Procedure: COLONOSCOPY WITH PROPOFOL ;  Surgeon: Janel Medford, MD;  Location: [facility] ENDOSCOPY;  Service: Endoscopy;  Laterality: N/A;   LAPAROSCOPIC GASTRIC BANDING  04/17/2011   LAPAROSCOPIC GASTRIC SLEEVE RESECTION     2016   WISDOM TOOTH EXTRACTION      Social History   Socioeconomic History   Marital status: Married    Spouse name: Not on file   Number of children: Not on file   Years of education: Master's   Highest education level: Master's  degree (e.g., MA, MS, MEng, MEd, MSW, MBA)  Occupational History   Occupation: Runner, 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   Caffeine use: Soda occass Cathryne Cobble)   Engaged   Social Drivers of Health   Financial Resource Strain: Low Risk  (08/07/2020)   Overall Financial Resource Strain (CARDIA)    Difficulty of Paying Living Expenses: Not very hard  Food Insecurity: No Food Insecurity (08/07/2020)   Hunger Vital Sign    Worried About Running Out of Food in the Last Year: Never true    Ran Out of Food in the Last Year: Never true  Transportation Needs: No Transportation Needs (08/07/2020)   PRAPARE - Administrator, Civil Service (Medical): No    Lack of Transportation (Non-Medical): No  Physical Activity: Insufficiently Active (08/07/2020)   Exercise Vital Sign    Days of Exercise per Week: 2 days    Minutes of Exercise per Session: 30 min  Stress: Stress Concern Present (08/07/2020)   [employer] of Occupational Health - Occupational Stress Questionnaire    Feeling of Stress : To some extent  Social Connections: Moderately Integrated (08/07/2020)   Social Connection and Isolation Panel [NHANES]    Frequency of Communication with Friends and Family: Once a week    Frequency of Social Gatherings with Friends and Family: Once a week    Attends Religious Services: More than 4 times per year    Active Member of [employer] or Organizations: Yes    Attends Engineer, structural: More than 4 times per year    Marital Status: Married  Catering manager Violence: Not on file    Family History  Problem Relation Age of Onset   Diabetes Mother    Heart disease Mother     Obesity Mother    Hypertension Father    Arthritis Father        gout   COPD Father        smoker   Hyperlipidemia Father    Cancer Father        stomach   High blood pressure Father    Sleep apnea Father    Hypertension Sister    Aneurysm Sister 34       cerebral;    COPD Paternal Aunt    Heart disease Maternal Grandmother    Heart disease Maternal Grandfather    Neuropathy Neg Hx    Asthma Neg Hx  Stroke Neg Hx      Review of Systems  Constitutional: Negative.  Negative for chills and fever.  HENT: Negative.  Negative for congestion and sore throat.   Respiratory: Negative.  Negative for cough and shortness of breath.   Cardiovascular: Negative.  Negative for chest pain and palpitations.  Gastrointestinal:  Positive for abdominal pain, diarrhea and nausea. Negative for vomiting.  Genitourinary: Negative.  Negative for dysuria and hematuria.  Skin: Negative.  Negative for rash.  Neurological: Negative.  Negative for dizziness and headaches.  All other systems reviewed and are negative.   There were no vitals filed for this visit.  Physical Exam Vitals reviewed.  Constitutional:      Appearance: Normal appearance. She is obese.  HENT:     Head: Normocephalic.  Eyes:     Extraocular Movements: Extraocular movements intact.  Cardiovascular:     Rate and Rhythm: Normal rate.  Pulmonary:     Effort: Pulmonary effort is normal.  Abdominal:     Palpations: Abdomen is soft.     Tenderness: There is no abdominal tenderness.  Skin:    General: Skin is warm and dry.     Capillary Refill: Capillary refill takes less than 2 seconds.  Neurological:     Mental Status: She is alert and oriented to person, place, and time.  Psychiatric:        Mood and Affect: Mood normal.        Behavior: Behavior normal.      ASSESSMENT & PLAN: A total of 43 minutes was spent with the patient and counseling/coordination of care regarding preparing for this visit, review of most  recent office visit notes, review of multiple chronic medical conditions and their management, review of all medications, review of most recent bloodwork results, review of health maintenance items, education on nutrition, prognosis, documentation, and need for follow up.   Problem List Items Addressed This Visit       Endocrine   Type 2 diabetes mellitus with hyperglycemia, without long-term current use of insulin  (HCC) - Primary   Diet and nutrition discussed Blood work done today including hemoglobin A1c Has developed Ozempic  side effects Wants to switch to Mounjaro Recommend to start Mounjaro 5 mg weekly and monitor side effects closely.  Advised to contact the office if not tolerable. Cardiovascular risks associated with uncontrolled diabetes and obesity discussed Follow-up in 3 months      Relevant Medications   tirzepatide (MOUNJARO) 5 MG/0.5ML Pen   Other Relevant Orders   CBC with Differential/Platelet   Comprehensive metabolic panel with GFR   Hemoglobin A1c   Lipid panel     Other   Morbid obesity (HCC)   Diet and nutrition discussed Advised to decrease amount of daily carbohydrate intake and daily calories and increase amount of plant-based protein in her diet. Recommend to stop Ozempic  and start Mounjaro 5 mg weekly      Relevant Medications   tirzepatide (MOUNJARO) 5 MG/0.5ML Pen   Patient Instructions  Diabetes Mellitus and Nutrition, Adult When you have diabetes, or diabetes mellitus, it is very important to have healthy eating habits because your blood sugar (glucose) levels are greatly affected by what you eat and drink. Eating healthy foods in the right amounts, at about the same times every day, can help you: Manage your blood glucose. Lower your risk of heart disease. Improve your blood pressure. Reach or maintain a healthy weight. What can affect my meal plan? Every person with diabetes is  different, and each person has different needs for a meal plan.  Your health care provider may recommend that you work with a dietitian to make a meal plan that is best for you. Your meal plan may vary depending on factors such as: The calories you need. The medicines you take. Your weight. Your blood glucose, blood pressure, and cholesterol levels. Your activity level. Other health conditions you have, such as heart or kidney disease. How do carbohydrates affect me? Carbohydrates, also called carbs, affect your blood glucose level more than any other type of food. Eating carbs raises the amount of glucose in your blood. It is important to know how many carbs you can safely have in each meal. This is different for every person. Your dietitian can help you calculate how many carbs you should have at each meal and for each snack. How does alcohol affect me? Alcohol can cause a decrease in blood glucose (hypoglycemia), especially if you use insulin  or take certain diabetes medicines by mouth. Hypoglycemia can be a life-threatening condition. Symptoms of hypoglycemia, such as sleepiness, dizziness, and confusion, are similar to symptoms of having too much alcohol. Do not drink alcohol if: Your health care provider tells you not to drink. You are pregnant, may be pregnant, or are planning to become pregnant. If you drink alcohol: Limit how much you have to: 0-1 drink a day for women. 0-2 drinks a day for men. Know how much alcohol is in your drink. In the [place] one drink equals one 12 oz bottle of beer (355 mL), one 5 oz glass of wine (148 mL), or one 1 oz glass of hard liquor (44 mL). Keep yourself hydrated with water, diet soda, or unsweetened iced tea. Keep in mind that regular soda, juice, and other mixers may contain a lot of sugar and must be counted as carbs. What are tips for following this plan?  Reading food labels Start by checking the serving size on the Nutrition Facts label of packaged foods and drinks. The number of calories and the amount of  carbs, fats, and other nutrients listed on the label are based on one serving of the item. Many items contain more than one serving per package. Check the total grams (g) of carbs in one serving. Check the number of grams of saturated fats and trans fats in one serving. Choose foods that have a low amount or none of these fats. Check the number of milligrams (mg) of salt (sodium) in one serving. Most people should limit total sodium intake to less than 2,300 mg per day. Always check the nutrition information of foods labeled as low-fat or nonfat. These foods may be higher in added sugar or refined carbs and should be avoided. Talk to your dietitian to identify your daily goals for nutrients listed on the label. Shopping Avoid buying canned, pre-made, or processed foods. These foods tend to be high in fat, sodium, and added sugar. Shop around the outside edge of the grocery store. This is where you will most often find fresh fruits and vegetables, bulk grains, fresh meats, and fresh dairy products. Cooking Use low-heat cooking methods, such as baking, instead of high-heat cooking methods, such as deep frying. Cook using healthy oils, such as olive, canola, or sunflower oil. Avoid cooking with butter, cream, or high-fat meats. Meal planning Eat meals and snacks regularly, preferably at the same times every day. Avoid going long periods of time without eating. Eat foods that are high in fiber, such as  fresh fruits, vegetables, beans, and whole grains. Eat 4-6 oz (112-168 g) of lean protein each day, such as lean meat, chicken, fish, eggs, or tofu. One ounce (oz) (28 g) of lean protein is equal to: 1 oz (28 g) of meat, chicken, or fish. 1 egg.  cup (62 g) of tofu. Eat some foods each day that contain healthy fats, such as avocado, nuts, seeds, and fish. What foods should I eat? Fruits Berries. Apples. Oranges. Peaches. Apricots. Plums. Grapes. Mangoes. Papayas. Pomegranates. Kiwi.  Cherries. Vegetables Leafy greens, including lettuce, spinach, kale, chard, collard greens, mustard greens, and cabbage. Beets. Cauliflower. Broccoli. Carrots. Green beans. Tomatoes. Peppers. Onions. Cucumbers. Brussels sprouts. Grains Whole grains, such as whole-wheat or whole-grain bread, crackers, tortillas, cereal, and pasta. Unsweetened oatmeal. Quinoa. Brown or wild rice. Meats and other proteins Seafood. Poultry without skin. Lean cuts of poultry and beef. Tofu. Nuts. Seeds. Dairy Low-fat or fat-free dairy products such as milk, yogurt, and cheese. The items listed above may not be a complete list of foods and beverages you can eat and drink. Contact a dietitian for more information. What foods should I avoid? Fruits Fruits canned with syrup. Vegetables Canned vegetables. Frozen vegetables with butter or cream sauce. Grains Refined white flour and flour products such as bread, pasta, snack foods, and cereals. Avoid all processed foods. Meats and other proteins Fatty cuts of meat. Poultry with skin. Breaded or fried meats. Processed meat. Avoid saturated fats. Dairy Full-fat yogurt, cheese, or milk. Beverages Sweetened drinks, such as soda or iced tea. The items listed above may not be a complete list of foods and beverages you should avoid. Contact a dietitian for more information. Questions to ask a health care provider Do I need to meet with a certified diabetes care and education specialist? Do I need to meet with a dietitian? What number can I call if I have questions? When are the best times to check my blood glucose? Where to find more information: American Diabetes Association: diabetes.org Academy of Nutrition and Dietetics: eatright.[employer] of Diabetes and Digestive and Kidney Diseases: StageSync.si Association of Diabetes Care & Education Specialists: diabeteseducator.org Summary It is important to have healthy eating habits because your blood sugar  (glucose) levels are greatly affected by what you eat and drink. It is important to use alcohol carefully. A healthy meal plan will help you manage your blood glucose and lower your risk of heart disease. Your health care provider may recommend that you work with a dietitian to make a meal plan that is best for you. This information is not intended to replace advice given to you by your health care provider. Make sure you discuss any questions you have with your health care provider. Document Revised: 08/10/2019 Document Reviewed: 08/11/2019 Elsevier Patient Education  2024 Elsevier Inc.    Maryagnes Small, MD [facility]

## 2023-07-02 NOTE — Assessment & Plan Note (Signed)
 Diet and nutrition discussed Advised to decrease amount of daily carbohydrate intake and daily calories and increase amount of plant-based protein in her diet. Recommend to stop Ozempic  and start Mounjaro 5 mg weekly

## 2023-07-02 NOTE — Patient Instructions (Signed)

## 2023-07-02 NOTE — Assessment & Plan Note (Signed)
 Diet and nutrition discussed Blood work done today including hemoglobin A1c Has developed Ozempic  side effects Wants to switch to Mounjaro Recommend to start Mounjaro 5 mg weekly and monitor side effects closely.  Advised to contact the office if not tolerable. Cardiovascular risks associated with uncontrolled diabetes and obesity discussed Follow-up in 3 months

## 2023-08-14 ENCOUNTER — Encounter: Payer: Self-pay | Admitting: Emergency Medicine

## 2023-09-16 ENCOUNTER — Other Ambulatory Visit: Payer: Self-pay | Admitting: Radiology

## 2023-09-16 DIAGNOSIS — E1165 Type 2 diabetes mellitus with hyperglycemia: Secondary | ICD-10-CM

## 2023-09-16 MED ORDER — TIRZEPATIDE 7.5 MG/0.5ML ~~LOC~~ SOAJ: 7.5000 mg | SUBCUTANEOUS | 1 refills | Status: DC

## 2023-10-08 ENCOUNTER — Ambulatory Visit: Payer: Self-pay | Admitting: Emergency Medicine

## 2023-10-08 ENCOUNTER — Ambulatory Visit: Admitting: Emergency Medicine

## 2023-10-08 VITALS — BP 128/84 | HR 99 | Temp 97.9°F | Ht 66.0 in | Wt 361.0 lb

## 2023-10-08 DIAGNOSIS — G609 Hereditary and idiopathic neuropathy, unspecified: Secondary | ICD-10-CM

## 2023-10-08 DIAGNOSIS — E1165 Type 2 diabetes mellitus with hyperglycemia: Secondary | ICD-10-CM | POA: Diagnosis not present

## 2023-10-08 LAB — CBC WITH DIFFERENTIAL/PLATELET
Basophils Absolute: 0 K/uL (ref 0.0–0.1)
Basophils Relative: 0.6 % (ref 0.0–3.0)
Eosinophils Absolute: 0.1 K/uL (ref 0.0–0.7)
Eosinophils Relative: 1.5 % (ref 0.0–5.0)
HCT: 39.7 % (ref 36.0–46.0)
Hemoglobin: 13.4 g/dL (ref 12.0–15.0)
Lymphocytes Relative: 23.9 % (ref 12.0–46.0)
Lymphs Abs: 2 K/uL (ref 0.7–4.0)
MCHC: 33.8 g/dL (ref 30.0–36.0)
MCV: 83.4 fl (ref 78.0–100.0)
Monocytes Absolute: 0.4 K/uL (ref 0.1–1.0)
Monocytes Relative: 5.4 % (ref 3.0–12.0)
Neutro Abs: 5.6 K/uL (ref 1.4–7.7)
Neutrophils Relative %: 68.6 % (ref 43.0–77.0)
Platelets: 278 K/uL (ref 150.0–400.0)
RBC: 4.76 Mil/uL (ref 3.87–5.11)
RDW: 13.9 % (ref 11.5–15.5)
WBC: 8.2 K/uL (ref 4.0–10.5)

## 2023-10-08 LAB — COMPREHENSIVE METABOLIC PANEL WITH GFR
ALT: 14 U/L (ref 0–35)
AST: 15 U/L (ref 0–37)
Albumin: 4 g/dL (ref 3.5–5.2)
Alkaline Phosphatase: 59 U/L (ref 39–117)
BUN: 7 mg/dL (ref 6–23)
CO2: 26 meq/L (ref 19–32)
Calcium: 9.5 mg/dL (ref 8.4–10.5)
Chloride: 106 meq/L (ref 96–112)
Creatinine, Ser: 0.6 mg/dL (ref 0.40–1.20)
GFR: 110.27 mL/min (ref >60.00)
Glucose, Bld: 127 mg/dL — ABNORMAL HIGH (ref 70–99)
Potassium: 4 meq/L (ref 3.5–5.1)
Sodium: 137 meq/L (ref 135–145)
Total Bilirubin: 0.7 mg/dL (ref 0.2–1.2)
Total Protein: 7.2 g/dL (ref 6.0–8.3)

## 2023-10-08 LAB — MICROALBUMIN / CREATININE URINE RATIO
Creatinine,U: 78.9 mg/dL
Microalb Creat Ratio: UNDETERMINED mg/g (ref 0.0–30.0)
Microalb, Ur: <0.7 mg/dL

## 2023-10-08 LAB — LIPID PANEL
Cholesterol: 213 mg/dL — ABNORMAL HIGH (ref 0–200)
HDL: 50.9 mg/dL
LDL Cholesterol: 140 mg/dL — ABNORMAL HIGH (ref 0–99)
NonHDL: 161.69
Total CHOL/HDL Ratio: 4
Triglycerides: 109 mg/dL (ref 0.0–149.0)
VLDL: 21.8 mg/dL (ref 0.0–40.0)

## 2023-10-08 LAB — HEMOGLOBIN A1C: Hgb A1c MFr Bld: 6.6 % — ABNORMAL HIGH (ref 4.6–6.5)

## 2023-10-08 MED ORDER — DULOXETINE HCL 30 MG PO CPEP: 30.0000 mg | ORAL_CAPSULE | Freq: Every day | ORAL | 3 refills | Status: AC

## 2023-10-08 MED ORDER — TIRZEPATIDE 10 MG/0.5ML ~~LOC~~ SOAJ: 10.0000 mg | SUBCUTANEOUS | 5 refills | Status: AC

## 2023-10-08 NOTE — Patient Instructions (Addendum)
 Nerve Damage (Peripheral Neuropathy): What to Know Peripheral neuropathy happens when there's damage to the peripheral nerves. These nerves send signals between your spinal cord and your arms and legs. You can have damage in one or more nerves. What are the causes? There are many reasons why nerves can get damaged. Damage may be caused by some diseases, such as: Diabetes. This is the most common cause. Autoimmune diseases, such as rheumatoid arthritis or lupus. These happen when your body's defense system (immune system) attacks healthy parts of your body by mistake. Inherited nerve disease. This is passed down in families. Kidney disease. Thyroid disease. Other causes include: Pressure or stress on a nerve that lasts a long time. Lack of B vitamins from poor diet or drinking too much alcohol. Infections. Some medicines, like chemotherapy used for cancer treatment. Poisonous (toxic) substances, such as lead or mercury. Too little blood flowing to the legs. Sometimes, the cause isn't known. What are the signs or symptoms? Symptoms depend on which nerves are damaged. In your legs, hands, or arms, you may have: Loss of feeling (numbness). Tingling. Burning pain. Very sensitive skin. Weakness. Paralysis. This is when you can't move a part of your body. Clumsiness. Muscle twitching. Loss of balance. You may have trouble walking. Other symptoms can include: Not being able to control when you pee. Feeling dizzy. Problems during sex. How is this diagnosed? Your health care provider will: Ask about your symptoms and medical history. Do a physical exam. Do a neurological exam. They will check your reflexes, how you move, and what you can feel. You may need to have other tests, such as: Blood tests. Nerve tests to check how well your nerves and muscles work. Imaging tests, such as a CT scan or MRI. These are done to rule out other causes. Nerve biopsy. This is when a small piece of a  nerve is removed for testing. Lumbar puncture. A small amount of the fluid that surrounds the brain and spinal cord is taken and checked in a lab. How is this treated? Treatment may include: Treating the cause of the nerve damage. Pain medicines. Other medicines that may help with pain, such as: Medicines that treat seizures. Medicines that treat depression. Pain patches or creams that are put on painful areas of skin. TENS therapy. This uses a device that sends mild electrical pulses to your nerves. This will prevent pain signals from reaching the brain. Massage. Acupuncture. Surgery to relieve pressure on a nerve or to destroy a nerve that's causing pain. This is done only if the other treatments are not helping. You may also need physical therapy to help improve your movement, balance, and muscle strength. You may be told to use a cane or walker if needed. Follow these instructions at home: Medicines Take your medicines only as told. You may need to take steps to help treat or prevent trouble pooping (constipation), such as: Taking medicines to help you poop. Eating foods high in fiber, like beans, whole grains, and fresh fruits and vegetables. Drinking more fluids as told. Ask your provider if it's safe to drive or use machines while taking your medicine. Lifestyle  Do not smoke, vape, or use nicotine or tobacco. Avoid or limit alcohol. Too much alcohol can be harmful to nerves and cause damage. Eat a healthy diet. Safety  Take care to avoid burning or damaging your skin if you have numbness. If you have numbness in your feet: Check your feet each day for signs of injury  or infection. Watch for redness, warmth, and swelling. Wear padded socks and comfortable shoes. These help protect your feet. General instructions If you have diabetes, keep your blood sugar under control. Develop a good support system. Living with nerve damage can cause a lot of stress. Talk with a mental  health therapist or join a support group. Exercise as told. Ask what things are safe for you to do at home. Work with a pain specialist to find the best way to manage your pain. Keep all follow-up visits. Your provider will check if the treatments are working and change them if needed. Where to find support The Foundation for Peripheral Neuropathy: BudgetManiac.si Where to find more information To learn more, go to: [employer] of Neurological Disorders and Stroke at BasicFM.no. Click Search and type peripheral neuropathy. Find the link you need. Contact a health care provider if: Your pain treatments are not working. Your symptoms are getting worse. You have side effects from any of your medicines. You have new symptoms. You're having a hard time coping with your condition. Get help right away if: You have a wound that's not healing. You have new weakness in an arm or leg. You've fallen or you fall often. This information is not intended to replace advice given to you by your health care provider. Make sure you discuss any questions you have with your health care provider. Document Revised: 04/03/2023 Document Reviewed: 04/03/2023 Elsevier Patient Education  2025 [employer].

## 2023-10-08 NOTE — Assessment & Plan Note (Signed)
 Diet and nutrition discussed Advised to decrease amount of daily carbohydrate intake and daily calories and increase amount of plant-based protein in her diet. Recommend to increase Mounjaro  to 10 mg weekly

## 2023-10-08 NOTE — Progress Notes (Signed)
 Kendra Nguyen 43 y.o.   Chief Complaint  Patient presents with   Numbness    Patient here for bilateral pain and numbness in legs and feet. She states her toes been cold, no tenderness, no redness. She is concerned of blood clot and believes it may be her neuropathy. Patient did mention having a slight fall and landed on her right side butt, leg 6 months ago and still having pain when she is walking or standing long. Patient also mentions some left arm aches and pain. She took Tylenol  on Saturday and says it did help      HISTORY OF PRESENT ILLNESS: This is a 43 y.o. female with history of hereditary peripheral neuropathy for at least 20 years complaining of occasional intermittent numbness and tingling in legs and feet for the last several weeks Also history of diabetes and morbid obesity on Mounjaro  7.5 mg weekly.  Losing some weight but at a slower pace. No other complaints or medical concerns today.  HPI   Prior to Admission medications   Medication Sig Start Date End Date Taking? Authorizing Provider  tirzepatide  (MOUNJARO ) 10 MG/0.5ML Pen Inject 10 mg into the skin once a week. 10/08/23  Yes Purcell Emil Schanz, MD  ferrous sulfate  325 (65 FE) MG tablet Take 325 mg by mouth daily with breakfast. Patient not taking: Reported on 07/02/2023    [provider]    Allergies  Allergen Reactions   Metformin Hcl Er Diarrhea   Tape     rash   Neomycin Rash and Other (See Comments)   Other Itching and Rash   Wound Dressing Adhesive Rash    Patient Active Problem List   Diagnosis Date Noted   Type 2 diabetes mellitus with hyperglycemia, without long-term current use of insulin  (HCC) 10/31/2020   History of PCOS 05/24/2020   Hereditary and idiopathic peripheral neuropathy 06/07/2014   Pure hypercholesterolemia 12/15/2013   Vitamin D  deficiency 12/15/2013   Morbid obesity (HCC) 03/21/2011    Past Medical History:  Diagnosis Date   Anemia    Anxiety    Depression     Diabetes (HCC)    Edema of both lower extremities    Hx of laparoscopic gastric banding    Hyperlipemia    Hypertension    was taken of HTN meds by PCP years ago   Morbid obesity with BMI of 45.0-49.9, adult (HCC)    Obesity    OCD (obsessive compulsive disorder) 04/2016   PCOS (polycystic ovarian syndrome)    Varicose veins    Vitamin D  deficiency     Past Surgical History:  Procedure Laterality Date   CESAREAN SECTION N/A 01/18/2016   Procedure: CESAREAN SECTION;  Surgeon: Jon Rummer, MD;  Location: [facility];  Service: Obstetrics;  Laterality: N/A;   CESAREAN SECTION N/A 10/08/2021   Procedure: CESAREAN SECTION;  Surgeon: Rummer Jon, MD;  Location: [facility] LD ORS;  Service: Obstetrics;  Laterality: N/A;   COLONOSCOPY WITH PROPOFOL  N/A 11/24/2014   Procedure: COLONOSCOPY WITH PROPOFOL ;  Surgeon: Toribio SHAUNNA Cedar, MD;  Location: [facility] END[facility];  Service: Endoscopy;  Laterality: N/A;   LAPAROSCOPIC GASTRIC BANDING  04/17/2011   LAPAROSCOPIC GASTRIC SLEEVE RESECTION     2016   WISDOM TOOTH EXTRACTION      Social History   Socioeconomic History   Marital status: Married    Spouse name: Not on file   Number of children: Not on file   Years of education: Master's   Highest education  level: Master's degree (e.g., MA, MS, MEng, MEd, MSW, MBA)  Occupational History   Occupation: Runner, 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   Caffeine use: Soda occass jereld)   Engaged   Social Drivers of Health   Financial Resource Strain: Medium Risk (10/08/2023)   Overall Financial Resource Strain (CARDIA)    Difficulty of Paying Living Expenses: Somewhat hard  Food Insecurity:  No Food Insecurity (10/08/2023)   Hunger Vital Sign    Worried About Running Out of Food in the Last Year: Never true    Ran Out of Food in the Last Year: Never true  Transportation Needs: No Transportation Needs (10/08/2023)   PRAPARE - Administrator, Civil Service (Medical): No    Lack of Transportation (Non-Medical): No  Physical Activity: Insufficiently Active (10/08/2023)   Exercise Vital Sign    Days of Exercise per Week: 1 day    Minutes of Exercise per Session: 40 min  Stress: Stress Concern Present (10/08/2023)   [employer] of Occupational Health - Occupational Stress Questionnaire    Feeling of Stress: Rather much  Social Connections: Socially Integrated (10/08/2023)   Social Connection and Isolation Panel    Frequency of Communication with Friends and Family: Three times a week    Frequency of Social Gatherings with Friends and Family: Never    Attends Religious Services: More than 4 times per year    Active Member of [employer] or Organizations: Yes    Attends Engineer, structural: More than 4 times per year    Marital Status: Married  Catering manager Violence: Not on file    Family History  Problem Relation Age of Onset   Diabetes Mother    Heart disease Mother    Obesity Mother    Hypertension Father    Arthritis Father        gout   COPD Father        smoker   Hyperlipidemia Father    Cancer Father        stomach   High blood pressure Father    Sleep apnea Father    Hypertension Sister    Aneurysm Sister 34       cerebral;    COPD Paternal Aunt    Heart disease Maternal Grandmother    Heart disease Maternal Grandfather    Neuropathy Neg Hx    Asthma Neg Hx    Stroke Neg Hx      Review of Systems  Constitutional: Negative.  Negative for chills and fever.  HENT: Negative.  Negative for congestion.   Respiratory: Negative.  Negative for cough and hemoptysis.   Gastrointestinal:  Negative for abdominal pain, diarrhea, nausea and  vomiting.  Genitourinary: Negative.  Negative for dysuria and urgency.  Skin: Negative.  Negative for rash.  Neurological:  Positive for sensory change. Negative for dizziness and headaches.  All other systems reviewed and are negative.   Vitals:   10/08/23 0838  BP: 128/84  Pulse: 99  Temp: 97.9 F (36.6 C)  SpO2: 98%    Physical Exam Vitals reviewed.  Constitutional:  Appearance: Normal appearance. She is obese.  HENT:     Head: Normocephalic.  Eyes:     Extraocular Movements: Extraocular movements intact.  Cardiovascular:     Rate and Rhythm: Normal rate.     Pulses: Normal pulses.  Skin:    General: Skin is warm and dry.  Neurological:     Mental Status: She is alert and oriented to person, place, and time.  Psychiatric:        Mood and Affect: Mood normal.        Behavior: Behavior normal.      ASSESSMENT & PLAN: A total of 42 minutes was spent with the patient and counseling/coordination of care regarding preparing for this visit, review of most recent office visit notes, review of multiple chronic medical conditions and their management, review of all medications, review of most recent bloodwork results, review of health maintenance items, education on nutrition, prognosis, documentation, and need for follow up.   Problem List Items Addressed This Visit       Endocrine   Type 2 diabetes mellitus with hyperglycemia, without long-term current use of insulin  (HCC)   Diet and nutrition discussed Blood work done today including hemoglobin A1c Recommend to increase Mounjaro  to 10 mg weekly and monitor side effects closely.  Advised to contact the office if not tolerable. Cardiovascular risks associated with uncontrolled diabetes and obesity discussed Follow-up in 3 months      Relevant Medications   tirzepatide  (MOUNJARO ) 10 MG/0.5ML Pen   Other Relevant Orders   Microalbumin / creatinine urine ratio     Nervous and Auditory   Hereditary and idiopathic  peripheral neuropathy - Primary   Currently active and affecting quality of life Needs follow-up with neurologist Recommend to start Cymbalta  30 mg daily      Relevant Medications   DULoxetine  (CYMBALTA ) 30 MG capsule   Other Relevant Orders   Ambulatory referral to Neurology     Other   Morbid obesity (HCC)   Diet and nutrition discussed Advised to decrease amount of daily carbohydrate intake and daily calories and increase amount of plant-based protein in her diet. Recommend to increase Mounjaro  to 10 mg weekly      Relevant Medications   tirzepatide  (MOUNJARO ) 10 MG/0.5ML Pen   Patient Instructions  Nerve Damage (Peripheral Neuropathy): What to Know Peripheral neuropathy happens when there's damage to the peripheral nerves. These nerves send signals between your spinal cord and your arms and legs. You can have damage in one or more nerves. What are the causes? There are many reasons why nerves can get damaged. Damage may be caused by some diseases, such as: Diabetes. This is the most common cause. Autoimmune diseases, such as rheumatoid arthritis or lupus. These happen when your body's defense system (immune system) attacks healthy parts of your body by mistake. Inherited nerve disease. This is passed down in families. Kidney disease. Thyroid disease. Other causes include: Pressure or stress on a nerve that lasts a long time. Lack of B vitamins from poor diet or drinking too much alcohol. Infections. Some medicines, like chemotherapy used for cancer treatment. Poisonous (toxic) substances, such as lead or mercury. Too little blood flowing to the legs. Sometimes, the cause isn't known. What are the signs or symptoms? Symptoms depend on which nerves are damaged. In your legs, hands, or arms, you may have: Loss of feeling (numbness). Tingling. Burning pain. Very sensitive skin. Weakness. Paralysis. This is when you can't move a part of your body. Clumsiness.  Muscle  twitching. Loss of balance. You may have trouble walking. Other symptoms can include: Not being able to control when you pee. Feeling dizzy. Problems during sex. How is this diagnosed? Your health care provider will: Ask about your symptoms and medical history. Do a physical exam. Do a neurological exam. They will check your reflexes, how you move, and what you can feel. You may need to have other tests, such as: Blood tests. Nerve tests to check how well your nerves and muscles work. Imaging tests, such as a CT scan or MRI. These are done to rule out other causes. Nerve biopsy. This is when a small piece of a nerve is removed for testing. Lumbar puncture. A small amount of the fluid that surrounds the brain and spinal cord is taken and checked in a lab. How is this treated? Treatment may include: Treating the cause of the nerve damage. Pain medicines. Other medicines that may help with pain, such as: Medicines that treat seizures. Medicines that treat depression. Pain patches or creams that are put on painful areas of skin. TENS therapy. This uses a device that sends mild electrical pulses to your nerves. This will prevent pain signals from reaching the brain. Massage. Acupuncture. Surgery to relieve pressure on a nerve or to destroy a nerve that's causing pain. This is done only if the other treatments are not helping. You may also need physical therapy to help improve your movement, balance, and muscle strength. You may be told to use a cane or walker if needed. Follow these instructions at home: Medicines Take your medicines only as told. You may need to take steps to help treat or prevent trouble pooping (constipation), such as: Taking medicines to help you poop. Eating foods high in fiber, like beans, whole grains, and fresh fruits and vegetables. Drinking more fluids as told. Ask your provider if it's safe to drive or use machines while taking your  medicine. Lifestyle  Do not smoke, vape, or use nicotine or tobacco. Avoid or limit alcohol. Too much alcohol can be harmful to nerves and cause damage. Eat a healthy diet. Safety  Take care to avoid burning or damaging your skin if you have numbness. If you have numbness in your feet: Check your feet each day for signs of injury or infection. Watch for redness, warmth, and swelling. Wear padded socks and comfortable shoes. These help protect your feet. General instructions If you have diabetes, keep your blood sugar under control. Develop a good support system. Living with nerve damage can cause a lot of stress. Talk with a mental health therapist or join a support group. Exercise as told. Ask what things are safe for you to do at home. Work with a pain specialist to find the best way to manage your pain. Keep all follow-up visits. Your provider will check if the treatments are working and change them if needed. Where to find support The Foundation for Peripheral Neuropathy: [employer]si Where to find more information To learn more, go to: [employer] of Neurological Disorders and Stroke at [employer]no. Click Search and type peripheral neuropathy. Find the link you need. Contact a health care provider if: Your pain treatments are not working. Your symptoms are getting worse. You have side effects from any of your medicines. You have new symptoms. You're having a hard time coping with your condition. Get help right away if: You have a wound that's not healing. You have new weakness in an arm or leg. You've fallen or  you fall often. This information is not intended to replace advice given to you by your health care provider. Make sure you discuss any questions you have with your health care provider. Document Revised: 04/03/2023 Document Reviewed: 04/03/2023 Elsevier Patient Education  2025 Elsevier Inc.     Emil Schaumann, MD [place]  [facility] at [facility]

## 2023-10-08 NOTE — Assessment & Plan Note (Signed)
 Currently active and affecting quality of life Needs follow-up with neurologist Recommend to start Cymbalta  30 mg daily

## 2023-10-08 NOTE — Assessment & Plan Note (Signed)
 Diet and nutrition discussed Blood work done today including hemoglobin A1c Recommend to increase Mounjaro  to 10 mg weekly and monitor side effects closely.  Advised to contact the office if not tolerable. Cardiovascular risks associated with uncontrolled diabetes and obesity discussed Follow-up in 3 months

## 2023-10-15 ENCOUNTER — Encounter: Payer: Self-pay | Admitting: Neurology

## 2023-12-12 ENCOUNTER — Ambulatory Visit

## 2023-12-12 DIAGNOSIS — Z111 Encounter for screening for respiratory tuberculosis: Secondary | ICD-10-CM

## 2023-12-12 NOTE — Progress Notes (Signed)
 Pt was here to receive her TB skin test in the right arm. Pt is to return on Monday for reading

## 2023-12-15 ENCOUNTER — Ambulatory Visit

## 2023-12-15 LAB — TB SKIN TEST
Induration: 0 mm
TB Skin Test: NEGATIVE

## 2023-12-15 NOTE — Progress Notes (Signed)
 Tuberculin skin test applied to right ventral forearm. Results negative with 0 mm measuring induration no erythema.

## 2023-12-31 NOTE — Addendum Note (Signed)
 Addended by: ROSALVA LEX RAMAN on: 12/31/2023 04:12 PM   Modules accepted: Orders

## 2024-01-07 ENCOUNTER — Ambulatory Visit: Admitting: Emergency Medicine

## 2024-01-12 ENCOUNTER — Ambulatory Visit: Admitting: Neurology

## 2024-04-13 ENCOUNTER — Ambulatory Visit: Admitting: Neurology
